# Patient Record
Sex: Female | Born: 1955 | Race: White | Hispanic: No | Smoking: Current every day smoker
Health system: Southern US, Community
[De-identification: ages and names within clinical notes are randomized; demographics above are authoritative.]

## PROBLEM LIST (undated history)

## (undated) DIAGNOSIS — J439 Emphysema, unspecified: Secondary | ICD-10-CM

## (undated) DIAGNOSIS — F1910 Other psychoactive substance abuse, uncomplicated: Secondary | ICD-10-CM

## (undated) DIAGNOSIS — F419 Anxiety disorder, unspecified: Secondary | ICD-10-CM

## (undated) DIAGNOSIS — J449 Chronic obstructive pulmonary disease, unspecified: Secondary | ICD-10-CM

## (undated) HISTORY — PX: BACK SURGERY: SHX140

## (undated) HISTORY — DX: Other psychoactive substance abuse, uncomplicated: F19.10

## (undated) HISTORY — PX: KNEE SURGERY: SHX244

## (undated) HISTORY — DX: Anxiety disorder, unspecified: F41.9

---

## 1998-01-22 ENCOUNTER — Ambulatory Visit (HOSPITAL_COMMUNITY): Admission: RE | Admit: 1998-01-22 | Discharge: 1998-01-22 | Payer: Self-pay | Admitting: Gastroenterology

## 2000-04-19 ENCOUNTER — Other Ambulatory Visit: Admission: RE | Admit: 2000-04-19 | Discharge: 2000-04-19 | Payer: Self-pay | Admitting: Obstetrics and Gynecology

## 2000-04-21 ENCOUNTER — Encounter: Payer: Self-pay | Admitting: Obstetrics and Gynecology

## 2000-04-21 ENCOUNTER — Encounter: Admission: RE | Admit: 2000-04-21 | Discharge: 2000-04-21 | Payer: Self-pay | Admitting: Obstetrics and Gynecology

## 2001-08-15 ENCOUNTER — Other Ambulatory Visit: Admission: RE | Admit: 2001-08-15 | Discharge: 2001-08-15 | Payer: Self-pay | Admitting: Obstetrics and Gynecology

## 2001-08-23 ENCOUNTER — Encounter: Admission: RE | Admit: 2001-08-23 | Discharge: 2001-08-23 | Payer: Self-pay | Admitting: Obstetrics and Gynecology

## 2001-08-23 ENCOUNTER — Encounter: Payer: Self-pay | Admitting: Obstetrics and Gynecology

## 2001-09-01 ENCOUNTER — Encounter (INDEPENDENT_AMBULATORY_CARE_PROVIDER_SITE_OTHER): Payer: Self-pay | Admitting: Specialist

## 2001-09-01 ENCOUNTER — Ambulatory Visit (HOSPITAL_COMMUNITY): Admission: RE | Admit: 2001-09-01 | Discharge: 2001-09-01 | Payer: Self-pay | Admitting: Obstetrics and Gynecology

## 2003-12-04 ENCOUNTER — Ambulatory Visit (HOSPITAL_COMMUNITY): Admission: RE | Admit: 2003-12-04 | Discharge: 2003-12-04 | Payer: Self-pay | Admitting: Family Medicine

## 2004-02-06 ENCOUNTER — Other Ambulatory Visit: Admission: RE | Admit: 2004-02-06 | Discharge: 2004-02-06 | Payer: Self-pay | Admitting: Obstetrics and Gynecology

## 2004-02-21 ENCOUNTER — Encounter: Admission: RE | Admit: 2004-02-21 | Discharge: 2004-02-21 | Payer: Self-pay | Admitting: Obstetrics and Gynecology

## 2007-04-10 ENCOUNTER — Encounter: Admission: RE | Admit: 2007-04-10 | Discharge: 2007-04-10 | Payer: Self-pay | Admitting: Family Medicine

## 2007-06-16 ENCOUNTER — Ambulatory Visit (HOSPITAL_COMMUNITY): Admission: RE | Admit: 2007-06-16 | Discharge: 2007-06-16 | Payer: Self-pay | Admitting: Orthopaedic Surgery

## 2008-05-06 ENCOUNTER — Emergency Department (HOSPITAL_COMMUNITY): Admission: EM | Admit: 2008-05-06 | Discharge: 2008-05-06 | Payer: Self-pay | Admitting: Emergency Medicine

## 2008-07-16 ENCOUNTER — Emergency Department (HOSPITAL_COMMUNITY): Admission: EM | Admit: 2008-07-16 | Discharge: 2008-07-16 | Payer: Self-pay | Admitting: Emergency Medicine

## 2010-09-29 LAB — DIFFERENTIAL
Basophils Relative: 1 % (ref 0–1)
Eosinophils Absolute: 0.2 10*3/uL (ref 0.0–0.7)
Eosinophils Relative: 3 % (ref 0–5)
Lymphocytes Relative: 30 % (ref 12–46)
Lymphs Abs: 2.4 10*3/uL (ref 0.7–4.0)
Monocytes Absolute: 0.6 10*3/uL (ref 0.1–1.0)
Neutro Abs: 4.8 10*3/uL (ref 1.7–7.7)

## 2010-09-29 LAB — POCT I-STAT, CHEM 8
BUN: 6 mg/dL (ref 6–23)
Chloride: 103 mEq/L (ref 96–112)
Creatinine, Ser: 0.7 mg/dL (ref 0.4–1.2)
Glucose, Bld: 104 mg/dL — ABNORMAL HIGH (ref 70–99)
HCT: 47 % — ABNORMAL HIGH (ref 36.0–46.0)
Hemoglobin: 16 g/dL — ABNORMAL HIGH (ref 12.0–15.0)
Potassium: 3.9 mEq/L (ref 3.5–5.1)
Sodium: 137 mEq/L (ref 135–145)
TCO2: 25 mmol/L (ref 0–100)

## 2010-09-29 LAB — CBC
MCV: 94.9 fL (ref 78.0–100.0)
Platelets: 235 10*3/uL (ref 150–400)

## 2010-09-29 LAB — POCT CARDIAC MARKERS: Myoglobin, poc: 45 ng/mL (ref 12–200)

## 2010-10-27 NOTE — Op Note (Signed)
Darlene [name], [name]            ACCOUNT NO.:  [id number]   MEDICAL RECORD NO.:  [id number]          PATIENT TYPE:  AMB   LOCATION:  SDS                          FACILITY:  [facility]   PHYSICIAN:  Vanita Panda. Magnus Ivan, M.D.DATE OF BIRTH:  1956-02-01   DATE OF PROCEDURE:  06/16/2007  DATE OF DISCHARGE:                               OPERATIVE REPORT   PREOPERATIVE DIAGNOSIS:  Right knee internal derangement with likely  medial meniscal tear.   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   PROCEDURE:  1. Right knee arthroscopy with debridement.  2. Right knee partial lateral meniscectomy.  3. Chondroplasty medial and lateral femoral condyles right knee.   SURGEON:  Doneen Poisson, MD   ANESTHESIA:  General.   TOURNIQUET TIME:  Thirty-nine minutes.   BLOOD LOSS:  Minimal.   COMPLICATIONS:  None.   INDICATIONS:  Ms. Darlene [name] is a 55 year old with two previous knee  arthroscopies on her right knee and she developed a locked knee at some  point early last week and she could not fully extend it.  She was seen  in the clinic and agreed that she did have a knee that I could not fully  extend.  I was concerned that it was more of a medial meniscal tear.  She wished to proceed with arthroscopic surgery and we set it up for  this week because she could not be accommodated last week per the  patient.  Risks, benefits of surgery were explained to her and well  understood.  She agreed to proceed with surgery.   PROCEDURE:  Informed consent was obtained, the appropriate right leg was  marked.  She was brought to the operating room and placed supine on the  operating table.  General anesthesia was then obtained and a nonsterile  tourniquet was placed on her upper right thigh and leg was prepped and  draped with DuraPrep and sterile drapes including a sterile stockinette.  A lateral leg post was  utilizing with the bed raised.  The leg was  flexed off the side of the table.  A time-out was called and she was  identified as the correct patient and correct extremity.  I then made an  anterior lateral portal just off the inferior pole of the patella  lateral to the patellar tendon and arthroscopic cannula was inserted and  a large effusion was encountered.  I then inserted the arthroscope into  the knee and right away made a medial portal.  You could see that there  was grade 3 chondromalacia of the medial femoral condyle.  The medial  meniscus was completely debrided away from the previous surgeries.  I  probed this and there was nothing blocking the joint.  I then went to  the middle of the knee at the notch and the ACL pretty much had mucoid  degeneration.  You could see though however, there was a large posterior  bucket-handle tear of the lateral meniscus that was blocking the joint.  With the knee then brought into figure four position I  made a separate  more anterior type of incision to manage the scope and through the  lateral incision used up cutting biters as well as a shaver and  performed a partial lateral meniscectomy with removing most of the  posterior aspect of the lateral meniscus.  I then put the knee through  range of motion.  It was found to be stable and there was no block to  the motion.  There was finally in the patellofemoral joint there was  minimal degenerative changes.  All instrumentation was removed and I  allowed fluid to lavage through the knee.  I then drained the knee of  any excess fluid, closed the portal sites with interrupted 4-0 nylon  suture and placed a Marcaine morphine injection into the knee as well as  the portal sites.  Xeroform followed by well-padded sterile dressing and  Ace wrap were applied.  Tourniquet was let down at 39 minutes.  The toes  did pink up nicely.  The patient was awakened, extubated and taken to  recovery room in stable  condition.  All final counts were correct.  There were no complications noted.      Vanita Panda. Magnus Ivan, M.D.  Electronically Signed     CYB/MEDQ  D:  06/16/2007  T:  06/16/2007  Job:  [id number]

## 2010-10-30 NOTE — Op Note (Signed)
[facility]  Patient:    Darlene [name], Darlene [name] Visit Number: [id number] MRN: [id number]          Service Type: DSU Location: [facility] Attending Physician:  Jenean Lindau Dictated by:   Laqueta Linden, M.D. Proc. Date: 09/01/00 Admit Date:  09/01/2001                             Operative Report  PREOPERATIVE DIAGNOSES:       Dysfunctional uterine bleeding, endometrial polyp.  POSTOPERATIVE DIAGNOSES:      Dysfunctional uterine bleeding, endometrial polyp.  PROCEDURE:                    Hysteroscopic resection.  SURGEON:                      Laqueta Linden, M.D.  ANESTHESIA:                   General LMA.  ESTIMATED BLOOD LOSS:         Less than 10 cc.  FLUIDS:                       Sorbitol net intake 50 cc.  COMPLICATIONS:                None.  INDICATIONS:                  Darlene [name] is a 55 year old female with a greater than one year history of menometrorrhagia.  She was noted on sonohysterogram greater than one year ago to have multiple endometrial polyps but she deferred treatment of this.  She has continued to have erratic and heavy bleeding.  She underwent follow-up ultrasound with sonohysterogram which revealed a 4-5 mm anterior fundal endometrial polyp with a second polyp versus synechiae.  The patient was offered alternatives of hormonal management with progesterone therapy versus resection and elected to proceed to resection. She is aware that at her age there may be hormonal issues contributing to her dysfunctional uterine bleeding and she may require hormonal cycling as well. She has seen the informed consent film.  Been counseled as to the risks, benefits, alternatives, and complications and agrees to proceed.  PROCEDURE:                    The patient was taken to the operating room and after proper identification and consents were ascertained, she was placed on the operating table in supine position.  After general LMA  anesthesia was introduced, she was placed in the [place] stirrups and the perineum and vagina were prepped and draped in a routine sterile fashion.  A transurethral Foley was placed which was removed at the conclusion of the procedure.  Bimanual examination confirmed an anterior, mobile, normal sized uterus.  A speculum was placed in the vagina and the anterior lip of the cervix grasped with a single tooth tenaculum.  The endometrial cavity sounded to 8 cm.  The internal os was gently dilated to a #33 Pratt dilator.  The resectoscope with continuous video and Sorbitol infusion was then inserted under direct vision. The endocervical canal was free of lesions.  Both cornua were visualized. There were posterior synechiae visualized.  There was no clear cut polyp visualized, although the anterior fundus did appear slightly thicker than the remainder of the cavity.  A resectoscope was  placed on routine settings and all walls of the endometrial cavity were resected in strips.  Several small bleeding points were cauterized.  There was no active bleeding noted.  All specimens were sent to pathology.  The instruments were removed.  The tenaculum site was hemostatic.  There was no obvious bleeding from the cervix. Estimated blood loss was less than 10 cc.  Sorbitol net intake 50 cc. Complications:  None.  Patient was stable and extubated on transfer to the recovery room.  She will be observed at discharge per anesthesia protocol. She is to take Advil or Aleve as needed for any cramping and to follow up in the office as scheduled on October 03, 2001.  She is to call prior to that time for excessive pain, fever, bleeding, or other concerns. Dictated by:   Laqueta Linden, M.D. Attending Physician:  Jenean Lindau DD:  09/01/01 TD:  09/04/01 Job: [id number]

## 2011-03-19 LAB — BASIC METABOLIC PANEL
BUN: 7
Chloride: 105
Creatinine, Ser: 0.57
GFR calc non Af Amer: >60
Glucose, Bld: 99

## 2011-03-19 LAB — CBC
Hemoglobin: 15.1 — ABNORMAL HIGH
MCV: 97.2
RBC: 4.6
RDW: 12.7

## 2012-03-14 ENCOUNTER — Emergency Department (HOSPITAL_COMMUNITY)
Admission: EM | Admit: 2012-03-14 | Discharge: 2012-03-14 | Disposition: A | Discharge status: Home / Self Care | Payer: BC Managed Care – PPO | Attending: Emergency Medicine | Admitting: Emergency Medicine

## 2012-03-14 ENCOUNTER — Encounter (HOSPITAL_COMMUNITY): Payer: Self-pay | Admitting: Emergency Medicine

## 2012-03-14 DIAGNOSIS — Z885 Allergy status to narcotic agent status: Secondary | ICD-10-CM | POA: Insufficient documentation

## 2012-03-14 DIAGNOSIS — Z79899 Other long term (current) drug therapy: Secondary | ICD-10-CM | POA: Insufficient documentation

## 2012-03-14 DIAGNOSIS — R109 Unspecified abdominal pain: Secondary | ICD-10-CM

## 2012-03-14 DIAGNOSIS — J438 Other emphysema: Secondary | ICD-10-CM | POA: Insufficient documentation

## 2012-03-14 DIAGNOSIS — F17200 Nicotine dependence, unspecified, uncomplicated: Secondary | ICD-10-CM | POA: Insufficient documentation

## 2012-03-14 DIAGNOSIS — R1013 Epigastric pain: Secondary | ICD-10-CM | POA: Insufficient documentation

## 2012-03-14 HISTORY — DX: Chronic obstructive pulmonary disease, unspecified: J44.9

## 2012-03-14 HISTORY — DX: Emphysema, unspecified: J43.9

## 2012-03-14 LAB — COMPREHENSIVE METABOLIC PANEL
ALT: 12 U/L (ref 0–35)
AST: 20 U/L (ref 0–37)
Albumin: 3.9 g/dL (ref 3.5–5.2)
Alkaline Phosphatase: 72 U/L (ref 39–117)
BUN: 5 mg/dL — ABNORMAL LOW (ref 6–23)
CO2: 28 mEq/L (ref 19–32)
Calcium: 9.4 mg/dL (ref 8.4–10.5)
Chloride: 97 mEq/L (ref 96–112)
Creatinine, Ser: 0.58 mg/dL (ref 0.50–1.10)
GFR calc Af Amer: >90 mL/min (ref >90)
GFR calc non Af Amer: >90 mL/min (ref >90)
Glucose, Bld: 108 mg/dL — ABNORMAL HIGH (ref 70–99)
Potassium: 3.5 mEq/L (ref 3.5–5.1)
Sodium: 136 mEq/L (ref 135–145)
Total Bilirubin: 1.5 mg/dL — ABNORMAL HIGH (ref 0.3–1.2)
Total Protein: 7.1 g/dL (ref 6.0–8.3)

## 2012-03-14 LAB — CBC
HCT: 49.5 % — ABNORMAL HIGH (ref 36.0–46.0)
Hemoglobin: 17.4 g/dL — ABNORMAL HIGH (ref 12.0–15.0)
MCH: 33.4 pg (ref 26.0–34.0)
MCHC: 35.2 g/dL (ref 30.0–36.0)
MCV: 95 fL (ref 78.0–100.0)
Platelets: 232 10*3/uL (ref 150–400)
RBC: 5.21 MIL/uL — ABNORMAL HIGH (ref 3.87–5.11)
RDW: 12.5 % (ref 11.5–15.5)
WBC: 8.4 10*3/uL (ref 4.0–10.5)

## 2012-03-14 LAB — TROPONIN I: Troponin I: <0.3 ng/mL (ref <0.30)

## 2012-03-14 LAB — LIPASE, BLOOD: Lipase: 16 U/L (ref 11–59)

## 2012-03-14 MED ORDER — PANTOPRAZOLE SODIUM 40 MG IV SOLR
40.0000 mg | Freq: Once | INTRAVENOUS | Status: AC
  Administered 2012-03-14: 40 mg via INTRAVENOUS
  Filled 2012-03-14: qty 40

## 2012-03-14 MED ORDER — SODIUM CHLORIDE 0.9 % IV BOLUS (SEPSIS)
1000.0000 mL | Freq: Once | INTRAVENOUS | Status: AC
  Administered 2012-03-14: 1000 mL via INTRAVENOUS

## 2012-03-14 MED ORDER — PANTOPRAZOLE SODIUM 40 MG PO TBEC: 40.0000 mg | DELAYED_RELEASE_TABLET | Freq: Every day | ORAL | Status: DC

## 2012-03-14 MED ORDER — ONDANSETRON HCL 4 MG/2ML IJ SOLN
4.0000 mg | Freq: Once | INTRAMUSCULAR | Status: AC
  Administered 2012-03-14: 4 mg via INTRAVENOUS
  Filled 2012-03-14: qty 2

## 2012-03-14 MED ORDER — HYDROMORPHONE HCL PF 1 MG/ML IJ SOLN
0.5000 mg | Freq: Once | INTRAMUSCULAR | Status: AC
  Administered 2012-03-14: 0.5 mg via INTRAVENOUS
  Filled 2012-03-14: qty 1

## 2012-03-14 NOTE — ED Notes (Signed)
Pt states that she has been having mid back pain that radiates to her stomach.  Has been having this pain for 4 or 5 weeks but it has gotten worse since Friday.  States she is nauseated and not able to eat.

## 2012-03-19 NOTE — ED Provider Notes (Signed)
History    56 year old female with abdominal pain. Epigastric with radiation to her back. Onset about 4-5 weeks ago but acutely worsens Friday. Nausea but no vomiting. Patient states that the pain is worse shortly after eating. No other appreciable exacerbating relieving factors. No urinary complaints. Denies singificant ETOH. Is a smoker.   CSN: [id number]  Arrival date & time 03/14/12  1053   First MD Initiated Contact with Patient 03/14/12 1110      Chief Complaint  Patient presents with  . Back Pain  . Abdominal Pain    (Consider location/radiation/quality/duration/timing/severity/associated sxs/prior treatment) HPI  Past Medical History  Diagnosis Date  . COPD (chronic obstructive pulmonary disease)   . Emphysema     Past Surgical History  Procedure Date  . Back surgery   . Knee surgery     History reviewed. No pertinent family history.  History  Substance Use Topics  . Smoking status: Current Every Day Smoker -- 2.0 packs/day  . Smokeless tobacco: Not on file  . Alcohol Use: 1.8 oz/week    3 Cans of beer per week    OB History    Grav Para Term Preterm Abortions TAB SAB Ect Mult Living                  Review of Systems   Review of symptoms negative unless otherwise noted in HPI.   Allergies  Codeine  Home Medications   Current Outpatient Rx  Name Route Sig Dispense Refill  . ALBUTEROL SULFATE HFA 108 (90 BASE) MCG/ACT IN AERS Inhalation Inhale 2 puffs into the lungs every 6 (six) hours as needed. For shortness of breath.    [place] IN Inhalation Inhale 2 puffs into the lungs 2 (two) times daily.    Marland Kitchen PANTOPRAZOLE SODIUM 40 MG PO TBEC Oral Take 1 tablet (40 mg total) by mouth daily. 30 tablet 0    BP 121/78  Pulse 96  Temp 98.5 F (36.9 C) (Oral)  Resp 26  SpO2 93%  Physical Exam  Nursing note and vitals reviewed. Constitutional: She appears well-developed and well-nourished. No distress.       Laying in bed. No acute distress.    HENT:  Head: Normocephalic and atraumatic.  Eyes: Conjunctivae normal are normal. Right eye exhibits no discharge. Left eye exhibits no discharge.  Neck: Neck supple.  Cardiovascular: Normal rate, regular rhythm and normal heart sounds.  Exam reveals no gallop and no friction rub.   No murmur heard. Pulmonary/Chest: Effort normal and breath sounds normal. No respiratory distress.  Abdominal: Soft. She exhibits no distension. There is tenderness.       Mild epigastric tenderness without rebound or guarding. No distention. No palpable mass.  Genitourinary:       No costovertebral angle tenderness  Musculoskeletal: She exhibits no edema and no tenderness.  Neurological: She is alert.  Skin: Skin is warm and dry.  Psychiatric: She has a normal mood and affect. Her behavior is normal. Thought content normal.    ED Course  Procedures (including critical care time)  Labs Reviewed  CBC - Abnormal; Notable for the following:    RBC 5.21 (*)     Hemoglobin 17.4 (*)     HCT 49.5 (*)     All other components within normal limits  COMPREHENSIVE METABOLIC PANEL - Abnormal; Notable for the following:    Glucose, Bld 108 (*)     BUN 5 (*)     Total Bilirubin 1.5 (*)  All other components within normal limits  LIPASE, BLOOD  TROPONIN I  LAB REPORT - SCANNED   No results found.  EKG:  Rhythm: Normal sinus Rate: 98 Axis: Normal Intervals/conduction: Left atrial enlargement ST segments: Normal   1. Abdominal pain       MDM  56 year old female with abdominal pain. Epigastric. Mild tenderness on palpation. Very low suspicion for emergent surgical process. Imaging was deferred. Consider anginal equivalent but doubt. EKG is non-suggestive. Troponin is normal.. Typical symptoms. Lipase is normal. Patient reports improved symptoms with medications the ER. She is afebrile and hemodynamically stable. Feel she is safe and appropriate for discharge. Will give a prescription for a proton  pump inhibitor for possible gastritis her peptic ulcer. Return precautions were discussed. Outpatient followup otherwise.        Raeford Razor, MD 03/19/12 [phone]

## 2012-04-03 ENCOUNTER — Other Ambulatory Visit: Payer: Self-pay | Admitting: Gastroenterology

## 2012-04-03 DIAGNOSIS — R109 Unspecified abdominal pain: Secondary | ICD-10-CM

## 2012-04-10 ENCOUNTER — Ambulatory Visit
Admission: RE | Admit: 2012-04-10 | Discharge: 2012-04-10 | Disposition: A | Discharge status: Home / Self Care | Payer: BC Managed Care – PPO | Source: Ambulatory Visit | Attending: Gastroenterology | Admitting: Gastroenterology

## 2012-04-10 DIAGNOSIS — R109 Unspecified abdominal pain: Secondary | ICD-10-CM

## 2012-04-10 MED ORDER — IOHEXOL 300 MG/ML  SOLN
100.0000 mL | Freq: Once | INTRAMUSCULAR | Status: AC | PRN
  Administered 2012-04-10: 100 mL via INTRAVENOUS

## 2012-04-24 ENCOUNTER — Other Ambulatory Visit: Payer: Self-pay | Admitting: Gastroenterology

## 2012-04-24 DIAGNOSIS — R109 Unspecified abdominal pain: Secondary | ICD-10-CM

## 2012-04-27 ENCOUNTER — Inpatient Hospital Stay: Admission: RE | Admit: 2012-04-27 | Payer: BC Managed Care – PPO | Source: Ambulatory Visit

## 2012-05-12 ENCOUNTER — Ambulatory Visit
Admission: RE | Admit: 2012-05-12 | Discharge: 2012-05-12 | Disposition: A | Discharge status: Home / Self Care | Payer: BC Managed Care – PPO | Source: Ambulatory Visit | Attending: Gastroenterology | Admitting: Gastroenterology

## 2012-05-12 DIAGNOSIS — R109 Unspecified abdominal pain: Secondary | ICD-10-CM

## 2012-12-29 ENCOUNTER — Ambulatory Visit (INDEPENDENT_AMBULATORY_CARE_PROVIDER_SITE_OTHER)
Admission: RE | Admit: 2012-12-29 | Discharge: 2012-12-29 | Disposition: A | Discharge status: Home / Self Care | Payer: BC Managed Care – PPO | Source: Ambulatory Visit | Attending: Internal Medicine | Admitting: Internal Medicine

## 2012-12-29 ENCOUNTER — Ambulatory Visit (INDEPENDENT_AMBULATORY_CARE_PROVIDER_SITE_OTHER): Payer: BC Managed Care – PPO | Admitting: Internal Medicine

## 2012-12-29 ENCOUNTER — Encounter: Payer: Self-pay | Admitting: Internal Medicine

## 2012-12-29 VITALS — BP 148/90 | HR 97 | Temp 98.2°F | Ht 63.5 in | Wt 92.6 lb

## 2012-12-29 DIAGNOSIS — J441 Chronic obstructive pulmonary disease with (acute) exacerbation: Secondary | ICD-10-CM

## 2012-12-29 DIAGNOSIS — J449 Chronic obstructive pulmonary disease, unspecified: Secondary | ICD-10-CM | POA: Insufficient documentation

## 2012-12-29 DIAGNOSIS — F17200 Nicotine dependence, unspecified, uncomplicated: Secondary | ICD-10-CM

## 2012-12-29 NOTE — Progress Notes (Signed)
  Subjective:    Patient ID: Darlene [name], female    DOB: [birth year], 57 y.o.   MRN: [id number]  HPI  35 yowf smoker dx with remote dx copd  By Clance self referred to pulmonary clinic 12/29/2012 with progressive doe x 06/2012   12/29/2012 1st pulmonary eval in EPIC era / Aya Geisel  cc indolent onset progressive doe x 6 month getting groceries from car to [employer],  crossing parking lots. In am lots watery nasal congestion and cough > 30 min each am, assoc with chest discomfort midline during cough, prod sev tbsp white mucus,, some better p saba uses 4-5 x day.     No obvious daytime or seasonal variabilty or assoc  Ex or lateralizing pleuritic cp or chest tightness, subjective wheeze overt sinus or hb symptoms. No unusual exp hx or h/o childhood pna/ asthma or knowledge of premature birth.    Sleeping ok without nocturnal  or early am exacerbation  of respiratory  c/o's or need for noct saba. Also denies any obvious fluctuation of symptoms with weather or environmental changes or other aggravating or alleviating factors except as outlined above     Review of Systems  Constitutional: Negative for fever, chills and unexpected weight change.  HENT: Positive for congestion. Negative for ear pain, nosebleeds, sore throat, rhinorrhea, sneezing, trouble swallowing, dental problem, voice change, postnasal drip and sinus pressure.   Eyes: Negative for visual disturbance.  Respiratory: Positive for cough and shortness of breath. Negative for choking.   Cardiovascular: Positive for chest pain. Negative for leg swelling.  Gastrointestinal: Positive for abdominal pain. Negative for vomiting and diarrhea.  Genitourinary: Negative for difficulty urinating.  Musculoskeletal: Negative for arthralgias.  Skin: Negative for rash.  Neurological: Negative for tremors, syncope and headaches.  Hematological: Does not bruise/bleed easily.       Objective:   Physical Exam   amb wf nad Wt Readings from Last 3  Encounters:  12/29/12 92 lb 9.6 oz (42.003 kg)     HEENT mild turbinate edema.  Oropharynx no thrush or excess pnd or cobblestoning.  No JVD or cervical adenopathy. Mild accessory muscle hypertrophy. Trachea midline, nl thryroid. Chest was hyperinflated by percussion with diminished breath sounds and moderate increased exp time without wheeze. Hoover sign positive at mid inspiration. Regular rate and rhythm without murmur gallop or rub or increase P2 or edema.  Abd: no hsm, nl excursion. Ext warm without cyanosis or clubbing.    CXR  12/29/2012 :  1. Chronic changes of COPD redemonstrated, as above, without radiographic evidence of acute cardiopulmonary disease.       Assessment & Plan:

## 2012-12-29 NOTE — Patient Instructions (Addendum)
Continue symbicort 160 Take 2 puffs first thing in am and then another 2 puffs about 12 hours later.    Only use your albuterol as a rescue medication to be used if you can't catch your breath by resting or doing a relaxed purse lip breathing pattern. The less you use it, the better it will work when you need it. Ok to use up to 2 puffs every 4 hours  Work on inhaler technique:  relax and gently blow all the way out then take a nice smooth deep breath back in, triggering the inhaler at same time you start breathing in.  Hold for up to 5 seconds if you can.  Rinse and gargle with water when done   If your mouth or throat starts to bother you,   I suggest you time the inhaler to your dental care and after using the inhaler(s) brush teeth and tongue with a baking soda containing toothpaste and when you rinse this out, gargle with it first to see if this helps your mouth and throat.    The key is to stop smoking completely before smoking completely stops you!   Please remember to go to the  x-ray department downstairs for your tests - we will call you with the results when they are available.     Please schedule a follow up office visit in 4 weeks, sooner if needed with pft's

## 2012-12-30 DIAGNOSIS — F17210 Nicotine dependence, cigarettes, uncomplicated: Secondary | ICD-10-CM | POA: Insufficient documentation

## 2012-12-30 NOTE — Assessment & Plan Note (Addendum)
Old chart not available as not seen in EMR era  but needs fresh pft's and alpha one testing if not already done  DDX of  difficult airways managment all start with A and  include Adherence, Ace Inhibitors, Acid Reflux, Active Sinus Disease, Alpha 1 Antitripsin deficiency, Anxiety masquerading as Airways dz,  ABPA,  allergy(esp in young), Aspiration (esp in elderly), Adverse effects of DPI,  Active smokers, plus two Bs  = Bronchiectasis and Beta blocker use..and one C= CHF  Adherence is always the initial "prime suspect" and is a multilayered concern that requires a "trust but verify" approach in every patient - starting with knowing how to use medications, especially inhalers, correctly, keeping up with refills and understanding the fundamental difference between maintenance and prns vs those medications only taken for a very short course and then stopped and not refilled. The proper method of use, as well as anticipated side effects, of a metered-dose inhaler are discussed and demonstrated to the patient. Improved effectiveness after extensive coaching during this visit to a level of approximately  75% so try first symbicort 160 2bid to see if reduce dependency on saba  Active smoking clearly the other big concern> discussed separately  Alpha one > check old chart and send studies next ov if not done    Each maintenance medication was reviewed in detail including most importantly the difference between maintenance and as needed and under what circumstances the prns are to be used.  Please see instructions for details which were reviewed in writing and the patient given a copy.

## 2012-12-30 NOTE — Assessment & Plan Note (Signed)

## 2013-01-02 ENCOUNTER — Encounter: Payer: Self-pay | Admitting: Internal Medicine

## 2013-01-03 NOTE — Progress Notes (Signed)
Quick Note:  Spoke with pt's spouse and notified of results per Dr. Sherene Sires. He verbalized understanding and denied any questions.  ______

## 2013-02-02 ENCOUNTER — Ambulatory Visit (INDEPENDENT_AMBULATORY_CARE_PROVIDER_SITE_OTHER): Payer: BC Managed Care – PPO | Admitting: Internal Medicine

## 2013-02-02 ENCOUNTER — Encounter: Payer: Self-pay | Admitting: Internal Medicine

## 2013-02-02 ENCOUNTER — Other Ambulatory Visit: Payer: BC Managed Care – PPO

## 2013-02-02 VITALS — BP 124/80 | HR 94 | Temp 98.1°F | Ht 63.0 in | Wt 91.0 lb

## 2013-02-02 DIAGNOSIS — J449 Chronic obstructive pulmonary disease, unspecified: Secondary | ICD-10-CM

## 2013-02-02 NOTE — Patient Instructions (Addendum)
Continue symbicort 160 Take 2 puffs first thing in am and then another 2 puffs about 12 hours later.   Only use your albuterol as a rescue medication to be used if you can't catch your breath by resting or doing a relaxed purse lip breathing pattern. The less you use it, the better it will work when you need it.   The key is to stop smoking completely before smoking completely stops you - it's the most important aspect of your care  Please remember to go to the lab  department downstairs for your tests - we will call you with the results when they are available.     Please schedule a follow up visit in 3 months but call sooner if needed

## 2013-02-02 NOTE — Progress Notes (Signed)
PFT done today. 

## 2013-02-02 NOTE — Progress Notes (Signed)
  Subjective:    Patient ID: Darlene [name], female    DOB: 09-Sep-1955    MRN: [id number]    Brief patient profile:  16 yowf smoker dx with remote dx copd  By Clance self referred to pulmonary clinic 12/29/2012 with progressive doe x 06/2012 dx with GOLD II/III COPD 02/02/13      HPI 12/29/2012 1st pulmonary eval in EPIC era / Darlene [name]  cc indolent onset progressive doe x 6 month getting groceries from car to [employer],  crossing parking lots. In am lots watery nasal congestion and cough > 30 min each am, assoc with chest discomfort midline during cough, prod sev tbsp white mucus,, some better p saba uses 4-5 x day.   rec Continue symbicort 160 Take 2 puffs first thing in am and then another 2 puffs about 12 hours later. Only use your albuterol as a rescue medication  Work on inhaler technique:   02/02/2013 f/u ov/Darlene [name] still smoking  Chief Complaint  Patient presents with  . COPD    Breathing is unchanged. Reports SOB, coughing, chest tightness.   not dosing symbicort q 12 and finding needs pro aire around 4 pm daily. Cough is min prod white thick mucus daily worse in am  No obvious daytime or seasonal variabilty or assoc  Ex or lateralizing pleuritic cp or chest tightness, subjective wheeze overt sinus or hb symptoms. No unusual exp hx or h/o childhood pna/ asthma or knowledge of premature birth.    Sleeping ok without nocturnal  or early am exacerbation  of respiratory  c/o's or need for noct saba. Also denies any obvious fluctuation of symptoms with weather or environmental changes or other aggravating or alleviating factors except as outlined above   Current Medications, Allergies, Past Medical History, Past Surgical History, Family History, and Social History were reviewed in [employer] record.  ROS  The following are not active complaints unless bolded sore throat, dysphagia, dental problems, itching, sneezing,  nasal congestion or excess/ purulent secretions, ear  ache,   fever, chills, sweats, unintended wt loss, pleuritic or exertional cp, hemoptysis,  orthopnea pnd or leg swelling, presyncope, palpitations, heartburn, abdominal pain, anorexia, nausea, vomiting, diarrhea  or change in bowel or urinary habits, change in stools or urine, dysuria,hematuria,  rash, arthralgias, visual complaints, headache, numbness weakness or ataxia or problems with walking or coordination,  change in mood/affect or memory.              Objective:   Physical Exam   amb wf nad  Wt Readings from Last 3 Encounters:  02/02/13 91 lb (41.277 kg)  12/29/12 92 lb 9.6 oz (42.003 kg)       HEENT mild turbinate edema.  Oropharynx no thrush or excess pnd or cobblestoning.  No JVD or cervical adenopathy. Mild accessory muscle hypertrophy. Trachea midline, nl thryroid. Chest was hyperinflated by percussion with diminished breath sounds and moderate increased exp time without wheeze. Hoover sign positive at mid inspiration. Regular rate and rhythm without murmur gallop or rub or increase P2 or edema.  Abd: no hsm, nl excursion. Ext warm without cyanosis or clubbing.    CXR  12/29/2012 :  1. Chronic changes of COPD redemonstrated, as above, without radiographic evidence of acute cardiopulmonary disease.       Assessment & Plan:

## 2013-02-03 NOTE — Assessment & Plan Note (Addendum)
-   PFT's 02/02/2013   FEV1 1.28 p B2 = 33% response with ratio 47 and DLCO 66 corrects to 73  - hfa 75 % 12/29/2012  > 75% 02/02/2013  -   alpha testing 02/02/2013 >  I had an extended discussion with the patient today lasting 15 to 20 minutes of a 25 minute visit on the following issues:   I reviewed the Flethcher curve with patient that basically indicates  if you quit smoking when your best day FEV1 is still well preserved (as hers is) it is highly unlikely you will progress to severe disease and informed the patient there was no medication on the market that has proven to change the curve or the likelihood of progression.  Therefore stopping smoking and maintaining abstinence is the most important aspect of care, not choice of inhalers or for that matter, doctors.    I discussed adding a lama next but that this would only "smooth out the ride" and not change the ultimate trajectory and she agreed for now to focus on this issue and regroup in 3 months

## 2013-02-08 ENCOUNTER — Encounter: Payer: Self-pay | Admitting: Internal Medicine

## 2013-02-09 ENCOUNTER — Telehealth: Payer: Self-pay | Admitting: Internal Medicine

## 2013-02-09 MED ORDER — BUDESONIDE-FORMOTEROL FUMARATE 160-4.5 MCG/ACT IN AERO: 1.0000 | INHALATION_SPRAY | Freq: Two times a day (BID) | RESPIRATORY_TRACT | Status: DC

## 2013-02-09 MED ORDER — ALBUTEROL SULFATE HFA 108 (90 BASE) MCG/ACT IN AERS: 2.0000 | INHALATION_SPRAY | Freq: Four times a day (QID) | RESPIRATORY_TRACT | Status: DC | PRN

## 2013-02-09 NOTE — Telephone Encounter (Signed)
rx have been sent to the pharmacy per pts request. Nothing further is needed.

## 2013-02-09 NOTE — Progress Notes (Signed)
Quick Note:  Pt aware ______ 

## 2013-02-13 ENCOUNTER — Encounter: Payer: Self-pay | Admitting: Internal Medicine

## 2013-03-17 ENCOUNTER — Emergency Department (HOSPITAL_BASED_OUTPATIENT_CLINIC_OR_DEPARTMENT_OTHER)
Admission: EM | Admit: 2013-03-17 | Discharge: 2013-03-17 | Disposition: A | Discharge status: Home / Self Care | Payer: BC Managed Care – PPO | Attending: Emergency Medicine | Admitting: Emergency Medicine

## 2013-03-17 ENCOUNTER — Emergency Department (HOSPITAL_BASED_OUTPATIENT_CLINIC_OR_DEPARTMENT_OTHER): Payer: BC Managed Care – PPO

## 2013-03-17 ENCOUNTER — Encounter (HOSPITAL_BASED_OUTPATIENT_CLINIC_OR_DEPARTMENT_OTHER): Payer: Self-pay | Admitting: Emergency Medicine

## 2013-03-17 DIAGNOSIS — J438 Other emphysema: Secondary | ICD-10-CM | POA: Insufficient documentation

## 2013-03-17 DIAGNOSIS — R05 Cough: Secondary | ICD-10-CM | POA: Diagnosis present

## 2013-03-17 DIAGNOSIS — Z79899 Other long term (current) drug therapy: Secondary | ICD-10-CM | POA: Insufficient documentation

## 2013-03-17 DIAGNOSIS — R079 Chest pain, unspecified: Secondary | ICD-10-CM | POA: Diagnosis present

## 2013-03-17 DIAGNOSIS — R059 Cough, unspecified: Secondary | ICD-10-CM | POA: Diagnosis present

## 2013-03-17 DIAGNOSIS — F17200 Nicotine dependence, unspecified, uncomplicated: Secondary | ICD-10-CM | POA: Insufficient documentation

## 2013-03-17 LAB — CBC WITH DIFFERENTIAL/PLATELET
Basophils Absolute: 0 10*3/uL (ref 0.0–0.1)
Basophils Relative: 1 % (ref 0–1)
Eosinophils Absolute: 0.3 10*3/uL (ref 0.0–0.7)
Eosinophils Relative: 4 % (ref 0–5)
HCT: 43.6 % (ref 36.0–46.0)
Hemoglobin: 14.8 g/dL (ref 12.0–15.0)
MCH: 33 pg (ref 26.0–34.0)
MCHC: 33.9 g/dL (ref 30.0–36.0)
MCV: 97.1 fL (ref 78.0–100.0)
Monocytes Relative: 9 % (ref 3–12)
Neutro Abs: 3 10*3/uL (ref 1.7–7.7)
Neutrophils Relative %: 49 % (ref 43–77)
Platelets: 231 10*3/uL (ref 150–400)
RBC: 4.49 MIL/uL (ref 3.87–5.11)
RDW: 12.4 % (ref 11.5–15.5)

## 2013-03-17 LAB — COMPREHENSIVE METABOLIC PANEL
ALT: 8 U/L (ref 0–35)
AST: 17 U/L (ref 0–37)
Albumin: 3.6 g/dL (ref 3.5–5.2)
Alkaline Phosphatase: 62 U/L (ref 39–117)
BUN: 7 mg/dL (ref 6–23)
Calcium: 9.3 mg/dL (ref 8.4–10.5)
Chloride: 104 mEq/L (ref 96–112)
GFR calc Af Amer: >90 mL/min (ref >90)
Glucose, Bld: 100 mg/dL — ABNORMAL HIGH (ref 70–99)
Potassium: 3.7 mEq/L (ref 3.5–5.1)
Sodium: 142 mEq/L (ref 135–145)
Total Protein: 6.8 g/dL (ref 6.0–8.3)

## 2013-03-17 LAB — TROPONIN I: Troponin I: <0.3 ng/mL (ref <0.30)

## 2013-03-17 MED ORDER — SODIUM CHLORIDE 0.9 % IV BOLUS (SEPSIS): 1000.0000 mL | INTRAVENOUS | Status: DC

## 2013-03-17 MED ORDER — ACETAMINOPHEN 325 MG PO TABS
650.0000 mg | ORAL_TABLET | Freq: Once | ORAL | Status: AC
  Administered 2013-03-17: 650 mg via ORAL
  Filled 2013-03-17: qty 2

## 2013-03-17 MED ORDER — METHYLPREDNISOLONE SODIUM SUCC 125 MG IJ SOLR
125.0000 mg | Freq: Once | INTRAMUSCULAR | Status: AC
  Administered 2013-03-17: 125 mg via INTRAVENOUS
  Filled 2013-03-17: qty 2

## 2013-03-17 MED ORDER — SODIUM CHLORIDE 0.9 % IV BOLUS (SEPSIS)
1000.0000 mL | INTRAVENOUS | Status: AC
  Administered 2013-03-17: 1000 mL via INTRAVENOUS

## 2013-03-17 MED ORDER — PREDNISONE 50 MG PO TABS: ORAL_TABLET | ORAL | Status: DC

## 2013-03-17 MED ORDER — ALBUTEROL SULFATE HFA 108 (90 BASE) MCG/ACT IN AERS
6.0000 | INHALATION_SPRAY | Freq: Once | RESPIRATORY_TRACT | Status: AC
  Administered 2013-03-17: 6 via RESPIRATORY_TRACT
  Filled 2013-03-17: qty 6.7

## 2013-03-17 MED ORDER — ALBUTEROL SULFATE HFA 108 (90 BASE) MCG/ACT IN AERS
4.0000 | INHALATION_SPRAY | Freq: Once | RESPIRATORY_TRACT | Status: AC
  Administered 2013-03-17: 4 via RESPIRATORY_TRACT

## 2013-03-17 MED ORDER — DOXYCYCLINE HYCLATE 100 MG PO CAPS: 100.0000 mg | ORAL_CAPSULE | Freq: Two times a day (BID) | ORAL | Status: DC

## 2013-03-17 NOTE — ED Notes (Signed)
Pt has congested cough and cold symptoms x one week.  Pt started to have generalized chest pain, worse under right breast, radiating to back.  No known fever.  Some sob.  Some nausea.  Productive white sputum.

## 2013-03-17 NOTE — ED Provider Notes (Signed)
CSN: [id number]     Arrival date & time 03/17/13  0702 History   First MD Initiated Contact with Patient 03/17/13 [phone]     Chief Complaint  Patient presents with  . Cough  . Chest Pain   (Consider location/radiation/quality/duration/timing/severity/associated sxs/prior Treatment) Patient is a 57 y.o. female presenting with cough and chest pain. The history is provided by the patient.  Cough Cough characteristics:  Productive Sputum characteristics:  Clear Severity:  Mild Onset quality:  Gradual Duration:  6 days Timing:  Constant Progression:  Unchanged Chronicity:  New Smoker: yes   Relieved by:  Nothing Worsened by:  Nothing tried Associated symptoms: chest pain and shortness of breath   Associated symptoms: no fever and no headaches   Chest pain:    Quality:  Sharp   Severity:  Moderate   Onset quality:  Gradual   Duration:  2 days   Timing:  Constant   Progression:  Unchanged   Chronicity:  New Chest Pain Associated symptoms: cough and shortness of breath   Associated symptoms: no abdominal pain, no back pain, no dizziness, no fatigue, no fever, no headache, no nausea and not vomiting     Past Medical History  Diagnosis Date  . COPD (chronic obstructive pulmonary disease)   . Emphysema    Past Surgical History  Procedure Laterality Date  . Back surgery    . Knee surgery     Family History  Problem Relation Age of Onset  . Heart disease Mother    History  Substance Use Topics  . Smoking status: Current Every Day Smoker -- 1.00 packs/day for 42 years    Types: Cigarettes  . Smokeless tobacco: Not on file  . Alcohol Use: 1.8 oz/week    3 Cans of beer per week   OB History   Grav Para Term Preterm Abortions TAB SAB Ect Mult Living                 Review of Systems  Constitutional: Negative for fever and fatigue.  HENT: Negative for congestion, drooling and neck pain.   Eyes: Negative for pain.  Respiratory: Positive for cough and shortness of  breath.   Cardiovascular: Positive for chest pain.  Gastrointestinal: Negative for nausea, vomiting, abdominal pain and diarrhea.  Genitourinary: Negative for dysuria and hematuria.  Musculoskeletal: Negative for back pain and gait problem.  Skin: Negative for color change.  Neurological: Negative for dizziness and headaches.  Hematological: Negative for adenopathy.  Psychiatric/Behavioral: Negative for behavioral problems.  All other systems reviewed and are negative.    Allergies  Codeine  Home Medications   Current Outpatient Rx  Name  Route  Sig  Dispense  Refill  . albuterol (PROAIR HFA) 108 (90 BASE) MCG/ACT inhaler   Inhalation   Inhale 2 puffs into the lungs every 6 (six) hours as needed for wheezing or shortness of breath.   3 each   0   . budesonide-formoterol (SYMBICORT) 160-4.5 MCG/ACT inhaler   Inhalation   Inhale 1 puff into the lungs 2 (two) times daily.   3 Inhaler   0    BP 162/86  Pulse 114  Temp(Src) 98.2 F (36.8 C) (Oral)  Resp 20  Ht 5\' 3"  (1.6 m)  Wt 94 lb (42.638 kg)  BMI 16.66 kg/m2  SpO2 96% Physical Exam  Nursing note and vitals reviewed. Constitutional: She is oriented to person, place, and time. She appears well-developed and well-nourished.  HENT:  Head: Normocephalic.  Mouth/Throat: Oropharynx  is clear and moist. No oropharyngeal exudate.  Eyes: Conjunctivae and EOM are normal. Pupils are equal, round, and reactive to light.  Neck: Normal range of motion. Neck supple.  Cardiovascular: Regular rhythm, normal heart sounds and intact distal pulses.  Exam reveals no gallop and no friction rub.   No murmur heard. HR 110   Pulmonary/Chest: Effort normal. No respiratory distress. She has wheezes (mild wheezing in bilateral lower lung fields).  Abdominal: Soft. Bowel sounds are normal. There is no tenderness. There is no rebound and no guarding.  Musculoskeletal: Normal range of motion. She exhibits no edema and no tenderness.   Neurological: She is alert and oriented to person, place, and time.  Skin: Skin is warm and dry.  Psychiatric: She has a normal mood and affect. Her behavior is normal.    ED Course  Procedures (including critical care time) Labs Review Labs Reviewed  COMPREHENSIVE METABOLIC PANEL - Abnormal; Notable for the following:    Glucose, Bld 100 (*)    All other components within normal limits  CBC WITH DIFFERENTIAL  TROPONIN I   Imaging Review Dg Chest 2 View  03/17/2013   CLINICAL DATA:  Chest pain under right breast. Cough and cold symptoms. Shortness of breath.  EXAM: CHEST  2 VIEW  COMPARISON:  12/29/2012  FINDINGS: Heart size appears normal. The lungs are hyperinflated and there are interstitial changes of COPD/emphysema. No superimposed airspace consolidation identified.  IMPRESSION: 1.  Stable changes of COPD.   Electronically Signed   By: Signa Kell M.D.   On: 03/17/2013 07:54     Date: 03/17/2013  Rate: 110  Rhythm: sinus tachycardia  QRS Axis: normal  Intervals: normal  ST/T Wave abnormalities: normal  Conduction Disutrbances:none  Narrative Interpretation: No ST or T wave changes consistent with ischemia.    Old EKG Reviewed: changes noted   MDM   1. Cough   2. Chest pain    7:25 AM 57 y.o. female presents with cough that began 6 days ago and chest pain. The patient has had a cough productive of clear sputum for 6 days. She denies any fevers. She notes that 2 days ago she began having intermittent diffuse sharp chest pains that became constant one day ago. She states that the pain is unwavering. She notes mild shortness of breath. Wells negative. She is mildly tachycardic here, otherwise her vital signs are unremarkable. Her pain is atypical for cardiac but will get screening lab work and chest x-ray. The patient has mild wheezing on exam, will give a breathing treatment as well. The pt would like tylenol for pain.   9:47 AM: Pt feeling better, would like to go. Low  risk for MACE per HEART score.  I suspect her cp is assoc w/ a mild copd exac as she continues to have mild wheezing on exam. Will start on doxy for mild copd exac. I have discussed the diagnosis/risks/treatment options with the patient and believe the pt to be eligible for discharge home to follow-up with pcp in 2-3 days. We also discussed returning to the ED immediately if new or worsening sx occur. We discussed the sx which are most concerning (e.g., continued or worsening cp or sob, fever) that necessitate immediate return. Any new prescriptions provided to the patient are listed below.  Discharge Medication List as of 03/17/2013  9:49 AM    START taking these medications   Details  doxycycline (VIBRAMYCIN) 100 MG capsule Take 1 capsule (100 mg total) by mouth  2 (two) times daily. One po bid x 7 days, Starting 03/17/2013, Until Discontinued, Print    predniSONE (DELTASONE) 50 MG tablet Take 1 by mouth daily for 4 days., Print         Junius Argyle, MD 03/17/13 1640

## 2013-04-19 ENCOUNTER — Other Ambulatory Visit: Payer: Self-pay

## 2013-05-21 ENCOUNTER — Telehealth: Payer: Self-pay | Admitting: Internal Medicine

## 2013-05-21 NOTE — Telephone Encounter (Signed)
I called and made pt aware we have no samples. Nothing further needed

## 2013-07-16 ENCOUNTER — Ambulatory Visit (INDEPENDENT_AMBULATORY_CARE_PROVIDER_SITE_OTHER): Payer: BC Managed Care – PPO | Admitting: Licensed Clinical Social Worker

## 2013-07-16 DIAGNOSIS — F331 Major depressive disorder, recurrent, moderate: Secondary | ICD-10-CM

## 2013-07-30 ENCOUNTER — Ambulatory Visit: Payer: BC Managed Care – PPO | Admitting: Licensed Clinical Social Worker

## 2014-03-28 ENCOUNTER — Ambulatory Visit
Admission: RE | Admit: 2014-03-28 | Discharge: 2014-03-28 | Disposition: A | Discharge status: Home / Self Care | Payer: BC Managed Care – PPO | Source: Ambulatory Visit | Attending: Family Medicine | Admitting: Family Medicine

## 2014-03-28 ENCOUNTER — Other Ambulatory Visit: Payer: Self-pay | Admitting: Family Medicine

## 2014-03-28 DIAGNOSIS — R9389 Abnormal findings on diagnostic imaging of other specified body structures: Secondary | ICD-10-CM

## 2014-03-28 DIAGNOSIS — R079 Chest pain, unspecified: Secondary | ICD-10-CM

## 2014-03-29 ENCOUNTER — Other Ambulatory Visit: Payer: BC Managed Care – PPO

## 2014-06-17 ENCOUNTER — Other Ambulatory Visit: Payer: Self-pay | Admitting: Family Medicine

## 2014-06-17 DIAGNOSIS — R9389 Abnormal findings on diagnostic imaging of other specified body structures: Secondary | ICD-10-CM

## 2014-07-10 ENCOUNTER — Ambulatory Visit
Admission: RE | Admit: 2014-07-10 | Discharge: 2014-07-10 | Disposition: A | Discharge status: Home / Self Care | Payer: BC Managed Care – PPO | Source: Ambulatory Visit | Attending: Family Medicine | Admitting: Family Medicine

## 2014-07-10 DIAGNOSIS — R918 Other nonspecific abnormal finding of lung field: Secondary | ICD-10-CM | POA: Insufficient documentation

## 2014-07-10 DIAGNOSIS — R9389 Abnormal findings on diagnostic imaging of other specified body structures: Secondary | ICD-10-CM

## 2014-07-16 ENCOUNTER — Encounter: Payer: BC Managed Care – PPO | Admitting: Cardiothoracic Surgery

## 2014-07-23 ENCOUNTER — Encounter: Payer: Self-pay | Admitting: *Deleted

## 2014-07-23 DIAGNOSIS — J449 Chronic obstructive pulmonary disease, unspecified: Secondary | ICD-10-CM | POA: Insufficient documentation

## 2014-07-23 DIAGNOSIS — F1910 Other psychoactive substance abuse, uncomplicated: Secondary | ICD-10-CM | POA: Insufficient documentation

## 2014-07-23 DIAGNOSIS — R911 Solitary pulmonary nodule: Secondary | ICD-10-CM

## 2014-07-23 DIAGNOSIS — F419 Anxiety disorder, unspecified: Secondary | ICD-10-CM | POA: Insufficient documentation

## 2014-07-24 ENCOUNTER — Encounter: Payer: Self-pay | Admitting: Surgery

## 2014-07-24 ENCOUNTER — Institutional Professional Consult (permissible substitution) (INDEPENDENT_AMBULATORY_CARE_PROVIDER_SITE_OTHER): Payer: BC Managed Care – PPO | Admitting: Surgery

## 2014-07-24 VITALS — BP 146/82 | HR 123 | Resp 16 | Ht 64.0 in | Wt 104.0 lb

## 2014-07-24 DIAGNOSIS — D381 Neoplasm of uncertain behavior of trachea, bronchus and lung: Secondary | ICD-10-CM

## 2014-07-24 NOTE — Progress Notes (Signed)
Cardiothoracic Surgery Consultation  PCP is Marjorie Smolder, MD Referring Provider is Inda Merlin Inez Pilgrim, MD  Chief Complaint  Patient presents with  . Lung Lesion    LLLobe...CT CHEST 07/10/14    HPI:  The patient is a 59 year old smoker with a history of COPD who recently had a CT scan of the chest after she presented with pain across her anterior chest. This showed a 9 mm slightly spiculated nodule in the superior segment of the LLL of the lung that was not present on a CT scan of the chest from 07/2008. She had PFT's done in 2014 that showed severe COPD with an FEV1 of 0.98, increasing 30% with bronchodilators.   Past Medical History  Diagnosis Date  . Anxiety   . COPD (chronic obstructive pulmonary disease)   . Emphysema   . Substance abuse     tobacco dependance    Past Surgical History  Procedure Laterality Date  . Back surgery    . Knee surgery      Family History  Problem Relation Age of Onset  . Alzheimer's disease Mother   . Cancer Paternal Aunt     colon    Social History History  Substance Use Topics  . Smoking status: Current Every Day Smoker -- 1.00 packs/day for 42 years    Types: Cigarettes  . Smokeless tobacco: Never Used  . Alcohol Use: 1.8 oz/week    3 Cans of beer per week    Current Outpatient Prescriptions  Medication Sig Dispense Refill  . albuterol (PROAIR HFA) 108 (90 BASE) MCG/ACT inhaler Inhale 2 puffs into the lungs every 6 (six) hours as needed for wheezing or shortness of breath. 3 each 0  . ALPRAZolam (XANAX) 0.25 MG tablet Take 0.25 mg by mouth every 8 (eight) hours as needed for anxiety.    . budesonide-formoterol (SYMBICORT) 160-4.5 MCG/ACT inhaler Inhale 1 puff into the lungs 2 (two) times daily. 3 Inhaler 0  . DULoxetine (CYMBALTA) 20 MG capsule Take 20 mg by mouth daily.    Marland Kitchen tiotropium (SPIRIVA) 18 MCG inhalation capsule Place 18 mcg into inhaler and inhale daily as needed.     No current facility-administered medications  for this visit.    Allergies  Allergen Reactions  . Codeine Nausea And Vomiting    Review of Systems  Constitutional: Negative for fever, activity change, appetite change, fatigue and unexpected weight change.  HENT: Negative.   Eyes: Negative.   Respiratory: Positive for cough and shortness of breath.   Cardiovascular: Positive for chest pain. Negative for palpitations and leg swelling.  Gastrointestinal: Negative.   Endocrine: Negative.   Genitourinary: Negative.   Musculoskeletal: Negative.   Skin: Negative.   Allergic/Immunologic: Negative.   Neurological: Negative.   Hematological: Negative.   Psychiatric/Behavioral: Negative.     BP 146/82 mmHg  Pulse 123  Resp 16  Ht 5\' 4"  (1.626 m)  Wt 104 lb (47.174 kg)  BMI 17.84 kg/m2  SpO2 95% Physical Exam  Constitutional: She is oriented to person, place, and time. She appears well-developed and well-nourished.  Thin woman in no distress  HENT:  Head: Normocephalic and atraumatic.  Mouth/Throat: Oropharynx is clear and moist.  Eyes: EOM are normal. Pupils are equal, round, and reactive to light.  Neck: Normal range of motion. Neck supple. No JVD present. No thyromegaly present.  Cardiovascular: Normal rate, regular rhythm, normal heart sounds and intact distal pulses.   No murmur heard. Pulmonary/Chest: Effort normal. No respiratory distress.  She has wheezes.  Decreased breath sounds throughout  Abdominal: Soft. Bowel sounds are normal. She exhibits no distension and no mass. There is no tenderness.  Musculoskeletal: Normal range of motion. She exhibits no edema.  Lymphadenopathy:    She has no cervical adenopathy.  Neurological: She is alert and oriented to person, place, and time. She has normal strength. No cranial nerve deficit or sensory deficit.  Skin: Skin is warm and dry.  Psychiatric: She has a normal mood and affect.     Diagnostic Tests:  CLINICAL DATA: Productive cough. Abnormal chest radiograph  dated 03/28/2014.  EXAM: CT CHEST WITHOUT CONTRAST  TECHNIQUE: Multidetector CT imaging of the chest was performed following the standard protocol without IV contrast.  COMPARISON: Chest x-ray dated 03/28/2014 and CT scan dated 07/16/2008  FINDINGS: There is a new it 9 mm fairly smooth for a nodule in the posterior superior aspect of the left lower lobe. This is worrisome for a primary carcinoma.  The patient has severe emphysematous changes throughout the lungs but most severe in the upper lobes. There is no evidence of a and right apical mass or other abnormality in the right lung apex. I think the density on chest x-ray represents a relatively dense but normal medial margin of the right first rib.  There is no hilar or mediastinal adenopathy. Heart size is normal. No effusions. No acute osseous abnormality.  IMPRESSION: New 9 mm slightly spiculated nodule in the superior aspect of the left lower lobe worrisome for primary carcinoma of the lung.  Emphysema. No mass lesion or other significant abnormality in the right lung apex. The density on chest x-ray is felt to represent the sclerotic margin of the right first rib.   Electronically Signed  By: Rozetta Nunnery M.D.  On: 07/10/2014 17:24    Impression:  She has a 9 mm nodule in the superior segment of the LLL that is suspicious for a lung cancer. It was not present on the CT from 2010. She has severe COPD and still smokes at least 10 cigarettes per day. I think a PET scan is indicated. If this nodule is hypermetabolic then we will have to consider surgical resection. I think it is too small to get a reliable biopsy at this time. I will also get new PFT's to reassess her lung function.   Plan:  PET scan and PFT's. I will see her back afterward.

## 2014-07-25 ENCOUNTER — Other Ambulatory Visit: Payer: Self-pay | Admitting: *Deleted

## 2014-07-25 DIAGNOSIS — R911 Solitary pulmonary nodule: Secondary | ICD-10-CM

## 2014-08-02 ENCOUNTER — Ambulatory Visit (HOSPITAL_COMMUNITY)
Admission: RE | Admit: 2014-08-02 | Discharge: 2014-08-02 | Disposition: A | Discharge status: Home / Self Care | Payer: BC Managed Care – PPO | Source: Ambulatory Visit | Attending: Surgery | Admitting: Surgery

## 2014-08-02 DIAGNOSIS — J432 Centrilobular emphysema: Secondary | ICD-10-CM | POA: Diagnosis not present

## 2014-08-02 DIAGNOSIS — R911 Solitary pulmonary nodule: Secondary | ICD-10-CM | POA: Insufficient documentation

## 2014-08-02 LAB — PULMONARY FUNCTION TEST
DL/VA % pred: 46 %
DL/VA: 2.18 ml/min/mmHg/L
DLCO UNC: 9.61 ml/min/mmHg
DLCO unc % pred: 41 %
FEF 25-75 Post: 0.43 L/sec
FEF 25-75 Pre: 0.27 L/sec
FEF2575-%Change-Post: 58 %
FEF2575-%PRED-POST: 18 %
FEF2575-%Pred-Pre: 11 %
FEV1-%Change-Post: 15 %
FEV1-%PRED-POST: 39 %
FEV1-%PRED-PRE: 33 %
FEV1-PRE: 0.84 L
FEV1-Post: 0.97 L
FEV1FVC-%Change-Post: -1 %
FEV1FVC-%PRED-PRE: 49 %
FEV6-%Change-Post: 18 %
FEV6-%Pred-Post: 69 %
FEV6-%Pred-Pre: 58 %
FEV6-PRE: 1.8 L
FEV6-Post: 2.13 L
FEV6FVC-%Change-Post: 1 %
FEV6FVC-%PRED-POST: 87 %
FEV6FVC-%PRED-PRE: 86 %
FVC-%Change-Post: 17 %
FVC-%Pred-Post: 79 %
FVC-%Pred-Pre: 67 %
FVC-Post: 2.52 L
FVC-Pre: 2.15 L
POST FEV1/FVC RATIO: 38 %
Post FEV6/FVC ratio: 85 %
Pre FEV1/FVC ratio: 39 %
Pre FEV6/FVC Ratio: 84 %
RV % pred: 154 %
RV: 2.99 L
TLC % pred: 111 %
TLC: 5.44 L

## 2014-08-02 LAB — GLUCOSE, CAPILLARY: GLUCOSE-CAPILLARY: 98 mg/dL (ref 70–99)

## 2014-08-02 MED ORDER — FLUDEOXYGLUCOSE F - 18 (FDG) INJECTION
4.9000 | Freq: Once | INTRAVENOUS | Status: AC | PRN
  Administered 2014-08-02: 4.9 via INTRAVENOUS

## 2014-08-02 MED ORDER — ALBUTEROL SULFATE (2.5 MG/3ML) 0.083% IN NEBU
2.5000 mg | INHALATION_SOLUTION | Freq: Once | RESPIRATORY_TRACT | Status: AC
  Administered 2014-08-02: 2.5 mg via RESPIRATORY_TRACT

## 2014-08-07 ENCOUNTER — Encounter: Payer: Self-pay | Admitting: Surgery

## 2014-08-07 ENCOUNTER — Ambulatory Visit (INDEPENDENT_AMBULATORY_CARE_PROVIDER_SITE_OTHER): Payer: BC Managed Care – PPO | Admitting: Surgery

## 2014-08-07 VITALS — BP 150/89 | HR 110 | Resp 20 | Ht 63.0 in | Wt 104.0 lb

## 2014-08-07 DIAGNOSIS — D381 Neoplasm of uncertain behavior of trachea, bronchus and lung: Secondary | ICD-10-CM

## 2014-08-07 NOTE — Progress Notes (Signed)
HPI:  The patient returns today for follow up of a small lung nodule in the superior segment of the left lower lobe. The PET scan shows a 7 mm rounded nodule in the left lower lobe with a minimal SUV of 0.5. The nodule looks rounder and smoother than it did on the initial CT scan of the chest. Her PFT's show severe COPD with a severely reduced diffusion capacity.   Current Outpatient Prescriptions  Medication Sig Dispense Refill  . albuterol (PROAIR HFA) 108 (90 BASE) MCG/ACT inhaler Inhale 2 puffs into the lungs every 6 (six) hours as needed for wheezing or shortness of breath. 3 each 0  . ALPRAZolam (XANAX) 0.25 MG tablet Take 0.25 mg by mouth every 8 (eight) hours as needed for anxiety.    . budesonide-formoterol (SYMBICORT) 160-4.5 MCG/ACT inhaler Inhale 1 puff into the lungs 2 (two) times daily. 3 Inhaler 0  . DULoxetine (CYMBALTA) 20 MG capsule Take 20 mg by mouth daily.    Marland Kitchen tiotropium (SPIRIVA) 18 MCG inhalation capsule Place 18 mcg into inhaler and inhale daily as needed.     No current facility-administered medications for this visit.     Physical Exam: BP 150/89 mmHg  Pulse 110  Resp 20  Ht 5\' 3"  (1.6 m)  Wt 104 lb (47.174 kg)  BMI 18.43 kg/m2  SpO2 91% She looks well Lungs have decreased breath sounds throughout that are clear Cardiac exam shows a regular rate and rhythm with normal heart sounds.  Diagnostic Tests:  CLINICAL DATA: Initial treatment strategy for pulmonary nodule.  EXAM: NUCLEAR MEDICINE PET SKULL BASE TO THIGH  TECHNIQUE: 4.9 mCi F-18 FDG was injected intravenously. Full-ring PET imaging was performed from the skull base to thigh after the radiotracer. CT data was obtained and used for attenuation correction and anatomic localization.  FASTING BLOOD GLUCOSE: Value: 98 mg/dl  COMPARISON: CT 07/10/2004  FINDINGS: NECK  No hypermetabolic lymph nodes in the neck.  CHEST  Within the superior segment of the left lower lobe  there is a well-circumscribed pulmonary nodule measuring 7 mm with a very low metabolic activity (SUV max 0.5).  There are no additional pulmonary nodules in the lungs. There is centrilobular emphysema in the upper lobes.  No hypermetabolic mediastinal lymph nodes.  ABDOMEN/PELVIS  No abnormal hypermetabolic activity within the liver, pancreas, adrenal glands, or spleen. No hypermetabolic lymph nodes in the abdomen or pelvis.  SKELETON  No focal hypermetabolic activity to suggest skeletal metastasis.  IMPRESSION: 1. Rounded left lower lobe pulmonary nodule with trace metabolic activity. This combination of findings is not typical for bronchogenic carcinoma or carcinoma in situ and favors benign etiology. As this is a new finding, recommend follow-up CT thorax in 3 months. 2. Centrilobular emphysema in the upper lobes.   Electronically Signed  By: Suzy Bouchard M.D.  On: 08/02/2014 09:16     Ref Range 5d ago      FVC-Pre L 2.15    FVC-%Pred-Pre % 67    FVC-Post L 2.52    FVC-%Pred-Post % 79    FVC-%Change-Post % 17    FEV1-Pre L 0.84    FEV1-%Pred-Pre % 33    FEV1-Post L 0.97    FEV1-%Pred-Post % 39    FEV1-%Change-Post % 15    FEV6-Pre L 1.80    FEV6-%Pred-Pre % 58    FEV6-Post L 2.13    FEV6-%Pred-Post % 69    FEV6-%Change-Post % 18    Pre FEV1/FVC ratio % 39  FEV1FVC-%Pred-Pre % 49    Post FEV1/FVC ratio % 38    FEV1FVC-%Change-Post % -1    Pre FEV6/FVC Ratio % 84    FEV6FVC-%Pred-Pre % 86    Post FEV6/FVC ratio % 85    FEV6FVC-%Pred-Post % 87    FEV6FVC-%Change-Post % 1    FEF 25-75 Pre L/sec 0.27    FEF2575-%Pred-Pre % 11    FEF 25-75 Post L/sec 0.43    FEF2575-%Pred-Post % 18    FEF2575-%Change-Post % 58    RV L 2.99    RV % pred % 154    TLC L 5.44    TLC % pred % 111    DLCO unc ml/min/mmHg 9.61    DLCO unc % pred % 41    DL/VA ml/min/mmHg/L 2.18    DL/VA % pred % 46   Resulting  Agency  [facility]      Specimen Collected: 08/02/14 8:45 AM Last Resulted: 08/02/14 12:10 PM                    Impression:  The PET scan is more consistent with a benign nodule than a malignant one since the lesion is round and smooth with minimal hypermetabolic activity. She has bad COPD. I think it would be best to follow this lesion for now. I reviewed the PET scan and PFT's with her and her husband. They understand. I stressed the importance of smoking cessation given her young age and severe COPD.  Plan:  I will see her back in 3 months with a chest CT.

## 2014-10-08 ENCOUNTER — Other Ambulatory Visit: Payer: Self-pay | Admitting: Surgery

## 2014-10-08 DIAGNOSIS — R911 Solitary pulmonary nodule: Secondary | ICD-10-CM

## 2014-10-09 ENCOUNTER — Other Ambulatory Visit: Payer: Self-pay | Admitting: Neurosurgery

## 2014-10-09 DIAGNOSIS — M5136 Other intervertebral disc degeneration, lumbar region: Secondary | ICD-10-CM

## 2014-10-22 ENCOUNTER — Ambulatory Visit
Admission: RE | Admit: 2014-10-22 | Discharge: 2014-10-22 | Disposition: A | Discharge status: Home / Self Care | Payer: BC Managed Care – PPO | Source: Ambulatory Visit | Attending: Neurosurgery | Admitting: Neurosurgery

## 2014-10-22 DIAGNOSIS — M5136 Other intervertebral disc degeneration, lumbar region: Secondary | ICD-10-CM

## 2014-10-22 MED ORDER — GADOBENATE DIMEGLUMINE 529 MG/ML IV SOLN
9.0000 mL | Freq: Once | INTRAVENOUS | Status: AC | PRN
  Administered 2014-10-22: 9 mL via INTRAVENOUS

## 2014-11-06 ENCOUNTER — Ambulatory Visit: Payer: BC Managed Care – PPO | Admitting: Surgery

## 2014-11-06 ENCOUNTER — Other Ambulatory Visit: Payer: BC Managed Care – PPO

## 2014-11-14 ENCOUNTER — Other Ambulatory Visit: Payer: Self-pay | Admitting: Family Medicine

## 2014-11-14 ENCOUNTER — Other Ambulatory Visit (HOSPITAL_COMMUNITY)
Admission: RE | Admit: 2014-11-14 | Discharge: 2014-11-14 | Disposition: A | Discharge status: Home / Self Care | Payer: BC Managed Care – PPO | Source: Ambulatory Visit | Attending: Family Medicine | Admitting: Family Medicine

## 2014-11-14 DIAGNOSIS — Z1151 Encounter for screening for human papillomavirus (HPV): Secondary | ICD-10-CM | POA: Diagnosis present

## 2014-11-14 DIAGNOSIS — Z01419 Encounter for gynecological examination (general) (routine) without abnormal findings: Secondary | ICD-10-CM | POA: Insufficient documentation

## 2014-11-18 LAB — CYTOLOGY - PAP

## 2015-03-11 ENCOUNTER — Other Ambulatory Visit: Payer: Self-pay | Admitting: Internal Medicine

## 2015-03-11 ENCOUNTER — Ambulatory Visit (INDEPENDENT_AMBULATORY_CARE_PROVIDER_SITE_OTHER): Payer: BC Managed Care – PPO | Admitting: Internal Medicine

## 2015-03-11 ENCOUNTER — Ambulatory Visit (INDEPENDENT_AMBULATORY_CARE_PROVIDER_SITE_OTHER): Payer: BC Managed Care – PPO

## 2015-03-11 VITALS — BP 144/76 | HR 100 | Temp 98.7°F | Resp 16 | Ht 62.0 in | Wt 104.0 lb

## 2015-03-11 DIAGNOSIS — S2231XB Fracture of one rib, right side, initial encounter for open fracture: Secondary | ICD-10-CM

## 2015-03-11 DIAGNOSIS — Y30XXXA Falling, jumping or pushed from a high place, undetermined intent, initial encounter: Secondary | ICD-10-CM

## 2015-03-11 DIAGNOSIS — R0789 Other chest pain: Secondary | ICD-10-CM

## 2015-03-11 DIAGNOSIS — Z23 Encounter for immunization: Secondary | ICD-10-CM

## 2015-03-11 MED ORDER — INFLUENZA VAC SPLIT QUAD 0.5 ML IM SUSY: 0.5000 mL | PREFILLED_SYRINGE | INTRAMUSCULAR | Status: DC

## 2015-03-11 MED ORDER — HYDROCODONE-ACETAMINOPHEN 5-325 MG PO TABS: 1.0000 | ORAL_TABLET | Freq: Four times a day (QID) | ORAL | Status: DC | PRN

## 2015-03-11 MED ORDER — IBUPROFEN 600 MG PO TABS: 600.0000 mg | ORAL_TABLET | Freq: Three times a day (TID) | ORAL | Status: DC | PRN

## 2015-03-11 NOTE — Progress Notes (Signed)
Patient ID: Darlene [name], female   DOB: 26-Aug-1955, 59 y.o.   MRN: [id number]   03/11/2015 at 11:26 AM  Darlene [name] / DOB: Jul 27, 1955 / MRN: [id number]  Problem list reviewed and updated by me where necessary.   SUBJECTIVE  Darlene [name] is a 59 y.o. ill appearing female presenting for the chief complaint of fell changing light bulb and injured right ribs 2 weeks ago. Pain is getting worse. Chronically ill appearing female. See problem list,.     She  has a past medical history of Anxiety; COPD (chronic obstructive pulmonary disease); Emphysema; and Substance abuse.    Medications reviewed and updated by myself where necessary, and exist elsewhere in the encounter.   Darlene [name] is allergic to codeine. She  reports that she has been smoking Cigarettes.  She has a 42 pack-year smoking history. She has never used smokeless tobacco. She reports that she drinks about 1.8 oz of alcohol per week. She reports that she does not use illicit drugs. She  has no sexual activity history on file. The patient  has past surgical history that includes Back surgery and Knee surgery.  Her family history includes Alzheimer's disease in her mother; Cancer in her paternal aunt.  Review of Systems  Constitutional: Negative for fever.  Respiratory: Positive for cough. Negative for shortness of breath.   Cardiovascular: Positive for chest pain.  Gastrointestinal: Negative.  Negative for nausea.  Skin: Negative for rash.  Neurological: Negative for dizziness and headaches.    OBJECTIVE  Her  height is '5\' 2"'$  (1.575 m) and weight is 104 lb (47.174 kg). Her oral temperature is 98.7 F (37.1 C). Her blood pressure is 144/76 and her pulse is 100. Her respiration is 16 and oxygen saturation is 94%.  The patient's body mass index is 19.02 kg/(m^2).  Physical Exam  Constitutional: She is oriented to person, place, and time. She appears well-developed and well-nourished.  HENT:  Head: Normocephalic.   Eyes: Conjunctivae are normal. No scleral icterus.  Neck: Normal range of motion.  Cardiovascular: Normal rate.   Respiratory: Effort normal. No accessory muscle usage. Tachypnea noted. No respiratory distress. She has decreased breath sounds. She has wheezes. She has rhonchi. She has no rales. She exhibits tenderness and bony tenderness. She exhibits no mass, no crepitus, no swelling and no retraction.    Rib injury 2 weeks ago point tender and worse.  GI: She exhibits no distension.  Musculoskeletal: Normal range of motion.  Neurological: She is alert and oriented to person, place, and time. Coordination normal.  Skin: Skin is warm and dry.  Psychiatric: She has a normal mood and affect.   [facility] reading (PRIMARY) by  Dr.Guest    No results found for this or any previous visit (from the past 24 hour(s)).  ASSESSMENT & PLAN  There are no diagnoses linked to this encounter.

## 2015-03-11 NOTE — Patient Instructions (Addendum)
Fall Prevention and Home Safety Falls cause injuries and can affect all age groups. It is possible to prevent falls.  HOW TO PREVENT FALLS  Wear shoes with rubber soles that do not have an opening for your toes.  Keep the inside and outside of your house well lit.  Use night lights throughout your home.  Remove clutter from floors.  Clean up floor spills.  Remove throw rugs or fasten them to the floor with carpet tape.  Do not place electrical cords across pathways.  Put grab bars by your tub, shower, and toilet. Do not use towel bars as grab bars.  Put handrails on both sides of the stairway. Fix loose handrails.  Do not climb on stools or stepladders, if possible.  Do not wax your floors.  Repair uneven or unsafe sidewalks, walkways, or stairs.  Keep items you use a lot within reach.  Be aware of pets.  Keep emergency numbers next to the telephone.  Put smoke detectors in your home and near bedrooms. Ask your doctor what other things you can do to prevent falls. Document Released: 03/27/2009 Document Revised: 11/30/2011 Document Reviewed: 08/31/2011 [facility] Patient Information 2015 [place], [place]. This information is not intended to replace advice given to you by your health care provider. Make sure you discuss any questions you have with your health care provider. Rib Fracture A rib fracture is a break or crack in one of the bones of the ribs. The ribs are a group of long, curved bones that wrap around your chest and attach to your spine. They protect your lungs and other organs in the chest cavity. A broken or cracked rib is often painful, but most do not cause other problems. Most rib fractures heal on their own over time. However, rib fractures can be more serious if multiple ribs are broken or if broken ribs move out of place and push against other structures. CAUSES   A direct blow to the chest. For example, this could happen during contact sports, a car accident,  or a fall against a hard object.  Repetitive movements with high force, such as pitching a baseball or having severe coughing spells. SYMPTOMS   Pain when you breathe in or cough.  Pain when someone presses on the injured area. DIAGNOSIS  Your caregiver will perform a physical exam. Various imaging tests may be ordered to confirm the diagnosis and to look for related injuries. These tests may include a chest X-ray, computed tomography (CT), magnetic resonance imaging (MRI), or a bone scan. TREATMENT  Rib fractures usually heal on their own in 1-3 months. The longer healing period is often associated with a continued cough or other aggravating activities. During the healing period, pain control is very important. Medication is usually given to control pain. Hospitalization or surgery may be needed for more severe injuries, such as those in which multiple ribs are broken or the ribs have moved out of place.  HOME CARE INSTRUCTIONS   Avoid strenuous activity and any activities or movements that cause pain. Be careful during activities and avoid bumping the injured rib.  Gradually increase activity as directed by your caregiver.  Only take over-the-counter or prescription medications as directed by your caregiver. Do not take other medications without asking your caregiver first.  Apply ice to the injured area for the first 1-2 days after you have been treated or as directed by your caregiver. Applying ice helps to reduce inflammation and pain.  Put ice in a plastic  bag.  Place a towel between your skin and the bag.   Leave the ice on for 15-20 minutes at a time, every 2 hours while you are awake.  Perform deep breathing as directed by your caregiver. This will help prevent pneumonia, which is a common complication of a broken rib. Your caregiver may instruct you to:  Take deep breaths several times a day.  Try to cough several times a day, holding a pillow against the injured  area.  Use a device called an incentive spirometer to practice deep breathing several times a day.  Drink enough fluids to keep your urine clear or pale yellow. This will help you avoid constipation.   Do not wear a rib belt or binder. These restrict breathing, which can lead to pneumonia.  SEEK IMMEDIATE MEDICAL CARE IF:   You have a fever.   You have difficulty breathing or shortness of breath.   You develop a continual cough, or you cough up thick or bloody sputum.  You feel sick to your stomach (nausea), throw up (vomit), or have abdominal pain.   You have worsening pain not controlled with medications.  MAKE SURE YOU:  Understand these instructions.  Will watch your condition.  Will get help right away if you are not doing well or get worse. Document Released: 05/31/2005 Document Revised: 01/31/2013 Document Reviewed: 08/02/2012 [facility] Patient Information 2015 [place], [place]. This information is not intended to replace advice given to you by your health care provider. Make sure you discuss any questions you have with your health care provider.

## 2015-03-11 NOTE — Progress Notes (Signed)
   Subjective:    Patient ID: Darlene [name], female    DOB: [birth year], 59 y.o.   MRN: [id number]  HPI    Review of Systems     Objective:   Physical Exam   [facility] reading (PRIMARY) by  Dr.Peggi Yono one fractured rib, none displaced.       Assessment & Plan:

## 2015-04-22 ENCOUNTER — Ambulatory Visit (INDEPENDENT_AMBULATORY_CARE_PROVIDER_SITE_OTHER): Payer: BC Managed Care – PPO

## 2015-04-22 ENCOUNTER — Telehealth: Payer: Self-pay | Admitting: Family Medicine

## 2015-04-22 ENCOUNTER — Encounter: Payer: Self-pay | Admitting: Physician Assistant

## 2015-04-22 ENCOUNTER — Inpatient Hospital Stay (HOSPITAL_BASED_OUTPATIENT_CLINIC_OR_DEPARTMENT_OTHER): Admission: RE | Admit: 2015-04-22 | Payer: BC Managed Care – PPO | Source: Ambulatory Visit

## 2015-04-22 ENCOUNTER — Ambulatory Visit (INDEPENDENT_AMBULATORY_CARE_PROVIDER_SITE_OTHER): Payer: BC Managed Care – PPO | Admitting: Physician Assistant

## 2015-04-22 VITALS — BP 140/80 | HR 112 | Temp 98.2°F | Resp 20 | Ht 62.0 in | Wt 102.0 lb

## 2015-04-22 DIAGNOSIS — Z8709 Personal history of other diseases of the respiratory system: Secondary | ICD-10-CM | POA: Diagnosis not present

## 2015-04-22 DIAGNOSIS — M25471 Effusion, right ankle: Secondary | ICD-10-CM

## 2015-04-22 DIAGNOSIS — R64 Cachexia: Secondary | ICD-10-CM

## 2015-04-22 DIAGNOSIS — R000 Tachycardia, unspecified: Secondary | ICD-10-CM

## 2015-04-22 LAB — POCT CBC
Granulocyte percent: 67.6 %G (ref 37–80)
HCT, POC: 44.3 % (ref 37.7–47.9)
HEMOGLOBIN: 14.3 g/dL (ref 12.2–16.2)
LYMPH, POC: 2.1 (ref 0.6–3.4)
MCH, POC: 32.1 pg — AB (ref 27–31.2)
MCHC: 32.3 g/dL (ref 31.8–35.4)
MCV: 99.2 fL — AB (ref 80–97)
MID (cbc): 0.4 (ref 0–0.9)
MPV: 7.4 fL (ref 0–99.8)
POC GRANULOCYTE: 5.1 (ref 2–6.9)
POC LYMPH PERCENT: 27 %L (ref 10–50)
POC MID %: 5.4 %M (ref 0–12)
Platelet Count, POC: 226 10*3/uL (ref 142–424)
RBC: 4.47 M/uL (ref 4.04–5.48)
RDW, POC: 14 %
WBC: 7.6 10*3/uL (ref 4.6–10.2)

## 2015-04-22 LAB — D-DIMER, QUANTITATIVE: D-Dimer, Quant: 0.84 ug/mL-FEU — ABNORMAL HIGH (ref 0.00–0.48)

## 2015-04-22 LAB — URIC ACID: Uric Acid, Serum: 3.2 mg/dL (ref 2.4–7.0)

## 2015-04-22 NOTE — Progress Notes (Signed)
04/22/2015 at [place] / DOB: [birth year] / MRN: [id number]  The patient has COPD GOLD II; Smoker; Cough; Chest pain; Anxiety; COPD (chronic obstructive pulmonary disease) ([place]); Substance abuse; and Lesion of left lung on her problem list.  SUBJECTIVE  Darlene [name] is a 59 y.o. female with a history of COPD and tachycardia who complains of left sided ankle pain medial ankle pain along the deltoid ligament. Pain started ted days ago and she associates some swelling which in improving. She denies any trauma to the joint. Reports that it started after taking a nap in her recliner.  She reports the swelling is improving and she denies posterior calf pain and has mild pain with ambulation.  She has a history of osteoporosis.   She has a long history of COPD and a 43 pack year history of smoking.  She is under the care of her PCP for this problem.   She  has a past medical history of Anxiety; COPD (chronic obstructive pulmonary disease) ([place]); Emphysema; and Substance abuse.    Medications reviewed and updated by myself where necessary, and exist elsewhere in the encounter.   Darlene [name] is allergic to codeine. She  reports that she has been smoking Cigarettes.  She has a 42 pack-year smoking history. She has never used smokeless tobacco. She reports that she drinks about 1.8 oz of alcohol per week. She reports that she does not use illicit drugs. She  has no sexual activity history on file. The patient  has past surgical history that includes Back surgery and Knee surgery.  Her family history includes Alzheimer's disease in her mother; Cancer in her paternal aunt.  Review of Systems  Constitutional: Negative for fever and chills.  Respiratory: Negative for shortness of breath.   Cardiovascular: Negative for chest pain.  Gastrointestinal: Negative for nausea and abdominal pain.  Genitourinary: Negative.   Skin: Negative for rash.  Neurological: Negative for dizziness and  headaches.    OBJECTIVE  Her  height is '5\' 2"'$  (1.575 m) and weight is 102 lb (46.267 kg). Her oral temperature is 98.2 F (36.8 C). Her blood pressure is 140/80 and her pulse is 112. Her respiration is 20 and oxygen saturation is 95%.  The patient's body mass index is 18.65 kg/(m^2).  Physical Exam  Constitutional: She appears well-developed.  Cardiovascular:  No extrasystoles are present. Tachycardia present.   Musculoskeletal:       Feet:  Skin: She is not diaphoretic.    [facility] reading (PRIMARY) by  PA Zada Haser: Stat read please comment.   Results for orders placed or performed in visit on 04/22/15 (from the past 24 hour(s))  D-dimer, quantitative (not at [facility])     Status: Abnormal   Collection Time: 04/22/15  3:28 PM  Result Value Ref Range   D-Dimer, Quant 0.84 (H) 0.00 - 0.48 ug/mL-FEU   Narrative   Performed at:  [address], [address]                [place], [place] [address]  POCT CBC     Status: Abnormal   Collection Time: 04/22/15  3:47 PM  Result Value Ref Range   WBC 7.6 4.6 - 10.2 K/uL   Lymph, poc 2.1 0.6 - 3.4   POC LYMPH PERCENT 27.0 10 - 50 %L   MID (cbc) 0.4 0 - 0.9  POC MID % 5.4 0 - 12 %M   POC Granulocyte 5.1 2 - 6.9   Granulocyte percent 67.6 37 - 80 %G   RBC 4.47 4.04 - 5.48 M/uL   Hemoglobin 14.3 12.2 - 16.2 g/dL   HCT, POC 44.3 37.7 - 47.9 %   MCV 99.2 (A) 80 - 97 fL   MCH, POC 32.1 (A) 27 - 31.2 pg   MCHC 32.3 31.8 - 35.4 g/dL   RDW, POC 14.0 %   Platelet Count, POC 226.0 142 - 424 K/uL   MPV 7.4 0 - 99.8 fL    ASSESSMENT & PLAN  Darlene [name] was seen today for foot swelling and depression.  Diagnoses and all orders for this visit:  Right ankle swelling: Most likely soft tissue, however she lacks a mechanism of injury per her story.  Will apply sweedo and advised Tylenol 650 q6 prn for pain.  Will rule out VTE with D-dimer and will scan left lower leg tonight if positive. -     D-dimer, quantitative (not at  [facility]) -     Uric acid -     DG Foot Complete Left; Future -     DG Ankle Complete Left; Future -     POCT CBC  Tachycardia: Chronic. Likely secondary to the last problem.   Cachexia [facility]): Likely secondary to the last problem.   History of COPD: Managed by her PCP.     The patient was advised to call or come back to clinic if she does not see an improvement in symptoms, or worsens with the above plan.   Philis Fendt, MHS, PA-C [facility] 04/22/2015 6:12 PM

## 2015-04-22 NOTE — Patient Instructions (Signed)
Please wear your ankle brace for one week, or more if needed.  For pain take Tylenol 650 mg every six hours.  I will call you if your d-dimer is elevated and will order an ultrasound of your leg to rule out blood clot.

## 2015-04-22 NOTE — Telephone Encounter (Signed)
Darlene [name] from [facility] called regarding D-Dimer. She has called and left messages but has not received return call. I notified her that you did get those results.

## 2015-04-22 NOTE — Telephone Encounter (Signed)
Left message to return call. Need to speak with her regarding venous doppler details

## 2015-04-23 NOTE — Telephone Encounter (Signed)
Thank you Allie.  Appreciate your repeated efforts in trying to contact patient to fill order as well. Philis Fendt, MS, PA-C   12:22 AM, 04/23/2015

## 2016-07-15 ENCOUNTER — Institutional Professional Consult (permissible substitution): Payer: BC Managed Care – PPO | Admitting: Internal Medicine

## 2016-08-04 ENCOUNTER — Encounter: Payer: Self-pay | Admitting: Internal Medicine

## 2016-08-04 ENCOUNTER — Ambulatory Visit (INDEPENDENT_AMBULATORY_CARE_PROVIDER_SITE_OTHER): Payer: BC Managed Care – PPO | Admitting: Internal Medicine

## 2016-08-04 ENCOUNTER — Ambulatory Visit (INDEPENDENT_AMBULATORY_CARE_PROVIDER_SITE_OTHER)
Admission: RE | Admit: 2016-08-04 | Discharge: 2016-08-04 | Disposition: A | Discharge status: Home / Self Care | Payer: BC Managed Care – PPO | Source: Ambulatory Visit | Attending: Internal Medicine | Admitting: Internal Medicine

## 2016-08-04 VITALS — BP 128/80 | HR 107 | Ht 63.0 in | Wt 93.0 lb

## 2016-08-04 DIAGNOSIS — J449 Chronic obstructive pulmonary disease, unspecified: Secondary | ICD-10-CM

## 2016-08-04 DIAGNOSIS — F17210 Nicotine dependence, cigarettes, uncomplicated: Secondary | ICD-10-CM | POA: Diagnosis not present

## 2016-08-04 DIAGNOSIS — R911 Solitary pulmonary nodule: Secondary | ICD-10-CM

## 2016-08-04 MED ORDER — GLYCOPYRROLATE-FORMOTEROL 9-4.8 MCG/ACT IN AERO: 2.0000 | INHALATION_SPRAY | Freq: Two times a day (BID) | RESPIRATORY_TRACT | 11 refills | Status: DC

## 2016-08-04 MED ORDER — GLYCOPYRROLATE-FORMOTEROL 9-4.8 MCG/ACT IN AERO: 2.0000 | INHALATION_SPRAY | Freq: Two times a day (BID) | RESPIRATORY_TRACT | 0 refills | Status: DC

## 2016-08-04 NOTE — Progress Notes (Signed)
Subjective:    Patient ID: Darlene [name], female    DOB: 1955-09-17, 61 y.o.   MRN: [id number]  HPI    Brief patient profile:  54  yowf smoker dx with remote dx copd  By Clance self referred to pulmonary clinic 12/29/2012 with progressive doe x 06/2012 dx with GOLD II/III COPD 02/02/13      History of Present Illness  12/29/2012 1st pulmonary eval in EPIC era / Darlene [name]  cc indolent onset progressive doe x 6 month getting groceries from car to [employer],  crossing parking lots. In am lots watery nasal congestion and cough > 30 min each am, assoc with chest discomfort midline during cough, prod sev tbsp white mucus,, some better p saba uses 4-5 x day.   rec Continue symbicort 160 Take 2 puffs first thing in am and then another 2 puffs about 12 hours later. Only use your albuterol as a rescue medication  Work on inhaler technique:   02/02/2013 f/u ov/Darlene [name] still smoking  Chief Complaint  Patient presents with  . COPD    Breathing is unchanged. Reports SOB, coughing, chest tightness.   not dosing symbicort q 12 and finding needs proair around 4 pm daily. Cough is min prod white thick mucus daily worse in am rec Continue symbicort 160 Take 2 puffs first thing in am and then another 2 puffs about 12 hours later.  Only use your albuterol as a rescue medication to be used if you can't catch your breath by resting or doing a relaxed purse lip breathing pattern. The less you use it, the better it will work when you need it.      08/04/2016  Re-establish  ov/Darlene [name] re:  [place] [facility] Chief Complaint  Patient presents with  . Pulmonary Consult    cough, SOB with physical activity, was recently on prednisone and ABX about 2 weeks ago, still has a lot of congestion   not much better p pred or  abx Still junky cough/ congestion in am > white thick mucus   Using proair 4 x daily  / maybe twice weekly wakes up wheezing but not bad enough usually to  use inhaler noct  Doe x car to back steps MMRC2 =  can't walk a nl pace on a flat grade s sob but does fine slow and flat eg ht ok but can't do wm      No obvious day to day or daytime variability or assoc purulent sputum or mucus plugs or hemoptysis or cp or chest tightness, subjective wheeze or overt sinus or hb symptoms. No unusual exp hx or h/o childhood pna/ asthma or knowledge of premature birth.   . Also denies any obvious fluctuation of symptoms with weather or environmental changes or other aggravating or alleviating factors except as outlined above   Current Medications, Allergies, Complete Past Medical History, Past Surgical History, Family History, and Social History were reviewed in [employer] record.  ROS  The following are not active complaints unless bolded sore throat, dysphagia, dental problems, itching, sneezing,  nasal congestion or excess/ purulent secretions, ear ache,   fever, chills, sweats, unintended wt loss, classically pleuritic or exertional cp,  orthopnea pnd or leg swelling, presyncope, palpitations, abdominal pain, anorexia, nausea, vomiting, diarrhea  or change in bowel or bladder habits, change in stools or urine, dysuria,hematuria,  rash, arthralgias, visual complaints, headache, numbness, weakness or ataxia or problems with walking or coordination,  change in mood/affect or memory.  Review of Systems  Constitutional: Negative for fever and unexpected weight change.  HENT: Positive for congestion. Negative for dental problem, ear pain, nosebleeds, postnasal drip, rhinorrhea, sinus pressure, sneezing, sore throat and trouble swallowing.   Eyes: Negative for redness and itching.  Respiratory: Positive for cough, chest tightness and shortness of breath. Negative for wheezing.   Cardiovascular: Negative for palpitations and leg swelling.  Gastrointestinal: Negative for nausea and vomiting.  Genitourinary: Negative for dysuria.  Musculoskeletal: Negative for joint  swelling.  Skin: Negative for rash.  Neurological: Negative for headaches.  Hematological: Does not bruise/bleed easily.  Psychiatric/Behavioral: Negative for dysphoric mood.       Objective:   Physical Exam  amb wf nad/ congested sounding cough vital signs reviewed  - Note on arrival 02 sats  97% on RA     Wt Readings from Last 3 Encounters:  08/04/16 93 lb (42.2 kg)  04/22/15 102 lb (46.3 kg)  03/11/15 104 lb (47.2 kg)      02/02/13 91 lb (41.277 kg)  12/29/12 92 lb 9.6 oz (42.003 kg)      HEENT: nl dentition, turbinates bilaterally, and oropharynx. Nl external ear canals without cough reflex   NECK :  without JVD/Nodes/TM/ nl carotid upstrokes bilaterally   LUNGS: no acc muscle use,  slt barrel chest chest with distant bs, minimal insp and exp rhonchi bilaterally    CV:  RRR  no s3 or murmur or increase in P2, and no edema   ABD:  soft and nontender with nl inspiratory excursion in the supine position. No bruits or organomegaly appreciated, bowel sounds nl  MS:  Nl gait/ ext warm without deformities, calf tenderness, cyanosis or clubbing No obvious joint restrictions   SKIN: warm and dry without lesions    NEURO:  alert, approp, nl sensorium with  no motor or cerebellar deficits apparent.    CXR PA and Lateral:   08/04/2016 :    I personally reviewed images and agree with radiology impression as follows:   Copd with several nodular areas bilaterally       Assessment & Plan:

## 2016-08-04 NOTE — Patient Instructions (Addendum)
Plan A = Automatic =  Bevespi Take 2 puffs first thing in am and then another 2 puffs about 12 hours later.    Plan B = Backup Only use your albuterol (proair) as a rescue medication to be used if you can't catch your breath by resting or doing a relaxed purse lip breathing pattern.  - The less you use it, the better it will work when you need it. - Ok to use the inhaler up to 2 puffs  every 4 hours if you must but call for appointment if use goes up over your usual need - Don't leave home without it !!  (think of it like the spare tire for your car)    Please remember to go to the  x-ray department downstairs in the basement  for your tests - we will call you with the results when they are available.  The key is to stop smoking completely before smoking completely stops you!      Please schedule a follow up office visit in 6 weeks, call sooner if needed full pfts

## 2016-08-05 NOTE — Assessment & Plan Note (Signed)
-   PFT's 02/02/2013   FEV1 1.28 p B2 = 33% response with ratio 47 and DLCO 66 corrects to 73  - hfa 75 % 12/29/2012  > 75% 02/02/2013  -   alpha testing 02/02/2013 > MM - 08/04/2016  After extensive coaching HFA effectiveness =    75% > try bevespi   Pt is Group B in terms of symptom/risk and laba/lama therefore appropriate rx at this point.  Will try bevespi but if having flares will need ics added and best option would be symb/spiriva or symb/ seebri depending on insurance   Key to longterm prognosis though is smoking cessation (see separate a/p)   Total time devoted to counseling  > 50 % of initial 60 min office visit:  review case with pt/ discussion of options/alternatives/ personally creating written customized instructions  in presence of pt  then going over those specific  Instructions directly with the pt including how to use all of the meds but in particular covering each new medication in detail and the difference between the maintenance= "automatic" meds and the prns using an action plan format for the latter (If this problem/symptom => do that organization reading Left to right).  Please see AVS from this visit for a full list of these instructions which I personally wrote for this pt and  are unique to this visit.

## 2016-08-05 NOTE — Progress Notes (Signed)
Spoke with pt and notified of results per Dr. Wert. Pt verbalized understanding and denied any questions. 

## 2016-08-05 NOTE — Assessment & Plan Note (Signed)
>   3 min Discussed the risks and costs (both direct and indirect)  of smoking relative to the benefits of quitting but patient unwilling to commit at this point to a specific quit date.    Although I don't endorse regular use of e cigs/ many pts find them helpful; however, I emphasized they should be considered a "one-way bridge" off all tobacco products.  

## 2016-08-05 NOTE — Assessment & Plan Note (Signed)
PER CT CHEST ON 07/10/14... NEW 9 MM SLIGHTLY SPICULATED NODULE IN THE SUPERIOR ASPECT OF THE LEFT LOWER LOBE - PET 08/02/14 > 1. Rounded left lower lobe pulmonary nodule with trace metabolic activity. This combination of findings is not typical for bronchogenic carcinoma or carcinoma in situ and favors benign etiology. As this is a new finding, recommend follow-up CT thorax in 3 months.>  Dr Cyndia Bent rec f/u 3 m but never done   In the interim 2 years it does not appear this has grown and clearly not a candidate for resection anyway so rec continue to follow   Discussed in detail all the  indications, usual  risks and alternatives  relative to the benefits with patient who agrees to proceed with conservative f/u as outlined

## 2016-09-27 ENCOUNTER — Ambulatory Visit: Payer: BC Managed Care – PPO | Admitting: Internal Medicine

## 2016-10-13 ENCOUNTER — Ambulatory Visit (INDEPENDENT_AMBULATORY_CARE_PROVIDER_SITE_OTHER): Payer: BC Managed Care – PPO

## 2016-10-13 ENCOUNTER — Encounter (INDEPENDENT_AMBULATORY_CARE_PROVIDER_SITE_OTHER): Payer: Self-pay | Admitting: Orthopedic Surgery

## 2016-10-13 ENCOUNTER — Ambulatory Visit (INDEPENDENT_AMBULATORY_CARE_PROVIDER_SITE_OTHER): Payer: BC Managed Care – PPO | Admitting: Orthopedic Surgery

## 2016-10-13 DIAGNOSIS — M542 Cervicalgia: Secondary | ICD-10-CM

## 2016-10-13 DIAGNOSIS — M546 Pain in thoracic spine: Secondary | ICD-10-CM | POA: Diagnosis not present

## 2016-10-14 NOTE — Progress Notes (Signed)
Office Visit Note   Patient: Darlene [name]           Date of Birth: [birth year]           MRN: [id number] Visit Date: 10/13/2016 Requested by: Darlene Austin, MD [place], [place] [address] PCP: Darlene Smolder, MD  Subjective: Chief Complaint  Patient presents with  . Middle Back - Pain  . Neck - Pain    HPI: Darlene [name] is a 61 year old patient with upper back pain with no known history of injury.  This is been going on for 6 weeks.  Skin worse with cough.  She recently had bronchitis.  She denies any arm pain but she does report a little bit of numbness and tingling on that left hand.  Pain will occasionally shoot into her neck but is primarily between the shoulder blades.  Takes Tylenol and ibuprofen for pain.  The pain will wake her from sleep at night.  Patient has a pulmonary physician.  She recently was diagnosed with "cyst" in the right lung.  I reviewed her radiographs and this is a new finding in the right upper lobe compared to CT scan from 2 years ago.  It is hard for her to sit and type and do her work.  She can't really lift things.              ROS: All systems reviewed are negative as they relate to the chief complaint within the history of present illness.  Patient denies  fevers or chills. The patient does report generally diminished weight.  Assessment & Plan: Visit Diagnoses:  1. Cervicalgia   2. Pain in thoracic spine     Plan: Impression is to compression fracture atraumatic in the thoracic spine with new right-sided upper lobe lung nodule.  She appears much thinner than when I saw her 4 years ago.  I would favor thoracic spine MRI to evaluate this mid thoracic compression fracture which is giving her night pain and rest pain.  This could be osteoporotic compression fracture or could be something more serious such as a pathologic compression fracture.  I'll see her back after that study  Follow-Up Instructions: No Follow-up on file.     Orders:  Orders Placed This Encounter  Procedures  . XR Thoracic Spine 2 View  . XR Cervical Spine 2 or 3 views  . MR THORACIC SPINE WO CONTRAST   No orders of the defined types were placed in this encounter.     Procedures: No procedures performed   Clinical Data: No additional findings.  Objective: Vital Signs: There were no vitals taken for this visit.  Physical Exam:   Constitutional: Patient appears well-developed HEENT:  Head: Normocephalic Eyes:EOM are normal Neck: Normal range of motion Cardiovascular: Normal rate Pulmonary/chest: Effort normal Neurologic: Patient is alert Skin: Skin is warm Psychiatric: Patient has normal mood and affect    Ortho Exam: Orthopedic exam demonstrates no upper motor neuron signs in the lower extremities.  Palpable pedal pulses.  Negative Babinski negative clonus.  Good ankle dorsi flexion plantar flexion quite after strength.  Patient does have some pain with forward and lateral bending as well as tenderness to palpation in the mid thoracic spine.  No scapulothoracic crepitus is present.  Motor sensory function to the arms is intact neck range of motion is full no other masses lymph adenopathy or skin changes noted in the back region.  Specialty Comments:  No specialty comments available.  Imaging: Xr Thoracic Spine 2 View  Result Date: 10/14/2016 AP lateral thoracic spine reviewed.  Kyphosis is present.  Compression fracture noted in the mid thoracic region.  This is affecting only 1 level.  Bones appear osteopenic.  Xr Cervical Spine 2 Or 3 Views  Result Date: 10/14/2016 AP lateral cervical spine reviewed.  There are some loss of lordosis on the lateral.  Osteophytes are present anteriorly at C5-6.  No spondylolisthesis or compression fractures noted.  Mild facet arthritis present also prominently in the mid to lower cervical spine.    PMFS History: Patient Active Problem List   Diagnosis Date Noted  . Anxiety   . COPD  (chronic obstructive pulmonary disease) ([place])   . Substance abuse   . Lesion of left lung 07/10/2014  . Cough 03/17/2013  . Chest pain 03/17/2013  . Cigarette smoker 12/30/2012  . COPD GOLD III/ still smoking  12/29/2012   Past Medical History:  Diagnosis Date  . Anxiety   . COPD (chronic obstructive pulmonary disease) ([place])   . Emphysema   . Substance abuse    tobacco dependance    Family History  Problem Relation Age of Onset  . Alzheimer's disease Mother   . Cancer Paternal Aunt     colon    Past Surgical History:  Procedure Laterality Date  . BACK SURGERY    . KNEE SURGERY     Social History   Occupational History  . [employer]   Social History Main Topics  . Smoking status: Current Every Day Smoker    Packs/day: 1.00    Years: 42.00    Types: Cigarettes  . Smokeless tobacco: Never Used  . Alcohol use 1.8 oz/week    3 Cans of beer per week  . Drug use: No  . Sexual activity: Not on file

## 2016-10-18 ENCOUNTER — Telehealth (INDEPENDENT_AMBULATORY_CARE_PROVIDER_SITE_OTHER): Payer: Self-pay | Admitting: Radiology

## 2016-10-18 MED ORDER — ACETAMINOPHEN-CODEINE #3 300-30 MG PO TABS: 1.0000 | ORAL_TABLET | Freq: Three times a day (TID) | ORAL | 0 refills | Status: DC | PRN

## 2016-10-18 NOTE — Telephone Encounter (Signed)
Ok for t 3 1  po q 8 # 30 pls clal thx

## 2016-10-18 NOTE — Telephone Encounter (Signed)
Patient is calling and states that she is in a lot of pain and she has called the MRI facility and they said they can't do the MRI till 10/27/2016.  She wants to know wat she can do for the pain.  She is also needing to discuss a note for work.  Please call her back @ [phone].

## 2016-10-18 NOTE — Telephone Encounter (Signed)
Called to pharmacy. Pt aware

## 2016-10-18 NOTE — Telephone Encounter (Signed)
Needs work note covering 05/07-05/09 and she will see how she feels then. She is asking for something for pain. She has been taking ibuprofen and it does not seem to be helping. Please advise. Thanks.

## 2016-10-19 ENCOUNTER — Telehealth (INDEPENDENT_AMBULATORY_CARE_PROVIDER_SITE_OTHER): Payer: Self-pay

## 2016-10-19 NOTE — Telephone Encounter (Signed)
She told me that she previously had taken ultram and hydrocodone.

## 2016-10-19 NOTE — Telephone Encounter (Signed)
Patient called stating that she can not take Codeine.  Would like to know if another Rx can be called into her pharmacy.  CB# is [phone] or [phone]. Thank You.

## 2016-10-19 NOTE — Telephone Encounter (Signed)
What can she take?

## 2016-10-19 NOTE — Telephone Encounter (Signed)
Please advise. Thanks.  

## 2016-10-20 ENCOUNTER — Telehealth (INDEPENDENT_AMBULATORY_CARE_PROVIDER_SITE_OTHER): Payer: Self-pay | Admitting: Orthopedic Surgery

## 2016-10-20 MED ORDER — IBUPROFEN 800 MG PO TABS: 800.0000 mg | ORAL_TABLET | Freq: Two times a day (BID) | ORAL | 0 refills | Status: DC

## 2016-10-20 NOTE — Telephone Encounter (Signed)
Patient called with fax number to fax work note. The fax# is [phone]

## 2016-10-20 NOTE — Telephone Encounter (Signed)
PT CALLED AND ASKED IF WE CAN CALL IN RX FOR IBUPROFEN, SHE CANNOT TAKE TYLENOL.  [phone]

## 2016-10-20 NOTE — Telephone Encounter (Signed)
Note faxed to number provided by patient

## 2016-10-20 NOTE — Telephone Encounter (Signed)
I sent this rx for ibuprofen for patient and extended work note through today.

## 2016-10-20 NOTE — Telephone Encounter (Signed)
Ok for [employer] 1 po q 8

## 2016-10-27 ENCOUNTER — Ambulatory Visit
Admission: RE | Admit: 2016-10-27 | Discharge: 2016-10-27 | Disposition: A | Discharge status: Home / Self Care | Payer: BC Managed Care – PPO | Source: Ambulatory Visit | Attending: Orthopedic Surgery | Admitting: Orthopedic Surgery

## 2016-10-27 DIAGNOSIS — M546 Pain in thoracic spine: Secondary | ICD-10-CM

## 2016-11-01 ENCOUNTER — Telehealth (INDEPENDENT_AMBULATORY_CARE_PROVIDER_SITE_OTHER): Payer: Self-pay | Admitting: Radiology

## 2016-11-01 MED ORDER — CALCITONIN (SALMON) 200 UNIT/ACT NA SOLN: 1.0000 | Freq: Every day | NASAL | 0 refills | Status: DC

## 2016-11-01 NOTE — Telephone Encounter (Signed)
Multiple compression fractures - I called and talked to the patient's for about 10 minutes.  She is to try this over the counter brace.  In addition please call her in some calcitonin nasal spray for 2 weeks.  She'll need an out of work note for 2 weeks.  Please cancel appointment for Wednesday.  She'll need a return office visit for 6 weeks just to recheck on the healing of the fracture.  Thank you

## 2016-11-01 NOTE — Telephone Encounter (Signed)
Please advise on MRI results. Thanks.

## 2016-11-01 NOTE — Telephone Encounter (Signed)
Patient called stating that she had MRI done last week and that she needs to know the results and if he is going to be able to help her. She can barely stand the pain.   Patient does have follow up appt scheduled to review MRI on Wed. 5/23.  Please call patient back as soon as possible to advise. [phone]

## 2016-11-01 NOTE — Telephone Encounter (Signed)
S/w patient advised note done. This was faxed per her request to [phone]. appt cx and she will call to r/s. rx sent to pharmacy and patient is aware

## 2016-11-01 NOTE — Addendum Note (Signed)
Addended byLaurann Montana on: 11/01/2016 04:31 PM   Modules accepted: Orders

## 2016-11-03 ENCOUNTER — Ambulatory Visit (INDEPENDENT_AMBULATORY_CARE_PROVIDER_SITE_OTHER): Payer: BC Managed Care – PPO | Admitting: Orthopedic Surgery

## 2016-11-11 ENCOUNTER — Telehealth (INDEPENDENT_AMBULATORY_CARE_PROVIDER_SITE_OTHER): Payer: Self-pay

## 2016-11-11 ENCOUNTER — Telehealth (INDEPENDENT_AMBULATORY_CARE_PROVIDER_SITE_OTHER): Payer: Self-pay | Admitting: Orthopedic Surgery

## 2016-11-11 NOTE — Telephone Encounter (Signed)
Faxed note to [phone]

## 2016-11-11 NOTE — Telephone Encounter (Signed)
Please advise 

## 2016-11-11 NOTE — Telephone Encounter (Signed)
Patient called and asked for a release to be faxed over to her work so she can return back on Monday June 4th. Fax # [phone]

## 2016-11-11 NOTE — Telephone Encounter (Signed)
Patient would like to know if she can get another work note keeping her out for about a week or so.  She is going to try to apply for short term disability.  [phone].

## 2016-11-11 NOTE — Telephone Encounter (Signed)
y

## 2016-11-11 NOTE — Telephone Encounter (Signed)
Is this okay?

## 2016-11-12 NOTE — Telephone Encounter (Signed)
IC patient and advised letter done [place] through 11/21/16, ok to return to work 11/22/2016.  She requests we fax it attn Vicente Serene [phone], this has been done

## 2016-11-29 ENCOUNTER — Telehealth (INDEPENDENT_AMBULATORY_CARE_PROVIDER_SITE_OTHER): Payer: Self-pay | Admitting: Orthopedic Surgery

## 2016-11-29 NOTE — Telephone Encounter (Signed)
Note was faxed Patient advised done.

## 2016-11-29 NOTE — Telephone Encounter (Signed)
Please advise ok for note?

## 2016-11-29 NOTE — Telephone Encounter (Signed)
Patient called asked if Dr Marlou Sa will write her a  work note stating she was out of work last week and this week. Patient said she is employed with [employer]. The fax# is [phone].  AttnMardene Celeste. Patient advised she is trying to get S/T disability. Patient said the pain she is having is no better sometimes worse.

## 2016-11-29 NOTE — Telephone Encounter (Signed)
Ok for note 

## 2016-12-01 ENCOUNTER — Telehealth (INDEPENDENT_AMBULATORY_CARE_PROVIDER_SITE_OTHER): Payer: Self-pay

## 2016-12-01 NOTE — Telephone Encounter (Signed)
IC s/w patient and she requested note faxed to [phone] claim# [id number] and also to Mardene Celeste at her job [phone]. Note faxed to both places. She also requested note that she could return to work on Monday. This was done also and faxed to [place] per patients request.

## 2016-12-01 NOTE — Telephone Encounter (Signed)
Patient would like for you to give her a call.  CB# is [phone].

## 2016-12-02 ENCOUNTER — Telehealth (INDEPENDENT_AMBULATORY_CARE_PROVIDER_SITE_OTHER): Payer: Self-pay | Admitting: Orthopedic Surgery

## 2016-12-02 NOTE — Telephone Encounter (Signed)
refaxed to both numbers. Patient aware

## 2016-12-02 NOTE — Telephone Encounter (Signed)
Patient called asking for the doctors note to be faxed to her employer. Fax # [phone]

## 2016-12-02 NOTE — Telephone Encounter (Signed)
Also fax to [phone] attn donna

## 2016-12-13 ENCOUNTER — Telehealth (INDEPENDENT_AMBULATORY_CARE_PROVIDER_SITE_OTHER): Payer: Self-pay | Admitting: Orthopedic Surgery

## 2016-12-13 NOTE — Telephone Encounter (Signed)
Please advise. The only thing that we have is a rib belt, not sure that this will help her pain much.

## 2016-12-13 NOTE — Telephone Encounter (Signed)
Pt wants to see if she can get a brace for her cracked ribs, she has not went to ER for this. Also made appt for next week.  [phone]

## 2016-12-13 NOTE — Telephone Encounter (Signed)
Okay for rib belt please call thanks

## 2016-12-14 NOTE — Telephone Encounter (Signed)
Coming to see dr dean on Monday. She wanted to wait until then.

## 2016-12-17 ENCOUNTER — Telehealth: Payer: Self-pay | Admitting: Internal Medicine

## 2016-12-17 NOTE — Telephone Encounter (Signed)
Per MW- the cxr is unchanged from prior. She needs 6 wk rov with PFT. I spoke with the pt and scheduled her for this and nothing further needed.

## 2016-12-17 NOTE — Telephone Encounter (Signed)
Call report taken on the pt.  The xray was done at [facility]---this was called to MW due to the need for the follow up for CT.  Will forward to MW and copy of the report has been given to leslie.

## 2016-12-20 ENCOUNTER — Telehealth (INDEPENDENT_AMBULATORY_CARE_PROVIDER_SITE_OTHER): Payer: Self-pay | Admitting: *Deleted

## 2016-12-20 ENCOUNTER — Ambulatory Visit (INDEPENDENT_AMBULATORY_CARE_PROVIDER_SITE_OTHER): Payer: BC Managed Care – PPO | Admitting: Orthopedic Surgery

## 2016-12-20 NOTE — Telephone Encounter (Signed)
Spoke with patient and she said that they diagnosed her with pleurisy.  She will not keep appt.

## 2016-12-20 NOTE — Telephone Encounter (Signed)
Pt called stating she went to walk in clinic Friday and they stated she did not have broken ribs on the right side. She stated she needed a brace and does not know if she needs to keep this appt.

## 2016-12-24 ENCOUNTER — Other Ambulatory Visit: Payer: Self-pay | Admitting: Family Medicine

## 2016-12-24 DIAGNOSIS — R911 Solitary pulmonary nodule: Secondary | ICD-10-CM

## 2016-12-28 ENCOUNTER — Telehealth (INDEPENDENT_AMBULATORY_CARE_PROVIDER_SITE_OTHER): Payer: Self-pay | Admitting: Orthopedic Surgery

## 2016-12-28 NOTE — Telephone Encounter (Signed)
Records 10/12/2016- present faxed to [facility]

## 2016-12-31 ENCOUNTER — Ambulatory Visit
Admission: RE | Admit: 2016-12-31 | Discharge: 2016-12-31 | Disposition: A | Discharge status: Home / Self Care | Payer: BC Managed Care – PPO | Source: Ambulatory Visit | Attending: Family Medicine | Admitting: Family Medicine

## 2016-12-31 DIAGNOSIS — R911 Solitary pulmonary nodule: Secondary | ICD-10-CM

## 2016-12-31 MED ORDER — IOPAMIDOL (ISOVUE-300) INJECTION 61%
75.0000 mL | Freq: Once | INTRAVENOUS | Status: AC | PRN
  Administered 2016-12-31: 75 mL via INTRAVENOUS

## 2017-01-04 ENCOUNTER — Telehealth (INDEPENDENT_AMBULATORY_CARE_PROVIDER_SITE_OTHER): Payer: Self-pay | Admitting: Orthopedic Surgery

## 2017-01-04 NOTE — Telephone Encounter (Signed)
Can you look into this one?  Not sure which of you is here today, sorry to send to both!!  Thanks!

## 2017-01-04 NOTE — Telephone Encounter (Signed)
Pt says she needs to talk with someone about short term disability needing more info. Please call pt [phone].

## 2017-01-28 ENCOUNTER — Ambulatory Visit (INDEPENDENT_AMBULATORY_CARE_PROVIDER_SITE_OTHER): Payer: BC Managed Care – PPO | Admitting: Internal Medicine

## 2017-01-28 ENCOUNTER — Encounter: Payer: Self-pay | Admitting: Internal Medicine

## 2017-01-28 VITALS — BP 134/72 | HR 105 | Ht 63.0 in | Wt 88.6 lb

## 2017-01-28 DIAGNOSIS — J449 Chronic obstructive pulmonary disease, unspecified: Secondary | ICD-10-CM | POA: Diagnosis not present

## 2017-01-28 DIAGNOSIS — F17210 Nicotine dependence, cigarettes, uncomplicated: Secondary | ICD-10-CM

## 2017-01-28 DIAGNOSIS — R911 Solitary pulmonary nodule: Secondary | ICD-10-CM

## 2017-01-28 MED ORDER — BUDESONIDE-FORMOTEROL FUMARATE 160-4.5 MCG/ACT IN AERO: 2.0000 | INHALATION_SPRAY | Freq: Two times a day (BID) | RESPIRATORY_TRACT | 0 refills | Status: DC

## 2017-01-28 MED ORDER — BUDESONIDE-FORMOTEROL FUMARATE 160-4.5 MCG/ACT IN AERO: 2.0000 | INHALATION_SPRAY | Freq: Two times a day (BID) | RESPIRATORY_TRACT | 6 refills | Status: DC

## 2017-01-28 NOTE — Progress Notes (Signed)
PFT attempted today, it could not be completed due to pt's inability to perform the proper technique of any of the tests. [name] was made aware of this.

## 2017-01-28 NOTE — Assessment & Plan Note (Signed)
-   PFT's 02/02/2013   FEV1 1.28 (50%)   p  33% response to saba  with ratio 47 and DLCO 66 corrects to 73  - hfa 75 % 12/29/2012  > 75% 02/02/2013  -   alpha testing 02/02/2013 > MM - 08/04/2016  After extensive coaching HFA effectiveness =    75% > try bevespi > no decrease saba use - 01/28/2017  After extensive coaching HFA effectiveness =    75% > rechallenge with symb 160 2bid    Active smoking not able to use hfa optimally nor do repeat pfts today and way over using saba so def not in optimal control at this point  Will try symbicort 160 2bid and add spiriva next if not improving and in meatim work on smoking cessation.    I had an extended discussion with the patient reviewing all relevant studies completed to date and  lasting 15 to 20 minutes of a 25 minute visit    Each maintenance medication was reviewed in detail including most importantly the difference between maintenance and prns and under what circumstances the prns are to be triggered using an action plan format that is not reflected in the computer generated alphabetically organized AVS.    Please see AVS for specific instructions unique to this visit that I personally wrote and verbalized to the the pt in detail and then reviewed with pt  by my nurse highlighting any  changes in therapy recommended at today's visit to their plan of care.

## 2017-01-28 NOTE — Progress Notes (Signed)
Subjective:    Patient ID: Darlene [name], female    DOB: February 05, 1956    MRN: [id number]      Brief patient profile:  49  yowf smoker dx with remote dx copd  By Clance self referred to pulmonary clinic 12/29/2012 with progressive doe x 06/2012 dx with GOLD II/III COPD 02/02/13      History of Present Illness  12/29/2012 1st pulmonary eval in EPIC era / Darlene [name]  cc indolent onset progressive doe x 6 month getting groceries from car to [employer],  crossing parking lots. In am lots watery nasal congestion and cough > 30 min each am, assoc with chest discomfort midline during cough, prod sev tbsp white mucus,, some better p saba uses 4-5 x day.   rec Continue symbicort 160 Take 2 puffs first thing in am and then another 2 puffs about 12 hours later. Only use your albuterol as a rescue medication  Work on inhaler technique:   02/02/2013 f/u ov/Darlene [name] still smoking  Chief Complaint  Patient presents with  . COPD    Breathing is unchanged. Reports SOB, coughing, chest tightness.   not dosing symbicort q 12 and finding needs proair around 4 pm daily. Cough is min prod white thick mucus daily worse in am rec Continue symbicort 160 Take 2 puffs first thing in am and then another 2 puffs about 12 hours later.  Only use your albuterol as a rescue medication to be used if you can't catch your breath by resting or doing a relaxed purse lip breathing pattern. The less you use it, the better it will work when you need it.      08/04/2016  Re-establish  ov/Darlene [name] re:  Copd GOLD III/ still smoking  Chief Complaint  Patient presents with  . Pulmonary Consult    cough, SOB with physical activity, was recently on prednisone and ABX about 2 weeks ago, still has a lot of congestion   not much better p pred or  abx Still junky cough/ congestion in am > white thick mucus   Using proair 4 x daily  / maybe twice weekly wakes up wheezing but not bad enough usually to  use inhaler noct  Doe x car to back steps MMRC2  = can't walk a nl pace on a flat grade s sob but does fine slow and flat eg ht ok but can't do wm  rec Plan A = Automatic =  Bevespi Take 2 puffs first thing in am and then another 2 puffs about 12 hours later.  Plan B = Backup Only use your albuterol (proair) as a rescue medication     01/28/2017  f/u ov/Darlene [name] re:  GOLD III/ MPN's  Chief Complaint  Patient presents with  . Follow-up    pt unable to complete PFT.  pt c/o sob with exertion, prod cough with mostly clear mucus.    some better doe since bevespi but still smoking and using saba way too much  Cough worse in am's no better on bevespi ? Better on sykbiocort Doe still = MMRC2   No obvious day to day or daytime variability or assoc  purulent sputum or mucus plugs or hemoptysis or cp or chest tightness, subjective wheeze or overt sinus or hb symptoms. No unusual exp hx or h/o childhood pna/ asthma or knowledge of premature birth.  Sleeping ok without nocturnal  or early am exacerbation  of respiratory  c/o's or need for noct saba. Also denies any obvious fluctuation  of symptoms with weather or environmental changes or other aggravating or alleviating factors except as outlined above   Current Medications, Allergies, Complete Past Medical History, Past Surgical History, Family History, and Social History were reviewed in [employer] record.  ROS  The following are not active complaints unless bolded sore throat, dysphagia, dental problems, itching, sneezing,  nasal congestion or excess/ purulent secretions, ear ache,   fever, chills, sweats, unintended wt loss, classically pleuritic or exertional cp,  orthopnea pnd or leg swelling, presyncope, palpitations, abdominal pain, anorexia, nausea, vomiting, diarrhea  or change in bowel or bladder habits, change in stools or urine, dysuria,hematuria,  rash, arthralgias, visual complaints, headache, numbness, weakness or ataxia or problems with walking or coordination,   change in mood/affect or memory.                   Objective:   Physical Exam  amb wf nad/ congested sounding cough vital signs reviewed  - Note on arrival 02 sats  96% on RA     01/28/2017       88  08/04/16 93 lb (42.2 kg)  04/22/15 102 lb (46.3 kg)  03/11/15 104 lb (47.2 kg)      02/02/13 91 lb (41.277 kg)  12/29/12 92 lb 9.6 oz (42.003 kg)      HEENT: nl dentition, turbinates bilaterally, and oropharynx. Nl external ear canals without cough reflex   NECK :  without JVD/Nodes/TM/ nl carotid upstrokes bilaterally   LUNGS: no acc muscle use,  slt  barrel chest chest with distant bs, minimal insp and exp rhonchi bilaterally    CV:  RRR  no s3 or murmur or increase in P2, and no edema   ABD:  soft and nontender with nl inspiratory excursion in the supine position. No bruits or organomegaly appreciated, bowel sounds nl  MS:  Nl gait/ ext warm without deformities, calf tenderness, cyanosis or clubbing No obvious joint restrictions   SKIN: warm and dry without lesions    NEURO:  alert, approp, nl sensorium with  no motor or cerebellar deficits apparent.       I personally reviewed images and agree with radiology impression as follows:   Chest CT with contrast 12/31/16 1. There are thoracic spine compression fractures, new since prior CT, which may be chronic or recent. The chronicity of these fractures would be best assessed with thoracic spine MRI. 2. Lung nodules as described. The largest, which is cavitary, lies in the right upper lobe, measuring 20 x 9 mm. There is a new subcentimeter nodule in the inferior right upper lobe. There has been mild enlargement of a circumscribed oval left lower lobe Nodule.  3. No evidence of pneumonia or pulmonary edema. 4. Moderate to advanced emphysema.      Assessment & Plan:

## 2017-01-28 NOTE — Patient Instructions (Signed)
Plan A = Automatic = change from bevespi to  symbicort 160 Take 2 puffs first thing in am and then another 2 puffs about 12 hours later.   Work on inhaler technique:  relax and gently blow all the way out then take a nice smooth deep breath back in, triggering the inhaler at same time you start breathing in.  Hold for up to 5 seconds if you can. Blow out thru nose. Rinse and gargle with water when done      Plan B = Backup Only use your albuterol as a rescue medication to be used if you can't catch your breath by resting or doing a relaxed purse lip breathing pattern.  - The less you use it, the better it will work when you need it. - Ok to use the inhaler up to 2 puffs  every 4 hours if you must but call for appointment if use goes up over your usual need - Don't leave home without it !!  (think of it like the spare tire for your car)    Please schedule a follow up visit in 3 months but call sooner if needed

## 2017-01-28 NOTE — Assessment & Plan Note (Signed)
-  PET 08/02/14 > 1. Rounded left lower lobe pulmonary nodule with trace metabolic activity. This combination of findings is not typical for bronchogenic carcinoma or carcinoma in situ and favors benign etiology. As this is a new finding, recommend follow-up CT thorax in 3 months.>  Dr Cyndia Bent rec f/u 3 m but never done  - CT chest 7/209/18 Lung nodules as described. The largest, which is cavitary, lies in the right upper lobe, measuring 20 x 9 mm. There is a new subcentimeter nodule in the inferior right upper lobe. There has been mild enlargement of a circumscribed oval left lower lobe Nodule > pt declined bx   Apparently brother died p attempted lung [facility]x [facility] so she is quite convinced she would not want a biopsy and would refuse treatments if proves to have lung ca unless there is evidence that it might improve her symptoms   Could consider PET to see if any evidence of met dz outside the lung which might be amenable to CT bx though not clear she would allow that either   Discussed in detail all the  indications, usual  risks and alternatives  relative to the benefits with patient who agrees to proceed with conservative f/u only  With just a cxr on return in 3 months unless new symptoms develop

## 2017-01-28 NOTE — Assessment & Plan Note (Signed)
>   3 months  Live or breath discussion/ not ready to commit to quit at this point

## 2017-05-04 ENCOUNTER — Ambulatory Visit: Payer: BC Managed Care – PPO | Admitting: Internal Medicine

## 2017-05-11 ENCOUNTER — Ambulatory Visit
Admission: RE | Admit: 2017-05-11 | Discharge: 2017-05-11 | Disposition: A | Discharge status: Home / Self Care | Payer: BC Managed Care – PPO | Source: Ambulatory Visit | Attending: Family Medicine | Admitting: Family Medicine

## 2017-05-11 ENCOUNTER — Other Ambulatory Visit: Payer: Self-pay | Admitting: Family Medicine

## 2017-05-11 DIAGNOSIS — M549 Dorsalgia, unspecified: Secondary | ICD-10-CM

## 2017-05-23 ENCOUNTER — Ambulatory Visit: Payer: BC Managed Care – PPO | Admitting: Internal Medicine

## 2017-06-17 ENCOUNTER — Emergency Department (HOSPITAL_COMMUNITY): Payer: BC Managed Care – PPO

## 2017-06-17 ENCOUNTER — Other Ambulatory Visit: Payer: Self-pay

## 2017-06-17 ENCOUNTER — Encounter (HOSPITAL_COMMUNITY): Payer: Self-pay | Admitting: *Deleted

## 2017-06-17 ENCOUNTER — Inpatient Hospital Stay (HOSPITAL_COMMUNITY)
Admission: EM | Admit: 2017-06-17 | Discharge: 2017-06-21 | DRG: 190 | Disposition: A | Discharge status: Home / Self Care | Payer: BC Managed Care – PPO | Attending: Internal Medicine | Admitting: Internal Medicine

## 2017-06-17 DIAGNOSIS — R0602 Shortness of breath: Secondary | ICD-10-CM

## 2017-06-17 DIAGNOSIS — F419 Anxiety disorder, unspecified: Secondary | ICD-10-CM | POA: Diagnosis present

## 2017-06-17 DIAGNOSIS — E876 Hypokalemia: Secondary | ICD-10-CM | POA: Diagnosis present

## 2017-06-17 DIAGNOSIS — J9601 Acute respiratory failure with hypoxia: Secondary | ICD-10-CM | POA: Diagnosis present

## 2017-06-17 DIAGNOSIS — D72829 Elevated white blood cell count, unspecified: Secondary | ICD-10-CM | POA: Diagnosis present

## 2017-06-17 DIAGNOSIS — F329 Major depressive disorder, single episode, unspecified: Secondary | ICD-10-CM | POA: Diagnosis present

## 2017-06-17 DIAGNOSIS — Z23 Encounter for immunization: Secondary | ICD-10-CM | POA: Diagnosis not present

## 2017-06-17 DIAGNOSIS — J439 Emphysema, unspecified: Secondary | ICD-10-CM

## 2017-06-17 DIAGNOSIS — E43 Unspecified severe protein-calorie malnutrition: Secondary | ICD-10-CM | POA: Diagnosis present

## 2017-06-17 DIAGNOSIS — J449 Chronic obstructive pulmonary disease, unspecified: Principal | ICD-10-CM | POA: Diagnosis present

## 2017-06-17 DIAGNOSIS — J9312 Secondary spontaneous pneumothorax: Secondary | ICD-10-CM | POA: Diagnosis present

## 2017-06-17 DIAGNOSIS — Z720 Tobacco use: Secondary | ICD-10-CM | POA: Diagnosis present

## 2017-06-17 DIAGNOSIS — Z9689 Presence of other specified functional implants: Secondary | ICD-10-CM | POA: Diagnosis not present

## 2017-06-17 DIAGNOSIS — Z4682 Encounter for fitting and adjustment of non-vascular catheter: Secondary | ICD-10-CM

## 2017-06-17 DIAGNOSIS — J9383 Other pneumothorax: Secondary | ICD-10-CM

## 2017-06-17 DIAGNOSIS — F17210 Nicotine dependence, cigarettes, uncomplicated: Secondary | ICD-10-CM | POA: Diagnosis present

## 2017-06-17 DIAGNOSIS — J9311 Primary spontaneous pneumothorax: Secondary | ICD-10-CM | POA: Diagnosis not present

## 2017-06-17 DIAGNOSIS — J939 Pneumothorax, unspecified: Secondary | ICD-10-CM | POA: Diagnosis not present

## 2017-06-17 DIAGNOSIS — R911 Solitary pulmonary nodule: Secondary | ICD-10-CM | POA: Diagnosis present

## 2017-06-17 DIAGNOSIS — R64 Cachexia: Secondary | ICD-10-CM

## 2017-06-17 DIAGNOSIS — M810 Age-related osteoporosis without current pathological fracture: Secondary | ICD-10-CM | POA: Diagnosis present

## 2017-06-17 DIAGNOSIS — Z681 Body mass index (BMI) 19 or less, adult: Secondary | ICD-10-CM

## 2017-06-17 LAB — CBC
HEMATOCRIT: 44.2 % (ref 36.0–46.0)
HEMOGLOBIN: 14.3 g/dL (ref 12.0–15.0)
MCH: 34.6 pg — AB (ref 26.0–34.0)
MCHC: 32.4 g/dL (ref 30.0–36.0)
MCV: 107 fL — ABNORMAL HIGH (ref 78.0–100.0)
Platelets: 328 10*3/uL (ref 150–400)
RBC: 4.13 MIL/uL (ref 3.87–5.11)
RDW: 12.2 % (ref 11.5–15.5)
WBC: 14.6 10*3/uL — ABNORMAL HIGH (ref 4.0–10.5)

## 2017-06-17 LAB — BASIC METABOLIC PANEL
ANION GAP: 12 (ref 5–15)
BUN: 9 mg/dL (ref 6–20)
CO2: 28 mmol/L (ref 22–32)
Calcium: 9.4 mg/dL (ref 8.9–10.3)
Chloride: 96 mmol/L — ABNORMAL LOW (ref 101–111)
Creatinine, Ser: 0.52 mg/dL (ref 0.44–1.00)
GFR calc Af Amer: >60 mL/min (ref >60)
GLUCOSE: 166 mg/dL — AB (ref 65–99)
POTASSIUM: 4.2 mmol/L (ref 3.5–5.1)
Sodium: 136 mmol/L (ref 135–145)

## 2017-06-17 LAB — I-STAT TROPONIN, ED: Troponin i, poc: 0 ng/mL (ref 0.00–0.08)

## 2017-06-17 MED ORDER — ACETAMINOPHEN 325 MG PO TABS: 650.0000 mg | ORAL_TABLET | Freq: Four times a day (QID) | ORAL | Status: DC | PRN

## 2017-06-17 MED ORDER — IPRATROPIUM-ALBUTEROL 0.5-2.5 (3) MG/3ML IN SOLN
3.0000 mL | Freq: Three times a day (TID) | RESPIRATORY_TRACT | Status: DC
  Administered 2017-06-18 – 2017-06-19 (×3): 3 mL via RESPIRATORY_TRACT
  Filled 2017-06-17 (×4): qty 3

## 2017-06-17 MED ORDER — NICOTINE 21 MG/24HR TD PT24
21.0000 mg | MEDICATED_PATCH | Freq: Every day | TRANSDERMAL | Status: DC
  Administered 2017-06-17 – 2017-06-21 (×5): 21 mg via TRANSDERMAL
  Filled 2017-06-17 (×5): qty 1

## 2017-06-17 MED ORDER — DULOXETINE HCL 30 MG PO CPEP
30.0000 mg | ORAL_CAPSULE | Freq: Every day | ORAL | Status: DC
  Administered 2017-06-18 – 2017-06-21 (×4): 30 mg via ORAL
  Filled 2017-06-17 (×4): qty 1

## 2017-06-17 MED ORDER — VITAMIN D (ERGOCALCIFEROL) 1.25 MG (50000 UNIT) PO CAPS: 50000.0000 [IU] | ORAL_CAPSULE | ORAL | Status: DC

## 2017-06-17 MED ORDER — ALBUTEROL SULFATE (2.5 MG/3ML) 0.083% IN NEBU: 2.5000 mg | INHALATION_SOLUTION | Freq: Four times a day (QID) | RESPIRATORY_TRACT | Status: DC | PRN

## 2017-06-17 MED ORDER — LIDOCAINE-EPINEPHRINE 2 %-1:200000 IJ SOLN
10.0000 mL | Freq: Once | INTRAMUSCULAR | Status: AC
Start: 2017-06-17 — End: 2017-06-17
  Administered 2017-06-17: 10 mL via INTRADERMAL
  Filled 2017-06-17: qty 20

## 2017-06-17 MED ORDER — IPRATROPIUM-ALBUTEROL 0.5-2.5 (3) MG/3ML IN SOLN
3.0000 mL | Freq: Four times a day (QID) | RESPIRATORY_TRACT | Status: DC
  Administered 2017-06-17 (×2): 3 mL via RESPIRATORY_TRACT
  Filled 2017-06-17 (×3): qty 3

## 2017-06-17 MED ORDER — ENOXAPARIN SODIUM 40 MG/0.4ML ~~LOC~~ SOLN: 40.0000 mg | SUBCUTANEOUS | Status: DC

## 2017-06-17 MED ORDER — ONDANSETRON HCL 4 MG/2ML IJ SOLN
4.0000 mg | Freq: Four times a day (QID) | INTRAMUSCULAR | Status: DC | PRN
  Administered 2017-06-17: 4 mg via INTRAVENOUS
  Filled 2017-06-17: qty 2

## 2017-06-17 MED ORDER — FENTANYL CITRATE (PF) 100 MCG/2ML IJ SOLN
12.5000 ug | INTRAMUSCULAR | Status: DC | PRN
  Administered 2017-06-17 – 2017-06-20 (×4): 12.5 ug via INTRAVENOUS
  Filled 2017-06-17 (×4): qty 2

## 2017-06-17 MED ORDER — MOMETASONE FURO-FORMOTEROL FUM 200-5 MCG/ACT IN AERO
2.0000 | INHALATION_SPRAY | Freq: Two times a day (BID) | RESPIRATORY_TRACT | Status: DC
  Administered 2017-06-18 – 2017-06-21 (×6): 2 via RESPIRATORY_TRACT
  Filled 2017-06-17: qty 8.8

## 2017-06-17 MED ORDER — LEVOFLOXACIN IN D5W 750 MG/150ML IV SOLN
750.0000 mg | INTRAVENOUS | Status: DC
  Administered 2017-06-17 – 2017-06-19 (×3): 750 mg via INTRAVENOUS
  Filled 2017-06-17 (×3): qty 150

## 2017-06-17 MED ORDER — OXYCODONE HCL 5 MG PO TABS
5.0000 mg | ORAL_TABLET | ORAL | Status: DC | PRN
  Administered 2017-06-17 – 2017-06-18 (×6): 10 mg via ORAL
  Administered 2017-06-19 – 2017-06-20 (×2): 5 mg via ORAL
  Administered 2017-06-20: 10 mg via ORAL
  Filled 2017-06-17: qty 2
  Filled 2017-06-17: qty 1
  Filled 2017-06-17: qty 2
  Filled 2017-06-17: qty 1
  Filled 2017-06-17 (×5): qty 2

## 2017-06-17 MED ORDER — ENSURE ENLIVE PO LIQD
237.0000 mL | Freq: Two times a day (BID) | ORAL | Status: DC
  Administered 2017-06-19 – 2017-06-20 (×3): 237 mL via ORAL
  Filled 2017-06-17: qty 237

## 2017-06-17 MED ORDER — MORPHINE SULFATE (PF) 4 MG/ML IV SOLN
INTRAVENOUS | Status: AC
  Administered 2017-06-17: 4 mg
  Filled 2017-06-17: qty 1

## 2017-06-17 MED ORDER — ONDANSETRON HCL 4 MG PO TABS
4.0000 mg | ORAL_TABLET | Freq: Four times a day (QID) | ORAL | Status: DC | PRN
  Administered 2017-06-18: 4 mg via ORAL
  Filled 2017-06-17: qty 1

## 2017-06-17 MED ORDER — ALPRAZOLAM 0.25 MG PO TABS
0.2500 mg | ORAL_TABLET | Freq: Three times a day (TID) | ORAL | Status: DC | PRN
Start: 2017-06-17 — End: 2017-06-21
  Administered 2017-06-18 – 2017-06-21 (×5): 0.25 mg via ORAL
  Filled 2017-06-17 (×5): qty 1

## 2017-06-17 MED ORDER — ENOXAPARIN SODIUM 30 MG/0.3ML ~~LOC~~ SOLN
30.0000 mg | SUBCUTANEOUS | Status: DC
  Administered 2017-06-18 – 2017-06-20 (×3): 30 mg via SUBCUTANEOUS
  Filled 2017-06-17 (×4): qty 0.3

## 2017-06-17 MED ORDER — SODIUM CHLORIDE 0.9% FLUSH
3.0000 mL | Freq: Two times a day (BID) | INTRAVENOUS | Status: DC
  Administered 2017-06-17 – 2017-06-21 (×5): 3 mL via INTRAVENOUS

## 2017-06-17 MED ORDER — BOOST / RESOURCE BREEZE PO LIQD CUSTOM
1.0000 | Freq: Two times a day (BID) | ORAL | Status: DC
Start: 2017-06-17 — End: 2017-06-21
  Administered 2017-06-17: 1 via ORAL
  Filled 2017-06-17: qty 1

## 2017-06-17 MED ORDER — IPRATROPIUM-ALBUTEROL 0.5-2.5 (3) MG/3ML IN SOLN
3.0000 mL | Freq: Once | RESPIRATORY_TRACT | Status: AC
  Administered 2017-06-17: 3 mL via RESPIRATORY_TRACT
  Filled 2017-06-17: qty 3

## 2017-06-17 MED ORDER — ACETAMINOPHEN 650 MG RE SUPP: 650.0000 mg | Freq: Four times a day (QID) | RECTAL | Status: DC | PRN

## 2017-06-17 NOTE — ED Triage Notes (Signed)
Pt reprots hx of copd and cough. Having increase in productive cough, sob and left side chest pain that radiates around her side. Symptoms started this am. ekg done. spo2 on room air was low at triage, placed on 3L [place] and spo2 now 95%.

## 2017-06-17 NOTE — ED Notes (Signed)
Pt supervisor would like if staff could inform pt to call her supervisor when its time for her to go so she has a ride back home.

## 2017-06-17 NOTE — ED Notes (Signed)
Pt ambulated from triage to room. Asked pt if they would want to be taken to room in w/c, pt denied.

## 2017-06-17 NOTE — Consult Note (Addendum)
[address]       [place] [address]             [phone]        Marco K Ericksen Bendena Medical Record #[id number] Date of Birth: 06-Jun-1956  Referring: No ref. provider found Primary Care: [name], MD Primary Cardiologist:No primary care provider on file.  Chief Complaint:    Chief Complaint  Patient presents with  . Chest Pain  . Shortness of Breath    History of Present Illness:       This is a 62 year old female patient with past medical history of COPD with emphysema, current every day smoker about 1.5 packs/day, osteoporosis, anxiety, and malnutrition who presented to [facility] with a spontaneous pneumothorax.  She was treated with a left pigtail catheter and her most recent x-ray shows almost complete resolution of her previous pneumothorax.  She denies any fevers or chills. We are consulted for management of chest tube.   Current Activity/ Functional Status:  Patient was independent with mobility/ambulation, transfers, ADL's, IADL's.   Zubrod Score: At the time of surgery this patient's most appropriate activity status/level should be described as: []     0    Normal activity, no symptoms [x]     1    Restricted in physical strenuous activity but ambulatory, able to do out light work []     2    Ambulatory and capable of self care, unable to do work activities, up and about                 more than 50%  Of the time                            []     3    Only limited self care, in bed greater than 50% of waking hours []     4    Completely disabled, no self care, confined to bed or chair []     5    Moribund  Past Medical History:  Diagnosis Date  . Anxiety   . COPD (chronic obstructive pulmonary disease) ([place])   . Emphysema   . Substance abuse ([place])    tobacco dependance    Past Surgical History:  Procedure Laterality Date  . BACK SURGERY    . KNEE SURGERY      Social History   Tobacco Use  Smoking Status  Current Every Day Smoker  . Packs/day: 1.50  . Years: 42.00  . Pack years: 63.00  . Types: Cigarettes  Smokeless Tobacco Never Used  Tobacco Comment   will attend cessation class at [facility]    Social History   Substance and Sexual Activity  Alcohol Use Yes  . Alcohol/week: 3.0 oz  . Types: 5 Cans of beer per week   Comment: 3-5 cans three to four timesa week    Social History   Socioeconomic History  . Marital status: Widowed    Spouse name: Not on file  . Number of children: 2  . Years of education: Not on file  . Highest education level: Not on file  Social Needs  . Financial resource strain: Not on file  . Food insecurity - worry: Not on file  . Food insecurity - inability: Not on file  . Transportation needs - medical: Not on file  . Transportation needs - non-medical: Not on file  Occupational History  .  Occupation: Audiological scientist: [employer]  Tobacco Use  . Smoking status: Current Every Day Smoker    Packs/day: 1.50    Years: 42.00    Pack years: 63.00    Types: Cigarettes  . Smokeless tobacco: Never Used  . Tobacco comment: will attend cessation class at [facility]  Substance and Sexual Activity  . Alcohol use: Yes    Alcohol/week: 3.0 oz    Types: 5 Cans of beer per week    Comment: 3-5 cans three to four timesa week  . Drug use: No  . Sexual activity: Not on file  Other Topics Concern  . Not on file  Social History Narrative  . Not on file    Allergies  Allergen Reactions  . Codeine Nausea And Vomiting    Current Facility-Administered Medications  Medication Dose Route Frequency Provider Last Rate Last Dose  . acetaminophen (TYLENOL) tablet 650 mg  650 mg Oral Q6H PRN Samella Parr, NP       Or  . acetaminophen (TYLENOL) suppository 650 mg  650 mg Rectal Q6H PRN Samella Parr, NP      . ALPRAZolam Duanne Moron) tablet 0.25 mg  0.25 mg Oral Q8H PRN Samella Parr, NP   0.25 mg at 06/18/17 0840  . DULoxetine (CYMBALTA) DR capsule  30 mg  30 mg Oral Daily Samella Parr, NP   30 mg at 06/18/17 0824  . enoxaparin (LOVENOX) injection 30 mg  30 mg Subcutaneous Q24H Amin, Ankit Chirag, MD      . feeding supplement (ENSURE ENLIVE) (ENSURE ENLIVE) liquid 237 mL  237 mL Oral BID BM Samella Parr, NP       Or  . feeding supplement (BOOST / RESOURCE BREEZE) liquid 1 Container  1 Container Oral BID BM Samella Parr, NP   1 Container at 06/17/17 2227  . fentaNYL (SUBLIMAZE) injection 12.5 mcg  12.5 mcg Intravenous Q2H PRN Samella Parr, NP   12.5 mcg at 06/18/17 0214  . guaiFENesin (MUCINEX) 12 hr tablet 1,200 mg  1,200 mg Oral BID Raiford Noble Latif, DO   1,200 mg at 06/18/17 1040  . ipratropium-albuterol (DUONEB) 0.5-2.5 (3) MG/3ML nebulizer solution 3 mL  3 mL Nebulization TID Amin, Ankit Chirag, MD      . levofloxacin (LEVAQUIN) IVPB 750 mg  750 mg Intravenous Q24H Samella Parr, NP   Stopped at 06/17/17 1516  . mometasone-formoterol (DULERA) 200-5 MCG/ACT inhaler 2 puff  2 puff Inhalation BID Samella Parr, NP   2 puff at 06/18/17 0858  . nicotine (NICODERM CQ - dosed in mg/24 hours) patch 21 mg  21 mg Transdermal Daily Samella Parr, NP   21 mg at 06/18/17 0826  . ondansetron (ZOFRAN) tablet 4 mg  4 mg Oral Q6H PRN Samella Parr, NP   4 mg at 06/18/17 0839   Or  . ondansetron (ZOFRAN) injection 4 mg  4 mg Intravenous Q6H PRN Samella Parr, NP   4 mg at 06/17/17 1533  . oxyCODONE (Oxy IR/ROXICODONE) immediate release tablet 5-10 mg  5-10 mg Oral Q4H PRN Samella Parr, NP   10 mg at 06/18/17 0824  . [START ON 06/19/2017] pneumococcal 23 valent vaccine (PNU-IMMUNE) injection 0.5 mL  0.5 mL Intramuscular Tomorrow-1000 Amin, Ankit Chirag, MD      . potassium chloride SA (K-DUR,KLOR-CON) CR tablet 40 mEq  40 mEq Oral BID Sheikh, Omair Latif, DO   40 mEq  at 06/18/17 1039  . sodium chloride 0.9 % bolus 1,000 mL  1,000 mL Intravenous Once Raiford Noble Latif, DO 250 mL/hr at 06/18/17 1041 1,000 mL at 06/18/17  1041  . sodium chloride flush (NS) 0.9 % injection 3 mL  3 mL Intravenous Q12H Samella Parr, NP   3 mL at 06/18/17 0841  . Vitamin D (Ergocalciferol) (DRISDOL) capsule 50,000 Units  50,000 Units Oral Q7 days Samella Parr, NP        Medications Prior to Admission  Medication Sig Dispense Refill Last Dose  . albuterol (PROAIR HFA) 108 (90 BASE) MCG/ACT inhaler Inhale 2 puffs into the lungs every 6 (six) hours as needed for wheezing or shortness of breath. 3 each 0 06/17/2017 at Unknown time  . ALPRAZolam (XANAX) 0.25 MG tablet Take 0.25 mg by mouth daily as needed for anxiety.    06/17/2017 at Unknown time  . budesonide-formoterol (SYMBICORT) 160-4.5 MCG/ACT inhaler Inhale 2 puffs into the lungs 2 (two) times daily. 1 Inhaler 6 06/17/2017 at Unknown time  . Cholecalciferol (VITAMIN D) 2000 units tablet Take 6,000 Units by mouth daily.   06/16/2017 at Unknown time  . DULoxetine (CYMBALTA) 60 MG capsule Take 60 mg by mouth daily.   06/17/2017 at Unknown time  . traMADol (ULTRAM) 50 MG tablet Take 50 mg by mouth every 6 (six) hours as needed. for pain  0 06/17/2017 at Unknown time  . Vitamin D, Ergocalciferol, (DRISDOL) 50000 units CAPS capsule Take 50,000 Units by mouth every 7 (seven) days.   Past Month at Unknown time    Family History  Problem Relation Age of Onset  . Alzheimer's disease Mother   . Cancer Paternal Aunt        colon     Review of Systems:   Review of Systems  Constitutional: Positive for malaise/fatigue and weight loss. Negative for chills and fever.  Respiratory: Positive for cough, sputum production and shortness of breath.   Cardiovascular: Negative.   Gastrointestinal: Negative.   Genitourinary: Negative.   Psychiatric/Behavioral: The patient is nervous/anxious.    Pertinent items are noted in HPI.     Physical Exam: BP 135/67 (BP Location: Left Arm)   Pulse (!) 116   Temp 98.8 F (37.1 C) (Oral)   Resp (!) 24   Ht 5\' 3"  (1.6 m)   Wt 85 lb 8.6 oz (38.8 kg)    SpO2 94%   BMI 15.15 kg/m    General appearance: alert, cooperative and no distress Resp: bilateral rhonchi in all fields Cardio: sinus tachycardia GI: soft, non-tender; bowel sounds normal; no masses,  no organomegaly Extremities: extremities normal, atraumatic, no cyanosis or edema      Recent Radiology Findings:   Dg Chest Port 1 View  Result Date: 06/18/2017 CLINICAL DATA:  Chest tube in place. EXAM: PORTABLE CHEST 1 VIEW COMPARISON:  06/17/2017 FINDINGS: The lungs are hyperinflated likely secondary to COPD. There is a left-sided chest tube with the tip at the apex. There is tiny pneumothorax along the left costophrenic angle. There is no focal consolidation. There is no pleural effusion or pneumothorax. The heart and mediastinal contours are unremarkable. The osseous structures are unremarkable. IMPRESSION: 1. Left-sided chest tube with the tip at the apex with a tiny pneumothorax along the left costophrenic angle which has decreased in size compared with the prior exam. Electronically Signed   By: Kathreen Devoid   On: 06/18/2017 09:04   Dg Chest Portable 1 View  Result Date: 06/17/2017  CLINICAL DATA:  Chest tube in place EXAM: PORTABLE CHEST 1 VIEW COMPARISON:  06/17/2017 FINDINGS: Interval placement of left chest tube. The pigtail is partially formed at the left apex. Near complete re-expansion of the left lung with only a very small left lateral pneumothorax at the left base. Left base atelectasis. Heart is normal size. IMPRESSION: Interval placement of left chest tube with pigtail partially formed in the left upper chest. Near complete re-expansion of the left lung with only a small residual left lateral basilar pneumothorax. Electronically Signed   By: Rolm Baptise M.D.   On: 06/17/2017 11:33   Dg Chest Portable 1 View  Result Date: 06/17/2017 CLINICAL DATA:  Shortness of breath and low oxygen saturation, with decreased breath sounds on the left. EXAM: PORTABLE CHEST 1 VIEW COMPARISON:   Chest x-ray dated December 17, 2016. FINDINGS: The cardiomediastinal silhouette is normal in size. Normal pulmonary vascularity. New moderate left pneumothorax with slight mediastinal shift to the right. Emphysematous changes in both lungs again noted. 10 mm nodule in the superior segment of the left lower lobe is grossly unchanged. No acute osseous abnormality. IMPRESSION: 1. New moderate left pneumothorax with slight mediastinal shift to the right. 2. COPD.  10 mm nodule in the left lower lobe is grossly unchanged. Critical Value/emergent results were called by telephone at the time of interpretation on 06/17/2017 at 10:23 am to Dr. Duffy Bruce , who verbally acknowledged these results. Electronically Signed   By: Titus Dubin M.D.   On: 06/17/2017 10:23     I have independently reviewed the above radiologic studies.  Recent Lab Findings: Lab Results  Component Value Date   WBC 16.2 (H) 06/18/2017   HGB 12.1 06/18/2017   HCT 38.1 06/18/2017   PLT 277 06/18/2017   GLUCOSE 90 06/18/2017   ALT 11 (L) 06/18/2017   AST 16 06/18/2017   NA 137 06/18/2017   K 3.4 (L) 06/18/2017   CL 99 (L) 06/18/2017   CREATININE 0.39 (L) 06/18/2017   BUN <5 (L) 06/18/2017   CO2 31 06/18/2017      Assessment / Plan:   Pigtail catheter is in good position.  There is no air leak.  Chest x-ray reviewed with the patient and daughter at the bedside.  Will order another chest x-ray for the morning.   Nicholes Rough, PA-C  06/18/2017 10:48 AM  Will leave further follow up to  pulmonary service, Please call back if need further assistance. Chest tube not placed by thoracic  service  No air leak from chest tube today   I have seen and examined [place] [phone] Office [phone] 06/18/2017 10:48 AM

## 2017-06-17 NOTE — H&P (Signed)
History and Physical    TORRA PALA UXN:[id number] DOB: 1956/01/21 DOA: 06/17/2017   PCP: [name], MD   Attending physician: Reesa Chew  Patient coming from/Resides with: Private residence  Chief Complaint: Cough and chest pain  HPI: Darlene [name] is a 62 y.o. female with medical history significant for severe COPD not on home O2, continued tobacco abuse, osteoporosis, and anxiety disorder.  Presented to the ER today with reports of productive cough with new left-sided chest pain radiating to the lateral chest towards the back.  This is also associated with shortness of breath.  Symptoms were of sudden onset today.  Room air pulse oximetry was documented as "low" in triage and she was subsequently placed on 3 L nasal cannula oxygen with pulse oximetry increasing to 95%.  X-ray performed in the ER revealed new moderate left pneumothorax with slight mediastinal shift to the right.  White count 14,600 differential not obtained.  Subsequently a chest tube has been placed by EDP with near complete reexpansion of the left lung with only a very small left lateral pneumothorax at the left base.  CVTS/Gerhardt contacted by EDP who requested hospitalist admit the patient.  ED Course:  Vital Signs: BP (!) 141/84   Pulse (!) 127   Temp 98.9 F (37.2 C) (Oral)   Resp 20   SpO2 94%  Chest x-ray: As above Lab data: Sodium 136, potassium 4.2, chloride 96, CO2 28, glucose 166, BUN 9, creatinine 0.52, anion gap 12, poc troponin 0 0.00, white count 14,600 differential not obtained, hemoglobin 14.3, MCV 107, platelets 328,000 Medications and treatments: Xylocaine intradermal for chest tube placement, morphine 4 mg IV x1, DuoNeb x1  Review of Systems:  In addition to the HPI above,  No Fever-chills, myalgias or other constitutional symptoms No Headache, changes with Vision or hearing, new weakness, tingling, numbness in any extremity, dizziness, dysarthria or word finding difficulty, gait  disturbance or imbalance, tremors or seizure activity No problems swallowing food or Liquids, indigestion/reflux, choking or coughing while eating, abdominal pain with or after eating No Chest pain, palpitations, orthopnea  No Abdominal pain, N/V, melena,hematochezia, dark tarry stools, constipation No dysuria, malodorous urine, hematuria or flank pain No new skin rashes, lesions, masses or bruises, No new joint pains, aches, swelling or redness No recent unintentional weight gain  No polyuria, polydypsia or polyphagia   Past Medical History:  Diagnosis Date  . Anxiety   . COPD (chronic obstructive pulmonary disease) ([place])   . Emphysema   . Substance abuse ([place])    tobacco dependance    Past Surgical History:  Procedure Laterality Date  . BACK SURGERY    . KNEE SURGERY      Social History   Socioeconomic History  . Marital status: Widowed    Spouse name: Not on file  . Number of children: 2  . Years of education: Not on file  . Highest education level: Not on file  Social Needs  . Financial resource strain: Not on file  . Food insecurity - worry: Not on file  . Food insecurity - inability: Not on file  . Transportation needs - medical: Not on file  . Transportation needs - non-medical: Not on file  Occupational History  . Occupation: Audiological scientist: [employer]  Tobacco Use  . Smoking status: Current Every Day Smoker    Packs/day: 1.00    Years: 42.00    Pack years: 42.00    Types: Cigarettes  .  Smokeless tobacco: Never Used  Substance and Sexual Activity  . Alcohol use: Yes    Alcohol/week: 1.8 oz    Types: 3 Cans of beer per week  . Drug use: No  . Sexual activity: Not on file  Other Topics Concern  . Not on file  Social History Narrative  . 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 Codeine Nausea And Vomiting     Family History  Problem Relation Age of Onset  . Alzheimer's disease Mother   . Cancer Paternal Aunt        colon     Prior to Admission medications   Medication Sig Start Date End Date Taking? Authorizing Provider  albuterol (PROAIR HFA) 108 (90 BASE) MCG/ACT inhaler Inhale 2 puffs into the lungs every 6 (six) hours as needed for wheezing or shortness of breath. 02/09/13  Yes Tanda Rockers, MD  ALPRAZolam Duanne Moron) 0.25 MG tablet Take 0.25 mg by mouth daily as needed for anxiety.    Yes [provider]  budesonide-formoterol (SYMBICORT) 160-4.5 MCG/ACT inhaler Inhale 2 puffs into the lungs 2 (two) times daily. 01/28/17  Yes Tanda Rockers, MD  Cholecalciferol (VITAMIN D) 2000 units tablet Take 6,000 Units by mouth daily.   Yes [provider]  DULoxetine (CYMBALTA) 60 MG capsule Take 60 mg by mouth daily.   Yes [provider]  traMADol (ULTRAM) 50 MG tablet Take 50 mg by mouth every 6 (six) hours as needed. for pain 05/15/17  Yes [provider]  Vitamin D, Ergocalciferol, (DRISDOL) 50000 units CAPS capsule Take 50,000 Units by mouth every 7 (seven) days.   Yes [provider]    Physical Exam: Vitals:   06/17/17 1050 06/17/17 1055 06/17/17 1100 06/17/17 1130  BP: 120/68 128/71 114/64 (!) 141/84  Pulse: (!) 131 (!) 132 (!) 129 (!) 127  Resp: (!) 25 (!) 27 (!) 26 20  Temp:      TempSrc:      SpO2: (!) 88% 95% 96% 94%      Constitutional: NAD, calm, mildly uncomfortable 2/2 sent chest tube placement-appears older than stated age and quite underweight Eyes: PERRL, lids and conjunctivae normal ENMT: Mucous membranes are moist. Posterior pharynx clear of any exudate or lesions. Neck: normal, supple, no masses, no thyromegaly Respiratory: Coarse to auscultation bilaterally, diminished in the bases, a few scattered wheezes.  Left anterior chest tube to Pleur-evac system- normal respiratory effort without accessory muscle use at rest. 3  Liters Cardiovascular: Regular rate and rhythm, no murmurs / rubs / gallops. No extremity edema. 2+ pedal pulses. No carotid bruits.  Abdomen: no tenderness, no masses palpated. No hepatosplenomegaly. Bowel sounds positive.  Musculoskeletal: no clubbing / cyanosis. No joint deformity upper and lower extremities. Good ROM, no contractures. Abormal muscle tone in the context of generalized muscle wasting/cachexia.  Skin: no rashes, lesions, ulcers. No induration Neurologic: CN 2-12 grossly intact. Sensation intact, DTR normal. Strength 4-5/5 x all 4 extremities.  Psychiatric: Normal judgment and insight. Alert and oriented x 3. Normal mood.    Labs on Admission: I have personally reviewed following labs and imaging studies  CBC: Recent Labs  Lab 06/17/17 0934  WBC 14.6*  HGB 14.3  HCT 44.2  MCV 107.0*  PLT 409   Basic Metabolic Panel: Recent Labs  Lab 06/17/17 0934  NA 136  K 4.2  CL  96*  CO2 28  GLUCOSE 166*  BUN 9  CREATININE 0.52  CALCIUM 9.4   GFR: CrCl cannot be calculated (Unknown ideal weight.). Liver Function Tests: No results for input(s): AST, ALT, ALKPHOS, BILITOT, PROT, ALBUMIN in the last 168 hours. No results for input(s): LIPASE, AMYLASE in the last 168 hours. No results for input(s): AMMONIA in the last 168 hours. Coagulation Profile: No results for input(s): INR, PROTIME in the last 168 hours. Cardiac Enzymes: No results for input(s): CKTOTAL, CKMB, CKMBINDEX, TROPONINI in the last 168 hours. BNP (last 3 results) No results for input(s): PROBNP in the last 8760 hours. HbA1C: No results for input(s): HGBA1C in the last 72 hours. CBG: No results for input(s): GLUCAP in the last 168 hours. Lipid Profile: No results for input(s): CHOL, HDL, LDLCALC, TRIG, CHOLHDL, LDLDIRECT in the last 72 hours. Thyroid Function Tests: No results for input(s): TSH, T4TOTAL, FREET4, T3FREE, THYROIDAB in the last 72 hours. Anemia Panel: No results for input(s):  VITAMINB12, FOLATE, FERRITIN, TIBC, IRON, RETICCTPCT in the last 72 hours. Urine analysis: No results found for: COLORURINE, APPEARANCEUR, LABSPEC, PHURINE, GLUCOSEU, HGBUR, BILIRUBINUR, KETONESUR, PROTEINUR, UROBILINOGEN, NITRITE, LEUKOCYTESUR Sepsis Labs: @LABRCNTIP (procalcitonin:4,lacticidven:4) )No results found for this or any previous visit (from the past 240 hour(s)).   Radiological Exams on Admission: Dg Chest Portable 1 View  Result Date: 06/17/2017 CLINICAL DATA:  Chest tube in place EXAM: PORTABLE CHEST 1 VIEW COMPARISON:  06/17/2017 FINDINGS: Interval placement of left chest tube. The pigtail is partially formed at the left apex. Near complete re-expansion of the left lung with only a very small left lateral pneumothorax at the left base. Left base atelectasis. Heart is normal size. IMPRESSION: Interval placement of left chest tube with pigtail partially formed in the left upper chest. Near complete re-expansion of the left lung with only a small residual left lateral basilar pneumothorax. Electronically Signed   By: Rolm Baptise M.D.   On: 06/17/2017 11:33   Dg Chest Portable 1 View  Result Date: 06/17/2017 CLINICAL DATA:  Shortness of breath and low oxygen saturation, with decreased breath sounds on the left. EXAM: PORTABLE CHEST 1 VIEW COMPARISON:  Chest x-ray dated December 17, 2016. FINDINGS: The cardiomediastinal silhouette is normal in size. Normal pulmonary vascularity. New moderate left pneumothorax with slight mediastinal shift to the right. Emphysematous changes in both lungs again noted. 10 mm nodule in the superior segment of the left lower lobe is grossly unchanged. No acute osseous abnormality. IMPRESSION: 1. New moderate left pneumothorax with slight mediastinal shift to the right. 2. COPD.  10 mm nodule in the left lower lobe is grossly unchanged. Critical Value/emergent results were called by telephone at the time of interpretation on 06/17/2017 at 10:23 am to Dr. Duffy Bruce ,  who verbally acknowledged these results. Electronically Signed   By: Titus Dubin M.D.   On: 06/17/2017 10:23    EKG: (Independently reviewed) sinus tachycardia with ventricular rate 131 bpm, QTC 472 ms, borderline voltage criteria for LVH, normal R wave rotation, no acute ischemic changes  Assessment/Plan Principal Problem:    Acute respiratory failure with hypoxia 2/2 Spontaneous pneumothorax -Patient presents with sudden onset chest discomfort and shortness of breath with radiographic evidence of moderate left pneumothorax with essential complete resolution after placement of chest tube in ER -Suspect ruptured bleb as etiology -IS -Combination narcotic and nonnarcotic analgesia -Management per CVTS  Active Problems:   COPD with emphysema  -Stable and without acute exacerbation -New onset cough without fever or chills  and no evidence of pneumonia on chest x-ray -Possible early viral syndrome so check respiratory viral panel -With minor respiratory symptoms and requirement for chest tube we will utilize empiric Levaquin especially with presentation of leukocytosis -Blood cultures, sputum culture although anticipate will be negative -Continue preadmission Symbicort (use therapeutic substitution Dulera) -No indication for steroids at this juncture -Follows with 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  -Patient reports 20 pound weight loss over the past 12 months -Continue preadmission protein beverages -Nutrition consultation    Anxiety disorder -Continue preadmission Xanax -Continue Cymbalta if still taking prior to admission    Osteoporosis -Apparent new diagnosis -Has been prescribed Fosamax but has not taken yet -Discussed necessity to begin this medication and determine if can tolerate  noting multiple risk factors for worsening osteoporosis: Postmenopausal, history of frequent steroid pulse tapers, protein calorie malnutrition; also discuss unwanted sequelae such as spontaneous spinal compression fractures, wrist or hip fractures with falls, spontaneous rib fractures and patient w/ COPD and coughing -Patient aware other drugs are available if unable to tolerate Fosamax      DVT prophylaxis: Lovenox Code Status: Full Family Communication: Daughter Disposition Plan: Home Consults called: CVTS/Gerhardt    Kyung Muto L. ANP-BC [facility] Pager [phone]   If 7PM-7AM, please contact night-coverage [email or web address] Password [facility]  06/17/2017, 12:47 PM

## 2017-06-17 NOTE — ED Provider Notes (Signed)
[facility] EMERGENCY DEPARTMENT Provider Note   CSN: [id number] Arrival date & time: 06/17/17  1700     History   Chief Complaint Chief Complaint  Patient presents with  . Chest Pain  . Shortness of Breath    HPI Darlene [name] is a 62 y.o. female.  Patient with history of chronic obstructive pulmonary disease presents to the emergency department today with complaint of acute onset of left-sided chest pain and shortness of breath.  This occurred while patient was at work.  She was brought to the emergency department and found to be hypoxic and tachycardic.  Chest x-ray showed pneumothorax.  Patient denies any recent fevers.  She has a chronic cough and unchanged sputum production.  No nausea, vomiting, or diarrhea.  She does not use oxygen at home. The onset of this condition was acute. The course is constant. Aggravating factors: none. Alleviating factors: none.        Past Medical History:  Diagnosis Date  . Anxiety   . COPD (chronic obstructive pulmonary disease) ([place])   . Emphysema   . Substance abuse ([place])    tobacco dependance    Patient Active Problem List   Diagnosis Date Noted  . Anxiety   . COPD (chronic obstructive pulmonary disease) ([place])   . Substance abuse ([place])   . Lesion of left lung 07/10/2014  . Cough 03/17/2013  . Chest pain 03/17/2013  . Cigarette smoker 12/30/2012  . COPD GOLD III/ still smoking  12/29/2012    Past Surgical History:  Procedure Laterality Date  . BACK SURGERY    . KNEE SURGERY      OB History    No data available       Home Medications    Prior to Admission medications   Medication Sig Start Date End Date Taking? Authorizing Provider  albuterol (PROAIR HFA) 108 (90 BASE) MCG/ACT inhaler Inhale 2 puffs into the lungs every 6 (six) hours as needed for wheezing or shortness of breath. 02/09/13   Tanda Rockers, MD  ALPRAZolam Duanne Moron) 0.25 MG tablet Take 0.25 mg by mouth every 8 (eight) hours as  needed for anxiety.    [provider]  budesonide-formoterol (SYMBICORT) 160-4.5 MCG/ACT inhaler Inhale 2 puffs into the lungs 2 (two) times daily. 01/28/17   Tanda Rockers, MD  DULoxetine (CYMBALTA) 20 MG capsule Take 30 mg by mouth daily.     [provider]  Vitamin D, Ergocalciferol, (DRISDOL) 50000 units CAPS capsule Take 50,000 Units by mouth every 7 (seven) days.    [provider]    Family History Family History  Problem Relation Age of Onset  . Alzheimer's disease Mother   . Cancer Paternal Aunt        colon    Social History Social History   Tobacco Use  . Smoking status: Current Every Day Smoker    Packs/day: 1.00    Years: 42.00    Pack years: 42.00    Types: Cigarettes  . Smokeless tobacco: Never Used  Substance Use Topics  . Alcohol use: Yes    Alcohol/week: 1.8 oz    Types: 3 Cans of beer per week  . Drug use: No     Allergies   Codeine   Review of Systems Review of Systems  Constitutional: Negative for fever.  HENT: Negative for rhinorrhea and sore throat.   Eyes: Negative for redness.  Respiratory: Positive for cough and shortness of breath.   Cardiovascular: Positive  for chest pain. Negative for leg swelling.  Gastrointestinal: Negative for abdominal pain, diarrhea, nausea and vomiting.  Genitourinary: Negative for dysuria.  Musculoskeletal: Negative for myalgias.  Skin: Negative for rash.  Neurological: Negative for headaches.     Physical Exam Updated Vital Signs BP 128/71   Pulse (!) 132   Temp 98.9 F (37.2 C) (Oral)   Resp (!) 27   SpO2 95%   Physical Exam  Constitutional: She appears well-developed and well-nourished.  HENT:  Head: Normocephalic and atraumatic.  Eyes: Conjunctivae are normal. Right eye exhibits no discharge. Left eye exhibits no discharge.  Neck: Normal range of motion. Neck supple.  Cardiovascular: Regular rhythm and normal heart sounds. Tachycardia present.  Pulmonary/Chest:  Effort normal. She has decreased breath sounds (absent breath sounds on left). She has wheezes (scattered on right). She has no rhonchi. She has no rales.  Abdominal: Soft. There is no tenderness.  Musculoskeletal:       Right lower leg: She exhibits no tenderness and no edema.       Left lower leg: She exhibits no tenderness and no edema.  Neurological: She is alert.  Skin: Skin is warm and dry.  Psychiatric: She has a normal mood and affect.  Nursing note and vitals reviewed.    ED Treatments / Results  Labs (all labs ordered are listed, but only abnormal results are displayed) Labs Reviewed  BASIC METABOLIC PANEL - Abnormal; Notable for the following components:      Result Value   Chloride 96 (*)    Glucose, Bld 166 (*)    All other components within normal limits  CBC - Abnormal; Notable for the following components:   WBC 14.6 (*)    MCV 107.0 (*)    MCH 34.6 (*)    All other components within normal limits  I-STAT TROPONIN, ED    ED ECG REPORT   Date: 06/17/2017  Rate: 131  Rhythm: sinus tachycardia  QRS Axis: normal  Intervals: normal  ST/T Wave abnormalities: non-specific t-wave changes  Conduction Disutrbances:none  Narrative Interpretation:   Old EKG Reviewed: unchanged (tachy 110 on previous)  I have personally reviewed the EKG tracing and agree with the computerized printout as noted.   Radiology Dg Chest Portable 1 View  Result Date: 06/17/2017 CLINICAL DATA:  Chest tube in place EXAM: PORTABLE CHEST 1 VIEW COMPARISON:  06/17/2017 FINDINGS: Interval placement of left chest tube. The pigtail is partially formed at the left apex. Near complete re-expansion of the left lung with only a very small left lateral pneumothorax at the left base. Left base atelectasis. Heart is normal size. IMPRESSION: Interval placement of left chest tube with pigtail partially formed in the left upper chest. Near complete re-expansion of the left lung with only a small residual  left lateral basilar pneumothorax. Electronically Signed   By: Rolm Baptise M.D.   On: 06/17/2017 11:33   Dg Chest Portable 1 View  Result Date: 06/17/2017 CLINICAL DATA:  Shortness of breath and low oxygen saturation, with decreased breath sounds on the left. EXAM: PORTABLE CHEST 1 VIEW COMPARISON:  Chest x-ray dated December 17, 2016. FINDINGS: The cardiomediastinal silhouette is normal in size. Normal pulmonary vascularity. New moderate left pneumothorax with slight mediastinal shift to the right. Emphysematous changes in both lungs again noted. 10 mm nodule in the superior segment of the left lower lobe is grossly unchanged. No acute osseous abnormality. IMPRESSION: 1. New moderate left pneumothorax with slight mediastinal shift to the right. 2.  COPD.  10 mm nodule in the left lower lobe is grossly unchanged. Critical Value/emergent results were called by telephone at the time of interpretation on 06/17/2017 at 10:23 am to Dr. Duffy Bruce , who verbally acknowledged these results. Electronically Signed   By: Titus Dubin M.D.   On: 06/17/2017 10:23    Procedures CHEST TUBE INSERTION Date/Time: 06/17/2017 11:00 AM Performed by: Carlisle Cater, PA-C Authorized by: Carlisle Cater, PA-C   Consent:    Consent obtained:  Verbal and written   Consent given by:  Patient   Risks discussed:  Bleeding, damage to surrounding structures, infection, incomplete drainage, pain and nerve damage   Alternatives discussed:  No treatment Pre-procedure details:    Skin preparation:  Hibiclens and Betadine Sedation:    Sedation type: morphine for pain. Anesthesia (see MAR for exact dosages):    Anesthesia method:  Local infiltration   Local anesthetic:  Lidocaine 2% w/o epi Procedure details:    Placement location:  L lateral   Scalpel size:  11   Tube size ([place]): 14.   Tension pneumothorax: no     Tube connected to:  Suction   Drainage characteristics:  Air only   Suture material: 3-0 silk.   Dressing:   Petrolatum-impregnated gauze and 4x4 sterile gauze Post-procedure details:    Post-insertion x-ray findings: tube in good position     Patient tolerance of procedure:  Tolerated well, no immediate complications    (including critical care time)  Medications Ordered in ED Medications  lidocaine-EPINEPHrine (XYLOCAINE W/EPI) 2 %-1:200000 (PF) injection 10 mL (10 mLs Intradermal Given 06/17/17 1046)  morphine 4 MG/ML injection (4 mg  Given 06/17/17 1045)  ipratropium-albuterol (DUONEB) 0.5-2.5 (3) MG/3ML nebulizer solution 3 mL (3 mLs Nebulization Given 06/17/17 1153)     Initial Impression / Assessment and Plan / ED Course  I have reviewed the triage vital signs and the nursing notes.  Pertinent labs & imaging results that were available during my care of the patient were reviewed by me and considered in my medical decision making (see chart for details).     Patient seen and examined. Portable x-ray demonstrated moderate L sided pneumo.  Hypoxic and tachycardic on arrival, improved with nasal cannula O2 to low 90s.  Patient speaking in complete sentences.  Vital signs reviewed and are as follows: BP 128/71   Pulse (!) 132   Temp 98.9 F (37.2 C) (Oral)   Resp (!) 27   SpO2 95%   Pt discussed with and seen by Dr. Ellender Hose.  Discussed procedure of chest tube with patient.  She agrees to proceed and was consented.  Chest tube placed by myself Dr. Ellender Hose.  X-ray with near-complete re-expansion. Pt doing very well.   11:50 AM Spoke with OR RN. Dr. Servando Snare is in surgery.   Callback states CT surg PA to see patient. Asked for hospitalist consult.   12:19 PM Spoke with [facility] who will see.   CRITICAL CARE Performed by: Faustino Congress Total critical care time: 45 minutes Critical care time was exclusive of separately billable procedures and treating other patients. Critical care was necessary to treat or prevent imminent or life-threatening deterioration. Critical care was time  spent personally by me on the following activities: development of treatment plan with patient and/or surrogate as well as nursing, discussions with consultants, evaluation of patient's response to treatment, examination of patient, obtaining history from patient or surrogate, ordering and performing treatments and interventions, ordering and review of laboratory studies, ordering  and review of radiographic studies, pulse oximetry and re-evaluation of patient's condition.   Final Clinical Impressions(s) / ED Diagnoses   Final diagnoses:  Spontaneous pneumothorax  Pulmonary emphysema, unspecified emphysema type ([place])   Admit.   ED Discharge Orders    None       Carlisle Cater, Hershal Coria 06/17/17 1219    Duffy Bruce, MD 06/17/17 [phone]

## 2017-06-17 NOTE — ED Notes (Signed)
Patient states she is feeling and breathing so much better.

## 2017-06-17 NOTE — ED Notes (Addendum)
MD Issacs and PA Josh at bedside prepping patient for chest tube insertion. Consent obtained prior. Patient VSS. Remains a/o x4.

## 2017-06-18 ENCOUNTER — Encounter (HOSPITAL_COMMUNITY): Payer: Self-pay | Admitting: *Deleted

## 2017-06-18 ENCOUNTER — Inpatient Hospital Stay (HOSPITAL_COMMUNITY): Payer: BC Managed Care – PPO

## 2017-06-18 DIAGNOSIS — J449 Chronic obstructive pulmonary disease, unspecified: Principal | ICD-10-CM

## 2017-06-18 DIAGNOSIS — F419 Anxiety disorder, unspecified: Secondary | ICD-10-CM

## 2017-06-18 DIAGNOSIS — J9601 Acute respiratory failure with hypoxia: Secondary | ICD-10-CM

## 2017-06-18 DIAGNOSIS — Z720 Tobacco use: Secondary | ICD-10-CM

## 2017-06-18 DIAGNOSIS — E43 Unspecified severe protein-calorie malnutrition: Secondary | ICD-10-CM

## 2017-06-18 DIAGNOSIS — Z9689 Presence of other specified functional implants: Secondary | ICD-10-CM

## 2017-06-18 DIAGNOSIS — M810 Age-related osteoporosis without current pathological fracture: Secondary | ICD-10-CM

## 2017-06-18 DIAGNOSIS — J439 Emphysema, unspecified: Secondary | ICD-10-CM

## 2017-06-18 DIAGNOSIS — R911 Solitary pulmonary nodule: Secondary | ICD-10-CM

## 2017-06-18 DIAGNOSIS — F17210 Nicotine dependence, cigarettes, uncomplicated: Secondary | ICD-10-CM

## 2017-06-18 DIAGNOSIS — J9311 Primary spontaneous pneumothorax: Secondary | ICD-10-CM

## 2017-06-18 DIAGNOSIS — R64 Cachexia: Secondary | ICD-10-CM

## 2017-06-18 LAB — COMPREHENSIVE METABOLIC PANEL
ALK PHOS: 61 U/L (ref 38–126)
ALT: 11 U/L — ABNORMAL LOW (ref 14–54)
AST: 16 U/L (ref 15–41)
Albumin: 2.8 g/dL — ABNORMAL LOW (ref 3.5–5.0)
Anion gap: 7 (ref 5–15)
BILIRUBIN TOTAL: 1 mg/dL (ref 0.3–1.2)
BUN: <5 mg/dL — ABNORMAL LOW (ref 6–20)
CALCIUM: 9.5 mg/dL (ref 8.9–10.3)
CO2: 31 mmol/L (ref 22–32)
CREATININE: 0.39 mg/dL — AB (ref 0.44–1.00)
Chloride: 99 mmol/L — ABNORMAL LOW (ref 101–111)
Glucose, Bld: 90 mg/dL (ref 65–99)
Potassium: 3.4 mmol/L — ABNORMAL LOW (ref 3.5–5.1)
Sodium: 137 mmol/L (ref 135–145)
TOTAL PROTEIN: 5.9 g/dL — AB (ref 6.5–8.1)

## 2017-06-18 LAB — RESPIRATORY PANEL BY PCR

## 2017-06-18 LAB — CBC
HCT: 38.1 % (ref 36.0–46.0)
Hemoglobin: 12.1 g/dL (ref 12.0–15.0)
MCH: 34.1 pg — AB (ref 26.0–34.0)
MCHC: 31.8 g/dL (ref 30.0–36.0)
MCV: 107.3 fL — ABNORMAL HIGH (ref 78.0–100.0)
PLATELETS: 277 10*3/uL (ref 150–400)
RBC: 3.55 MIL/uL — AB (ref 3.87–5.11)
RDW: 12.1 % (ref 11.5–15.5)
WBC: 16.2 10*3/uL — AB (ref 4.0–10.5)

## 2017-06-18 LAB — STREP PNEUMONIAE URINARY ANTIGEN: Strep Pneumo Urinary Antigen: NEGATIVE

## 2017-06-18 LAB — HIV ANTIBODY (ROUTINE TESTING W REFLEX): HIV SCREEN 4TH GENERATION: NONREACTIVE

## 2017-06-18 MED ORDER — GUAIFENESIN ER 600 MG PO TB12
1200.0000 mg | ORAL_TABLET | Freq: Two times a day (BID) | ORAL | Status: DC
  Administered 2017-06-18 – 2017-06-21 (×7): 1200 mg via ORAL
  Filled 2017-06-18 (×7): qty 2

## 2017-06-18 MED ORDER — POTASSIUM CHLORIDE CRYS ER 20 MEQ PO TBCR
40.0000 meq | EXTENDED_RELEASE_TABLET | Freq: Two times a day (BID) | ORAL | Status: AC
  Administered 2017-06-18 (×2): 40 meq via ORAL
  Filled 2017-06-18 (×2): qty 2

## 2017-06-18 MED ORDER — SODIUM CHLORIDE 0.9 % IV SOLN
INTRAVENOUS | Status: DC
  Administered 2017-06-18: 75 mL/h via INTRAVENOUS

## 2017-06-18 MED ORDER — SODIUM CHLORIDE 0.9 % IV BOLUS (SEPSIS)
1000.0000 mL | Freq: Once | INTRAVENOUS | Status: AC
  Administered 2017-06-18: 1000 mL via INTRAVENOUS

## 2017-06-18 MED ORDER — PNEUMOCOCCAL VAC POLYVALENT 25 MCG/0.5ML IJ INJ
0.5000 mL | INJECTION | INTRAMUSCULAR | Status: AC
  Administered 2017-06-21: 0.5 mL via INTRAMUSCULAR
  Filled 2017-06-18: qty 0.5

## 2017-06-18 NOTE — Progress Notes (Signed)
Initial Nutrition Assessment  DOCUMENTATION CODES:  Severe malnutrition in context of chronic illness, Underweight  INTERVENTION:  Ensure Enlive po BID, each supplement provides 350 kcal and 20 grams of protein (already on)  Magic cup BID with meals, each supplement provides 290 kcal and 9 grams of protein  Gave diet education to help patient maintain weight/muscle  Please note pt is quite depressed due to her poor health and inability to do much of anything at home w/o having severe SOB, she says her house has fallen into dissaray  NUTRITION DIAGNOSIS:  Severe Malnutrition related to catabolic illness, poor appetite as evidenced by an estimated PO intake that has met </= to 75% of needs for >/= to 1 month and severe muscle/fat wasting  GOAL:  Patient will meet greater than or equal to 90% of their needs  MONITOR:  PO intake, Supplement acceptance, Labs, Weight trends  REASON FOR ASSESSMENT:  Consult Assessment of nutrition requirement/status  ASSESSMENT:  62 y/o female Pmhx Severe COPD (not on 02), continued tobacco abuse, anxiety. Presented w/ productive cough and new sudden L side chest pain w/ associated SOB. Xray revealed pneumothorax s/p chest tube placement. Admitted for management.   Pt reports that she lives on her own. She has become so debilitated by her SOB that he is unable to do much of anything. She cannot take the trash out, clean or make meals anymore. She says her house has fallen into disarray. Her inability to do anything has her very depressed.   Nutritionally, she only eats 1 meal a day. It is not very large and it is typically "junk food". She snacks throughout the day on junk items as well. She admits many of the items she consumes have very low protein (grapes/chips). She does drink Ensure, but consumes "at most" 2/day, because they tend to make her constipated. She does not take softeners or laxatives. She does take calcium + D. She says she was found to be  Vit D deficient.    She has chronic anorexia. She does not desire any food and "only eat because I know I need to". She has noticed nonspecific intermittent nausea now as well  Weight wise, she says she has lost 20 lbs x1 year, though documentation shows she weighed 104 >2 years ago, and had actually been weighing in the low 90's in 2014. No significant wt loss.   However, she is still emaciated. Has severe lower body upper/lower muscle wasting as well as severe fat wasting. Her face shows only mild muscle/fat wasting. No edema.   RD reviewed how severe COPD will affect her appetite greatly due to her body focusing on breathing vs eating. As such, her body has become used to eating such small amounts that she now can only tolerate small bits of food and quickly becomes satiatied.   RD reviewed diet at home to help her combat this. Discussed importance of frequent intake during day of high protein foods. Listed good convenience items: Hard boiled eggs, cheese sticks, PB.   She was agreeable to supplements while admitted.   Labs: Albumin: 2.8, WBC: 16.2, K:3.4,  Meds: Ensure Enlive BID, KCL, Vit D, IV abx, prn Xanax, prn zofran, oxycodone prn  NUTRITION - FOCUSED PHYSICAL EXAM:   Most Recent Value  Orbital Region  Mild depletion  Upper Arm Region  Severe depletion  Thoracic and Lumbar Region  Severe depletion  Buccal Region  Mild depletion  Temple Region  Mild depletion  Clavicle Bone Region  Moderate depletion  Clavicle and Acromion Bone Region  Moderate depletion  Scapular Bone Region  Unable to assess  Dorsal Hand  Severe depletion  Patellar Region  Severe depletion  Anterior Thigh Region  Severe depletion  Posterior Calf Region  Severe depletion       Diet Order:  Diet regular Room service appropriate? Yes; Fluid consistency: Thin  EDUCATION NEEDS:   Education needs have been addressed  Skin:  Skin Assessment: Reviewed RN Assessment  Last BM:  Unknown  Height:  Ht  Readings from Last 1 Encounters:  06/17/17 5' 3"  (1.6 m)   Weight:  Wt Readings from Last 1 Encounters:  06/18/17 85 lb 8.6 oz (38.8 kg)   Wt Readings from Last 10 Encounters:  06/18/17 85 lb 8.6 oz (38.8 kg)  01/28/17 88 lb 9.6 oz (40.2 kg)  08/04/16 93 lb (42.2 kg)  04/22/15 102 lb (46.3 kg)  03/11/15 104 lb (47.2 kg)  08/07/14 104 lb (47.2 kg)  07/24/14 104 lb (47.2 kg)  03/17/13 94 lb (42.6 kg)  02/02/13 91 lb (41.3 kg)  12/29/12 92 lb 9.6 oz (42 kg)   Ideal Body Weight:  52.27 kg  BMI:  Body mass index is 15.15 kg/m.  Estimated Nutritional Needs:  Kcal:  >1550 kcals (40 kcal/kg bw) Protein:  58-78g Pro (1.5-2 g/kg bw) Fluid:  1.2-1.4 L (30-25 ml/kg bw)  Burtis Junes RD, LDN, CNSC Clinical Nutrition Pager: [phone] 06/18/2017 12:43 PM

## 2017-06-18 NOTE — Assessment & Plan Note (Addendum)
A cavitary 2 cm right upper lobe lung nodule was noticed in July 2018 Worse on CT 06/18/17 and also new cavity smaller LLL    Plan -d/w patient - she does NOT want bx for this; understands if this is lung cancer she wants natural course oflung cancer to consume her. Prefers quality of life  - will do ANA, ANCA, GBM and Quant Gold to elucidate alternate etiologies

## 2017-06-18 NOTE — Progress Notes (Signed)
PROGRESS NOTE    Darlene [name]  QQI:[id number] DOB: Mar 24, 1956 DOA: 06/17/2017 PCP: [name], MD   Brief Narrative:  Darlene [name] is a 62 y.o. 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 She Presented to the ER today with reports of productive cough with new left-sided chest pain radiating to the lateral chest towards the back. This is also associated with shortness of breath.  Symptoms were of sudden onset today.  Room air pulse oximetry was documented as "low" in triage and she was subsequently placed on 3 L nasal cannula oxygen with pulse oximetry increasing to 95%.  X-ray performed in the ER revealed new moderate left pneumothorax with slight mediastinal shift to the right.  White count 14,600 differential not obtained.  Subsequently a chest tube has been placed by EDP with near complete reexpansion of the left lung with only a very small left lateral pneumothorax at the left base.  CVTS/Gerhardt evaluated and felt as if there was no air leak and recommended Pulmonary Evaluation.   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 Chase Caller  consulted for further evaluation and recommendations and recommending continuing Chest Tube for 1 day with suction and then 1 day without for 3 days prior for evaluation -? Considering VATS Pleurodesis/Blebectomy vs Chemical Pleurodesis  -Repeat CXR this AM showed Left-sided chest tube with the tip at the apex with a tiny pneumothorax along the left costophrenic angle which has decreased in size compared with the prior exam. -Pulmonary obtaining CT of Chest   COPD with emphysema  -New onset cough without fever or chills and no evidence of pneumonia on chest x-ray -Possible early viral syndrome so checked respiratory viral panel but Negative -With minor respiratory symptoms and requirement for chest tube we will utilize empiric Levaquin especially with presentation of leukocytosis and continue for now  -Blood cultures Show NGTD <24 Hours, sputum culture although anticipate will be negative -Continue preadmission Symbicort (use therapeutic substitution Dulera) -Started Guaifenesin 1200 mg po BID -No indication for steroids at this juncture -Follows with 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 Related to 2 cm RUL Lung Nodule -Continue preadmission protein beverages -Nutrition consultation -Recommending Ensure Enlive po BID and Magic Cup BID  Anxiety and Depression  -Continue preadmission Alprazolam 0.24 mg po q8hprn -Continue Cymbalta if still taking prior to admission  Osteoporosis -Apparent new diagnosis -Has been prescribed Fosamax but has not taken yet -Discussed necessity to begin this medication and determine  if can tolerate  noting multiple risk factors for worsening osteoporosis: Postmenopausal, history of frequent steroid pulse tapers, protein calorie malnutrition; also discuss unwanted sequelae such as spontaneous spinal compression fractures, wrist or hip fractures with falls, spontaneous rib fractures and patient w/ COPD and coughing -C/w Vitamin D 50,000 units po q7day -Patient aware other drugs are available if unable to tolerate Fosamax  Right Lung Apical Nodule -Seen previously in June -Pulmonary obtaining CT of Chest w/o Contrast  Hypokalemia  -Patient's K+ was 3.4  -Replete -Continue to Monitor and Replete as Necessary -Repeat CMP in AM   Leukocytosis -Worsened from yesterday -?Reactive -C/w Abx -Blood Cx x2 show NGTD <24 hours -Continue to Monitor for S/Sx of Infection -Repeat CXR in AM and Obtaining CT of Chest w/o Contrast  Sinus Tachycardia -? From Respiratory Issues vs. Pain -Given IV bolus 1 Liter; Start Maintenance IVF at NS 75 mL/hr -Continue to Monitor with Telemetry   DVT prophylaxis: SCDs Code Status: FULL CODE Family Communication: No family present at bedside Disposition Plan: Remain Inpatient   Consultants:   Cardiothoracic Surgery  Pulmonary   Procedures:  Left Chest Tube    Antimicrobials:  Anti-infectives (From admission, onward)   Start     Dose/Rate Route Frequency Ordered Stop   06/17/17 1245  levofloxacin (LEVAQUIN) IVPB 750 mg     750 mg 100 mL/hr over 90 Minutes Intravenous Every 24 hours 06/17/17 1242 06/22/17 1244     Subjective: Seen and examined and had a lot of pain especially when trying to cough. Unable to have productive sputum because of pain. Feels SOB Slightly. No other concerns or complaints at this time.   Objective: Vitals:   06/17/17 1930 06/17/17 2002 06/17/17 2012 06/18/17 0541  BP: 118/84  134/81 135/67  Pulse: (!) 115  (!) 109 (!) 116  Resp: (!) 22  20 (!) 24  Temp:    98.8 F (37.1 C)  TempSrc:    Oral  SpO2: 98%  91% 94%    Weight:  38.9 kg (85 lb 11.2 oz)  38.8 kg (85 lb 8.6 oz)  Height:  5\' 3"  (1.6 m)      Intake/Output Summary (Last 24 hours) at 06/18/2017 0847 Last data filed at 06/18/2017 0600 Gross per 24 hour  Intake 153 ml  Output 1200 ml  Net -1047 ml   Filed Weights   06/17/17 2002 06/18/17 0541  Weight: 38.9 kg (85 lb 11.2 oz) 38.8 kg (85 lb 8.6 oz)   Examination: Physical Exam:  Constitutional: Thin and cachectic appearing female in NAD and appears calm but uncomfortable Eyes: Lids and conjunctivae normal, sclerae anicteric  ENMT: External Ears, Nose appear normal. Grossly normal hearing. Mucous membranes are moist.  Neck: Appears normal, supple, no cervical masses, normal ROM, no appreciable thyromegaly; no JVD Respiratory: Diminished to auscultation bilaterally, no wheezing, rales, rhonchi or crackles. Normal respiratory effort and patient is not tachypenic. No accessory muscle use.  Cardiovascular: Tachycardic Rate and rhythm, no murmurs / rubs / gallops. S1 and S2 auscultated. Left Sided Chest Tube in Place.  Abdomen: Soft, non-tender, non-distended. No masses palpated. No appreciable hepatosplenomegaly. Bowel sounds positive x4.  GU: Deferred. Musculoskeletal: No clubbing / cyanosis of digits/nails. No joint deformity upper and lower extremities. Skin: No rashes, lesions, ulcers on a limited skin . No induration; Warm and dry.  Neurologic: CN 2-12 grossly intact with no focal deficits. Romberg sign and cerebellar reflexes not assessed.  Psychiatric: Normal judgment and insight. Alert and oriented x  3. Normal mood and appropriate affect.   Data Reviewed: I have personally reviewed following labs and imaging studies  CBC: Recent Labs  Lab 06/17/17 0934 06/18/17 0311  WBC 14.6* 16.2*  HGB 14.3 12.1  HCT 44.2 38.1  MCV 107.0* 107.3*  PLT 328 093   Basic Metabolic Panel: Recent Labs  Lab 06/17/17 0934 06/18/17 0311  NA 136 137  K 4.2 3.4*  CL 96* 99*  CO2 28 31  GLUCOSE  166* 90  BUN 9 <5*  CREATININE 0.52 0.39*  CALCIUM 9.4 9.5   GFR: Estimated Creatinine Clearance: 44.7 mL/min (A) (by C-G formula based on SCr of 0.39 mg/dL (L)). Liver Function Tests: Recent Labs  Lab 06/18/17 0311  AST 16  ALT 11*  ALKPHOS 61  BILITOT 1.0  PROT 5.9*  ALBUMIN 2.8*   No results for input(s): LIPASE, AMYLASE in the last 168 hours. No results for input(s): AMMONIA in the last 168 hours. Coagulation Profile: No results for input(s): INR, PROTIME in the last 168 hours. Cardiac Enzymes: No results for input(s): CKTOTAL, CKMB, CKMBINDEX, TROPONINI in the last 168 hours. BNP (last 3 results) No results for input(s): PROBNP in the last 8760 hours. HbA1C: No results for input(s): HGBA1C in the last 72 hours. CBG: No results for input(s): GLUCAP in the last 168 hours. Lipid Profile: No results for input(s): CHOL, HDL, LDLCALC, TRIG, CHOLHDL, LDLDIRECT in the last 72 hours. Thyroid Function Tests: No results for input(s): TSH, T4TOTAL, FREET4, T3FREE, THYROIDAB in the last 72 hours. Anemia Panel: No results for input(s): VITAMINB12, FOLATE, FERRITIN, TIBC, IRON, RETICCTPCT in the last 72 hours. Sepsis Labs: No results for input(s): PROCALCITON, LATICACIDVEN in the last 168 hours.  Recent Results (from the past 240 hour(s))  Culture, blood (routine x 2) Call MD if unable to obtain prior to antibiotics being given     Status: None (Preliminary result)   Collection Time: 06/17/17  1:15 PM  Result Value Ref Range Status   Specimen Description BLOOD LEFT ARM  Final   Special Requests   Final    BOTTLES DRAWN AEROBIC AND ANAEROBIC Blood Culture adequate volume   Culture PENDING  Incomplete   Report Status PENDING  Incomplete  Respiratory Panel by PCR     Status: None   Collection Time: 06/18/17  2:50 AM  Result Value Ref Range Status   Adenovirus NOT DETECTED NOT DETECTED Final   Coronavirus 229E NOT DETECTED NOT DETECTED Final   Coronavirus HKU1 NOT DETECTED NOT  DETECTED Final   Coronavirus NL63 NOT DETECTED NOT DETECTED Final   Coronavirus OC43 NOT DETECTED NOT DETECTED Final   Metapneumovirus NOT DETECTED NOT DETECTED Final   Rhinovirus / Enterovirus NOT DETECTED NOT DETECTED Final   Influenza A NOT DETECTED NOT DETECTED Final   Influenza B NOT DETECTED NOT DETECTED Final   Parainfluenza Virus 1 NOT DETECTED NOT DETECTED Final   Parainfluenza Virus 2 NOT DETECTED NOT DETECTED Final   Parainfluenza Virus 3 NOT DETECTED NOT DETECTED Final   Parainfluenza Virus 4 NOT DETECTED NOT DETECTED Final   Respiratory Syncytial Virus NOT DETECTED NOT DETECTED Final   Bordetella pertussis NOT DETECTED NOT DETECTED Final   Chlamydophila pneumoniae NOT DETECTED NOT DETECTED Final   Mycoplasma pneumoniae NOT DETECTED NOT DETECTED Final    Radiology Studies: Dg Chest Portable 1 View  Result Date: 06/17/2017 CLINICAL DATA:  Chest tube in place EXAM: PORTABLE CHEST 1 VIEW COMPARISON:  06/17/2017 FINDINGS: Interval placement of left chest tube. The  pigtail is partially formed at the left apex. Near complete re-expansion of the left lung with only a very small left lateral pneumothorax at the left base. Left base atelectasis. Heart is normal size. IMPRESSION: Interval placement of left chest tube with pigtail partially formed in the left upper chest. Near complete re-expansion of the left lung with only a small residual left lateral basilar pneumothorax. Electronically Signed   By: Rolm Baptise M.D.   On: 06/17/2017 11:33   Dg Chest Portable 1 View  Result Date: 06/17/2017 CLINICAL DATA:  Shortness of breath and low oxygen saturation, with decreased breath sounds on the left. EXAM: PORTABLE CHEST 1 VIEW COMPARISON:  Chest x-ray dated December 17, 2016. FINDINGS: The cardiomediastinal silhouette is normal in size. Normal pulmonary vascularity. New moderate left pneumothorax with slight mediastinal shift to the right. Emphysematous changes in both lungs again noted. 10 mm nodule  in the superior segment of the left lower lobe is grossly unchanged. No acute osseous abnormality. IMPRESSION: 1. New moderate left pneumothorax with slight mediastinal shift to the right. 2. COPD.  10 mm nodule in the left lower lobe is grossly unchanged. Critical Value/emergent results were called by telephone at the time of interpretation on 06/17/2017 at 10:23 am to Dr. Duffy Bruce , who verbally acknowledged these results. Electronically Signed   By: Titus Dubin M.D.   On: 06/17/2017 10:23   Scheduled Meds: . DULoxetine  30 mg Oral Daily  . enoxaparin (LOVENOX) injection  30 mg Subcutaneous Q24H  . feeding supplement (ENSURE ENLIVE)  237 mL Oral BID BM   Or  . feeding supplement  1 Container Oral BID BM  . ipratropium-albuterol  3 mL Nebulization TID  . mometasone-formoterol  2 puff Inhalation BID  . nicotine  21 mg Transdermal Daily  . [START ON 06/19/2017] pneumococcal 23 valent vaccine  0.5 mL Intramuscular Tomorrow-1000  . potassium chloride  40 mEq Oral BID  . sodium chloride flush  3 mL Intravenous Q12H  . Vitamin D (Ergocalciferol)  50,000 Units Oral Q7 days   Continuous Infusions: . levofloxacin (LEVAQUIN) IV Stopped (06/17/17 1516)    LOS: 1 day   Kerney Elbe, DO [facility] Pager [phone]  If 7PM-7AM, please contact night-coverage [email or web address] Password [facility] 06/18/2017, 8:47 AM

## 2017-06-18 NOTE — Assessment & Plan Note (Addendum)
Triple inhaler/bronchodilator therapy while in the hospital and oxygen for pulse ox greater than 88%.  COPD stable currently without any exacerbation. Will add spiriva 06/19/2017. Continue dulera in lieu of home symbicort

## 2017-06-18 NOTE — Assessment & Plan Note (Signed)
Encouraged to quit smoking.  

## 2017-06-18 NOTE — Assessment & Plan Note (Addendum)
There is a secondary  spontaneous pneumothorax due to her advanced COPD/emphysema.  S/p chest tube 06/17/2017 on as of 06/19/17 on 48h chest tube with suction. CT chest 06/18/17 with near total resolutions    Plan -Continue chest tube but dc wall suction  - REasess CXR off walk suction 06/20/17 ( total of 3 days of chest tube management before deciding next step because of advanced COPD and a high risk for lack of healing] - Call CVTS 06/20/17 to discuss VATS pleurodesis/blebectomy (meets grade 1 recommendation indication for VATS pleurodesis/blebectomy v chest tube based pleurodesis) due to high risk of recurrence.   I d/w with Leda Gauze

## 2017-06-18 NOTE — Consult Note (Signed)
PULMONARY / CRITICAL CARE MEDICINE   Name: Darlene [name] MRN: [id number] DOB: [birth year] PCP [name], MD LOS 1 as of 06/18/2017     ADMISSION DATE:  06/17/2017 CONSULTATION DATE:  06/18/2017   REFERRING MD:  Triad MD  CHIEF COMPLAINT:  Left spontaneous pneumothorax secondary to COPD  HISTORY OF PRESENT ILLNESS:   62 year old female with advanced COPD followed by Dr. Christinia Gully, February 2016 pulmonary function test shows Gold stage III COPD with low DLCO as documented below.  She is also known to have a 2 cm right upper lobe lung nodule that is cavitary based on July 2018 CT scan of the chest.  She is not on oxygen at home but on triple inhaler therapy.  She is a 63 pack smoking history and continues to smoke.  She presented on June 17, 2017 with sudden onset of chest pain and noticed to have a greater than 20% left-sided secondary spontaneous pneumothorax.  She is status post small bore chest tube with pleural drain on June 17, 2017 at the emergency room.  She has had a near spontaneous resolution.  Thoracic surgery was called for help with management of the pneumothorax but given lack of air bubbling they have recommended pulmonary manage and therefore pulmonary has been consulted.  This is a first episode.  She denies any trauma or fever or viral prodrome.  Overall she is doing well.  The main issue is that she is having pain because of the chest tube.  PAST MEDICAL HISTORY :  She  has a past medical history of Anxiety, COPD (chronic obstructive pulmonary disease) ([place]), Emphysema, and Substance abuse ([place]).  Results for Darlene, [name] (MRN [id number]) as of 06/18/2017 15:03  Ref. Range 08/02/2014 08:45  FEV1-Post Latest Units: L 0.97  FEV1-%Pred-Post Latest Units: % 39  FEV1-%Change-Post Latest Units: % 15  Post FEV1/FVC ratio Latest Units: % 38   Results for Darlene, [name] (MRN [id number]) as of 06/18/2017 15:03  Ref. Range 08/02/2014 08:45  DLCO unc Latest Units:  ml/min/mmHg 9.61  DLCO unc % pred Latest Units: % 41   PAST SURGICAL HISTORY: She  has a past surgical history that includes Back surgery and Knee surgery.  Allergies  Allergen Reactions  . Codeine Nausea And Vomiting    No current facility-administered medications on file prior to encounter.    Current Outpatient Medications on File Prior to Encounter  Medication Sig  . albuterol (PROAIR HFA) 108 (90 BASE) MCG/ACT inhaler Inhale 2 puffs into the lungs every 6 (six) hours as needed for wheezing or shortness of breath.  . ALPRAZolam (XANAX) 0.25 MG tablet Take 0.25 mg by mouth daily as needed for anxiety.   . budesonide-formoterol (SYMBICORT) 160-4.5 MCG/ACT inhaler Inhale 2 puffs into the lungs 2 (two) times daily.  . Cholecalciferol (VITAMIN D) 2000 units tablet Take 6,000 Units by mouth daily.  . DULoxetine (CYMBALTA) 60 MG capsule Take 60 mg by mouth daily.  . traMADol (ULTRAM) 50 MG tablet Take 50 mg by mouth every 6 (six) hours as needed. for pain  . Vitamin D, Ergocalciferol, (DRISDOL) 50000 units CAPS capsule Take 50,000 Units by mouth every 7 (seven) days.    FAMILY HISTORY:  Her indicated that her mother is alive. She indicated that her father is deceased. She indicated that her paternal aunt is alive.   SOCIAL HISTORY: She  reports that she has been smoking cigarettes.  She has a 63.00 pack-year smoking history. she has never used smokeless  tobacco. She reports that she drinks about 3.0 oz of alcohol per week. She reports that she does not use drugs.  VITAL SIGNS: BP 123/68   Pulse (!) 118   Temp 99.1 F (37.3 C) (Oral)   Resp (!) 22   Ht 5\' 3"  (1.6 m)   Wt 38.8 kg (85 lb 8.6 oz)   SpO2 97%   BMI 15.15 kg/m       INTAKE / OUTPUT: I/O last 3 completed shifts: In: 153 [I.V.:3; IV Piggyback:150] Out: 1200 [Urine:1200]     EXAM  General Appearance:   Cachectic female sitting in the chair and reading a newspaper without any distress  Head:     Normocephalic, without obvious abnormality, atraumatic  Eyes:    PERRL -yes, conjunctiva/corneas -clear      Ears:    Normal external ear canals, both ears  Nose:   NG tube -no but has nasal cannula oxygen  Throat:  ETT TUBE -no, OG tube -no  Neck:   Supple,  No enlargement/tenderness/nodules     Lungs:     Clear to auscultation bilaterally, distant air entry.  No wheeze.  Prolonged expiration.  Mild pursed lip breathing but speaks full sentences.  No accessory muscle use.  Air entry equal on both sides  Chest wall:    No deformity  Heart:    S1 and S2 normal, no murmur, CVP -no.  Pressors -no  Abdomen:     Soft, no masses, no organomegaly  Genitalia:    Not done  Rectal:   not done  Extremities:   Extremities-intact     Skin:   Intact in exposed areas . Sacral area -no decub per history     Neurologic:   Sedation -0-> RASS -+1. Moves all 4s -yes. CAM-ICU -negative for delirium. Orientation -x3+       LABS  PULMONARY No results for input(s): PHART, PCO2ART, PO2ART, HCO3, TCO2, O2SAT in the last 168 hours.  Invalid input(s): PCO2, PO2  CBC Recent Labs  Lab 06/17/17 0934 06/18/17 0311  HGB 14.3 12.1  HCT 44.2 38.1  WBC 14.6* 16.2*  PLT 328 277    COAGULATION No results for input(s): INR in the last 168 hours.  CARDIAC  No results for input(s): TROPONINI in the last 168 hours. No results for input(s): PROBNP in the last 168 hours.   CHEMISTRY Recent Labs  Lab 06/17/17 0934 06/18/17 0311  NA 136 137  K 4.2 3.4*  CL 96* 99*  CO2 28 31  GLUCOSE 166* 90  BUN 9 <5*  CREATININE 0.52 0.39*  CALCIUM 9.4 9.5   Estimated Creatinine Clearance: 44.7 mL/min (A) (by C-G formula based on SCr of 0.39 mg/dL (L)).   LIVER Recent Labs  Lab 06/18/17 0311  AST 16  ALT 11*  ALKPHOS 61  BILITOT 1.0  PROT 5.9*  ALBUMIN 2.8*     INFECTIOUS No results for input(s): LATICACIDVEN, PROCALCITON in the last 168 hours.   ENDOCRINE CBG (last 3)  No results for  input(s): GLUCAP in the last 72 hours.       IMAGING x48h  - image(s) personally visualized  -   highlighted in bold Dg Chest Port 1 View  Result Date: 06/18/2017 CLINICAL DATA:  Chest tube in place. EXAM: PORTABLE CHEST 1 VIEW COMPARISON:  06/17/2017 FINDINGS: The lungs are hyperinflated likely secondary to COPD. There is a left-sided chest tube with the tip at the apex. There is tiny pneumothorax along the left costophrenic  angle. There is no focal consolidation. There is no pleural effusion or pneumothorax. The heart and mediastinal contours are unremarkable. The osseous structures are unremarkable. IMPRESSION: 1. Left-sided chest tube with the tip at the apex with a tiny pneumothorax along the left costophrenic angle which has decreased in size compared with the prior exam. Electronically Signed   By: Kathreen Devoid   On: 06/18/2017 09:04   Dg Chest Portable 1 View  Result Date: 06/17/2017 CLINICAL DATA:  Chest tube in place EXAM: PORTABLE CHEST 1 VIEW COMPARISON:  06/17/2017 FINDINGS: Interval placement of left chest tube. The pigtail is partially formed at the left apex. Near complete re-expansion of the left lung with only a very small left lateral pneumothorax at the left base. Left base atelectasis. Heart is normal size. IMPRESSION: Interval placement of left chest tube with pigtail partially formed in the left upper chest. Near complete re-expansion of the left lung with only a small residual left lateral basilar pneumothorax. Electronically Signed   By: Rolm Baptise M.D.   On: 06/17/2017 11:33   Dg Chest Portable 1 View  Result Date: 06/17/2017 CLINICAL DATA:  Shortness of breath and low oxygen saturation, with decreased breath sounds on the left. EXAM: PORTABLE CHEST 1 VIEW COMPARISON:  Chest x-ray dated December 17, 2016. FINDINGS: The cardiomediastinal silhouette is normal in size. Normal pulmonary vascularity. New moderate left pneumothorax with slight mediastinal shift to the right.  Emphysematous changes in both lungs again noted. 10 mm nodule in the superior segment of the left lower lobe is grossly unchanged. No acute osseous abnormality. IMPRESSION: 1. New moderate left pneumothorax with slight mediastinal shift to the right. 2. COPD.  10 mm nodule in the left lower lobe is grossly unchanged. Critical Value/emergent results were called by telephone at the time of interpretation on 06/17/2017 at 10:23 am to Dr. Duffy Bruce , who verbally acknowledged these results. Electronically Signed   By: Titus Dubin M.D.   On: 06/17/2017 10:23       ASSESSMENT and PLAN  Spontaneous pneumothorax There is a second spontaneous pneumothorax due to her advanced COPD/emphysema.  Currently nearly resolved with status post small bore chest tube connected to suction.  She is only 24 hours post chest tube as of June 18, 2017  Plan -Continue chest tube with wall suction for the next 1 day and then without suction for another day for a total of 3 days of chest tube management before deciding next step [the slow procedure is because of advanced COPD and a high risk for lack of healing]   -In order to prevent a recurrence for which she is at high risk: she meets grade 1 recommendation indication for VATS pleurodesis/blebectomy and there is a question that we will ask thoracic surgery.  If this option is not possible then we should consider chemical pleurodesis  Cigarette smoker Encouraged to quit smoking  COPD GOLD III/ still smoking  Triple inhaler/bronchodilator therapy while in the hospital and oxygen for pulse ox greater than 88%.  COPD stable currently without any exacerbation.  Nodule of apex of right lung A cavitary 2 cm right upper lobe lung nodule was noticed in July 2018  Plan -We will get a CT scan of the chest without contrast while in the hospital      FAMILY  - Updates: 06/18/2017 --> patient  - Inter-disciplinary family meet or Palliative Care meeting due by:   DAy 7. Current LOS is LOS 1 days  CODE STATUS  Code Status Orders  (From admission, onward)        Start     Ordered   06/17/17 1244  Full code  Continuous     06/17/17 1245    Code Status History    Date Active Date Inactive Code Status Order ID Comments User Context   This patient has a current code status but no historical code status.    Advance Directive Documentation     Most Recent Value  Type of Advance Directive  Healthcare Power of Attorney  Pre-existing out of facility DNR order (yellow form or pink MOST form)  No data  "MOST" Form in Place?  No data        DISPO Keep in 6 E. 02.  Pulmonary will continue to follow      Dr. Brand Males, M.D., [facility].C.P Pulmonary and Critical Care Medicine Staff Physician, [place] Director - Interstitial Lung Disease  Program  P[facility] [place] [facility] [place], [place], [address]  Pager: [phone], If no answer or between  15:00h - 7:00h: call [phone] Telephone: [phone]

## 2017-06-19 ENCOUNTER — Inpatient Hospital Stay (HOSPITAL_COMMUNITY): Payer: BC Managed Care – PPO

## 2017-06-19 DIAGNOSIS — J939 Pneumothorax, unspecified: Secondary | ICD-10-CM

## 2017-06-19 DIAGNOSIS — J9312 Secondary spontaneous pneumothorax: Secondary | ICD-10-CM

## 2017-06-19 DIAGNOSIS — R0602 Shortness of breath: Secondary | ICD-10-CM

## 2017-06-19 LAB — CBC WITH DIFFERENTIAL/PLATELET
BASOS ABS: 0 10*3/uL (ref 0.0–0.1)
Basophils Relative: 0 %
EOS PCT: 2 %
Eosinophils Absolute: 0.3 10*3/uL (ref 0.0–0.7)
HCT: 35.5 % — ABNORMAL LOW (ref 36.0–46.0)
Hemoglobin: 10.9 g/dL — ABNORMAL LOW (ref 12.0–15.0)
LYMPHS PCT: 14 %
Lymphs Abs: 2 10*3/uL (ref 0.7–4.0)
MCH: 33.3 pg (ref 26.0–34.0)
MCHC: 30.7 g/dL (ref 30.0–36.0)
MCV: 108.6 fL — AB (ref 78.0–100.0)
Monocytes Absolute: 1.1 10*3/uL — ABNORMAL HIGH (ref 0.1–1.0)
Monocytes Relative: 8 %
Neutro Abs: 11.3 10*3/uL — ABNORMAL HIGH (ref 1.7–7.7)
Neutrophils Relative %: 76 %
PLATELETS: 256 10*3/uL (ref 150–400)
RBC: 3.27 MIL/uL — AB (ref 3.87–5.11)
RDW: 12 % (ref 11.5–15.5)
WBC: 14.8 10*3/uL — AB (ref 4.0–10.5)

## 2017-06-19 LAB — COMPREHENSIVE METABOLIC PANEL
ALT: 11 U/L — AB (ref 14–54)
AST: 15 U/L (ref 15–41)
Albumin: 2.6 g/dL — ABNORMAL LOW (ref 3.5–5.0)
Alkaline Phosphatase: 54 U/L (ref 38–126)
Anion gap: 6 (ref 5–15)
CHLORIDE: 95 mmol/L — AB (ref 101–111)
CO2: 29 mmol/L (ref 22–32)
CREATININE: 0.34 mg/dL — AB (ref 0.44–1.00)
Calcium: 8.6 mg/dL — ABNORMAL LOW (ref 8.9–10.3)
GFR calc Af Amer: >60 mL/min (ref >60)
GLUCOSE: 107 mg/dL — AB (ref 65–99)
Potassium: 4 mmol/L (ref 3.5–5.1)
Sodium: 130 mmol/L — ABNORMAL LOW (ref 135–145)
Total Bilirubin: 0.9 mg/dL (ref 0.3–1.2)
Total Protein: 5.8 g/dL — ABNORMAL LOW (ref 6.5–8.1)

## 2017-06-19 LAB — GLUCOSE, CAPILLARY: Glucose-Capillary: 113 mg/dL — ABNORMAL HIGH (ref 65–99)

## 2017-06-19 LAB — MAGNESIUM: MAGNESIUM: 1.6 mg/dL — AB (ref 1.7–2.4)

## 2017-06-19 LAB — PHOSPHORUS: Phosphorus: 2.4 mg/dL — ABNORMAL LOW (ref 2.5–4.6)

## 2017-06-19 MED ORDER — MAGNESIUM SULFATE 4 GM/100ML IV SOLN
4.0000 g | Freq: Once | INTRAVENOUS | Status: AC
  Administered 2017-06-19: 4 g via INTRAVENOUS
  Filled 2017-06-19: qty 100

## 2017-06-19 MED ORDER — SIMETHICONE 80 MG PO CHEW
80.0000 mg | CHEWABLE_TABLET | Freq: Four times a day (QID) | ORAL | Status: DC | PRN
  Administered 2017-06-19: 80 mg via ORAL
  Filled 2017-06-19: qty 1

## 2017-06-19 MED ORDER — TIOTROPIUM BROMIDE MONOHYDRATE 18 MCG IN CAPS
18.0000 ug | ORAL_CAPSULE | Freq: Every day | RESPIRATORY_TRACT | Status: DC
  Administered 2017-06-19 – 2017-06-21 (×3): 18 ug via RESPIRATORY_TRACT
  Filled 2017-06-19 (×2): qty 5

## 2017-06-19 MED ORDER — IPRATROPIUM-ALBUTEROL 0.5-2.5 (3) MG/3ML IN SOLN: 3.0000 mL | RESPIRATORY_TRACT | Status: DC | PRN

## 2017-06-19 MED ORDER — LEVOFLOXACIN 500 MG PO TABS
500.0000 mg | ORAL_TABLET | Freq: Every day | ORAL | Status: AC
  Administered 2017-06-20 – 2017-06-21 (×2): 500 mg via ORAL
  Filled 2017-06-19 (×2): qty 1

## 2017-06-19 NOTE — Progress Notes (Signed)
PHARMACY NOTE:  ANTIMICROBIAL RENAL DOSAGE ADJUSTMENT  Current antimicrobial regimen includes a mismatch between antimicrobial dosage and estimated renal function.  As per policy approved by the Pharmacy & Therapeutics and Medical Executive Committees, the antimicrobial dosage will be adjusted accordingly.  Current antimicrobial dosage:  750 mg IV Q24 hours  Indication: Pneumonia  Renal Function: ~45 ml/min   Estimated Creatinine Clearance: 44.7 mL/min (A) (by C-G formula based on SCr of 0.34 mg/dL (L)). []      On intermittent HD, scheduled: []      On CRRT    Antimicrobial dosage has been changed to:  500 mg po Q24 hours  Additional comments: IV switched to po since both formulations are bioequivalent.  Leroy Libman, PharmD  Pharmacy Resident Pager: [phone]

## 2017-06-19 NOTE — Progress Notes (Signed)
Patient transferred to negative pressure room (7M54), since QuantiFERON-TB was ordered and drawn.  Explained rationale for isolation to patient and family.

## 2017-06-19 NOTE — Progress Notes (Signed)
PROGRESS NOTE    Darlene [name]  OHY:[id number] DOB: Jul 02, 1955 DOA: 06/17/2017 PCP: [name], MD   Brief Narrative:  Darlene [name] is a 62 y.o. 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 She Presented to the ER today with reports of productive cough with new left-sided chest pain radiating to the lateral chest towards the back. This is also associated with shortness of breath.  Symptoms were of sudden onset today.  Room air pulse oximetry was documented as "low" in triage and she was subsequently placed on 3 L nasal cannula oxygen with pulse oximetry increasing to 95%.  X-ray performed in the ER revealed new moderate left pneumothorax with slight mediastinal shift to the right.  White count 14,600 differential not obtained.  Subsequently a chest tube has been placed by EDP with near complete reexpansion of the left lung with only a very small left lateral pneumothorax at the left base.  CVTS/Gerhardt evaluated and felt as if there was no air leak and recommended Pulmonary Evaluation. Pulmonary Dr. Chase Caller managing Chest Tube and ordered CT Chest.   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 Chase Caller consulted for further evaluation and recommendations and recommending continuing Chest Tube for 1 day with suction and then 1 day without for 3 days prior for evaluation; Per Dr. Chase Caller c/w Chest Tube but D/C Wall Suction and will need to reassess CXR -? Considering VATS Pleurodesis/Blebectomy vs Chemical Pleurodesis  -Repeat CXR this AM showed Left-sided chest tube with the tip at the apex with a tiny pneumothorax along the left costophrenic angle which has decreased in size compared with the prior exam. -Pulmonary obtaining CT of Chest; CT Chest showed increased size and wall thickness of RUL cavitary Lesion and patient had new small Cavitary lesions within the RLL and LLL. There was also stable partially calcified Nodule within the superior segment of the LLL. -Per Dr. Chase Caller will need to Call TCTS to discuss VATS Pleurodesis/Blebectomy due to High Re-occurrence.   COPD with emphysema  -New onset cough without fever or chills and no evidence of pneumonia on chest x-ray -Possible early viral syndrome so checked respiratory viral panel but Negative -With minor respiratory symptoms and requirement for chest tube we will utilize empiric Levaquin especially with presentation of leukocytosis and continue for now  -Blood cultures Show NGTD at 2 days, sputum culture although anticipate will be negative -Continue preadmission Symbicort (use therapeutic substitution Dulera) -Started Guaifenesin 1200 mg po BID -No indication for steroids at this juncture -Follows with [name] in the outpatient setting with last evaluation August 2018 -Pulmonary Consulted and recommending Triple Inhaler/Bronchiodilator Therapy -COPD showed Emphysema  and lesions mentioned as above.  -Pulmonary Also adding Spiriva   Tobacco abuse -Admits to smoking  1-1/2 pack cigarettes per day -Nicotine patch -Smoking Cessation Counseling given   Severe protein-calorie malnutrition/ Pulmonary cachexia due to COPD  -Patient reports 20 pound weight loss over the past 12 months; ? Related to 2 cm RUL Lung Nodule -Continue preadmission protein beverages -Nutrition consultation -Recommending Ensure Enlive po BID and Magic Cup BID -With new Cavitary Lesion it is concerning so will r/o TB  Anxiety and Depression  -Continue preadmission Alprazolam 0.24 mg po q8hprn -Continue Cymbalta if still taking prior to admission  Osteoporosis -Apparent new diagnosis -Has been prescribed Fosamax but has not taken yet -Discussed necessity to begin this medication and determine if can tolerate noting multiple risk factors for worsening osteoporosis: Postmenopausal, history of frequent steroid pulse tapers, protein calorie malnutrition; also discuss unwanted sequelae such as spontaneous spinal compression fractures, wrist or hip fractures with falls, spontaneous rib fractures and patient w/ COPD and coughing -C/w Vitamin D 50,000 units po q7day -Patient aware other drugs are available if unable to tolerate Fosamax  Right Lung Apical Nodule -Seen previously in June -Pulmonary obtaining CT of Chest w/o Contrast and showed increased size and wall thickness and had new small cavitary lesions in RLL and LLL  -After discussion with Pulmonary patient does not want to have it Biopsied -Pulmonary r/o other etiologies of Cavitary lesions so they have ordered ANA, ANCA, GBM and Quantiferion-Gold -Patient moved to Negative Pressure room.   Hypokalemia  -Patient's K+ was 3.4 and improved to 4.0 -Continue to Monitor and Replete as Necessary -Repeat CMP in AM   Leukocytosis -Improving  -?Reactive -C/w Abx but changed to po Levofloxacin  -Blood Cx x2 show NGTD at 2 days -Continue to Monitor for S/Sx of Infection -Repeat CXR in AM and Obtaining CT of Chest w/o  Contrast  Sinus Tachycardia, improving  -? From Respiratory Issues vs. Pain -Given IV bolus 1 Liter; Started Maintenance IVF at NS 75 mL/hr and will continue  -Continue to Monitor with Telemetry   DVT prophylaxis: SCDs Code Status: FULL CODE Family Communication: No family present at bedside Disposition Plan: Remain Inpatient   Consultants:   Cardiothoracic Surgery  Pulmonary   Procedures:  Left Chest Tube    Antimicrobials:  Anti-infectives (From admission, onward)   Start     Dose/Rate Route Frequency Ordered Stop   06/20/17 1200  levofloxacin (LEVAQUIN) tablet 500 mg     500 mg Oral Daily 06/19/17 1214 06/22/17 1159   06/17/17 1245  levofloxacin (LEVAQUIN) IVPB 750 mg  Status:  Discontinued     750 mg 100 mL/hr over 90 Minutes Intravenous Every 24 hours 06/17/17 1242 06/19/17 1214     Subjective: Seen and examined and was feeling better. Was able to be weaned off of O2. Had some pain. Discussed about lesions in chest and does not want to pursue further work up.   Objective: Vitals:   06/19/17 0439 06/19/17 0807 06/19/17 1400 06/19/17 1946  BP: (!) 120/59  100/68 (!) 119/52  Pulse: (!) 118  (!) 123 (!) 109  Resp:   (!) 22 20  Temp: 99.3 F (37.4 C)  98.1 F (36.7 C) 98.3 F (36.8 C)  TempSrc: Oral   Oral  SpO2: 96% 94% 90% 97%  Weight:      Height:        Intake/Output Summary (Last 24 hours) at 06/19/2017 2008 Last data filed at 06/19/2017 1854 Gross per 24  hour  Intake 1486.25 ml  Output 1300 ml  Net 186.25 ml   Filed Weights   06/17/17 2002 06/18/17 0541  Weight: 38.9 kg (85 lb 11.2 oz) 38.8 kg (85 lb 8.6 oz)   Examination: Physical Exam:  Constitutional: Thin cachectic appearing Caucasian female in NAD appears calm  Eyes: Sclerae anicteric. Lids normal ENMT: External Ears and nose appear normal. MMM Neck: Supple with no JVD Respiratory: Diminished to auscultation but no appreciable wheezing/rales/rhonchi; Normal respiratory  effort Cardiovascular: Slightly tachycardic; No m/r/g. Left Chest Tube in Place Abdomen: Soft, NT, ND. Bowel sounds present GU: Deferred Musculoskeletal: No contractures; No cyanosis Skin: Warm and Dry Neurologic: CN 2-12 grossly intact. No appreciable focal deficits Psychiatric: Normal mood and affect. Intact judgement and insight  Data Reviewed: I have personally reviewed following labs and imaging studies  CBC: Recent Labs  Lab 06/17/17 0934 06/18/17 0311 06/19/17 0311  WBC 14.6* 16.2* 14.8*  NEUTROABS  --   --  11.3*  HGB 14.3 12.1 10.9*  HCT 44.2 38.1 35.5*  MCV 107.0* 107.3* 108.6*  PLT 328 277 371   Basic Metabolic Panel: Recent Labs  Lab 06/17/17 0934 06/18/17 0311 06/19/17 0311  NA 136 137 130*  K 4.2 3.4* 4.0  CL 96* 99* 95*  CO2 28 31 29   GLUCOSE 166* 90 107*  BUN 9 <5* <5*  CREATININE 0.52 0.39* 0.34*  CALCIUM 9.4 9.5 8.6*  MG  --   --  1.6*  PHOS  --   --  2.4*   GFR: Estimated Creatinine Clearance: 44.7 mL/min (A) (by C-G formula based on SCr of 0.34 mg/dL (L)). Liver Function Tests: Recent Labs  Lab 06/18/17 0311 06/19/17 0311  AST 16 15  ALT 11* 11*  ALKPHOS 61 54  BILITOT 1.0 0.9  PROT 5.9* 5.8*  ALBUMIN 2.8* 2.6*   No results for input(s): LIPASE, AMYLASE in the last 168 hours. No results for input(s): AMMONIA in the last 168 hours. Coagulation Profile: No results for input(s): INR, PROTIME in the last 168 hours. Cardiac Enzymes: No results for input(s): CKTOTAL, CKMB, CKMBINDEX, TROPONINI in the last 168 hours. BNP (last 3 results) No results for input(s): PROBNP in the last 8760 hours. HbA1C: No results for input(s): HGBA1C in the last 72 hours. CBG: No results for input(s): GLUCAP in the last 168 hours. Lipid Profile: No results for input(s): CHOL, HDL, LDLCALC, TRIG, CHOLHDL, LDLDIRECT in the last 72 hours. Thyroid Function Tests: No results for input(s): TSH, T4TOTAL, FREET4, T3FREE, THYROIDAB in the last 72 hours. Anemia  Panel: No results for input(s): VITAMINB12, FOLATE, FERRITIN, TIBC, IRON, RETICCTPCT in the last 72 hours. Sepsis Labs: No results for input(s): PROCALCITON, LATICACIDVEN in the last 168 hours.  Recent Results (from the past 240 hour(s))  Culture, blood (routine x 2) Call MD if unable to obtain prior to antibiotics being given     Status: None (Preliminary result)   Collection Time: 06/17/17  1:00 PM  Result Value Ref Range Status   Specimen Description BLOOD LEFT ANTECUBITAL  Final   Special Requests   Final    BOTTLES DRAWN AEROBIC AND ANAEROBIC Blood Culture adequate volume   Culture NO GROWTH 2 DAYS  Final   Report Status PENDING  Incomplete  Culture, blood (routine x 2) Call MD if unable to obtain prior to antibiotics being given     Status: None (Preliminary result)   Collection Time: 06/17/17  1:15 PM  Result Value Ref Range Status   Specimen  Description BLOOD LEFT ARM  Final   Special Requests   Final    BOTTLES DRAWN AEROBIC AND ANAEROBIC Blood Culture adequate volume   Culture NO GROWTH 2 DAYS  Final   Report Status PENDING  Incomplete  Respiratory Panel by PCR     Status: None   Collection Time: 06/18/17  2:50 AM  Result Value Ref Range Status   Adenovirus NOT DETECTED NOT DETECTED Final   Coronavirus 229E NOT DETECTED NOT DETECTED Final   Coronavirus HKU1 NOT DETECTED NOT DETECTED Final   Coronavirus NL63 NOT DETECTED NOT DETECTED Final   Coronavirus OC43 NOT DETECTED NOT DETECTED Final   Metapneumovirus NOT DETECTED NOT DETECTED Final   Rhinovirus / Enterovirus NOT DETECTED NOT DETECTED Final   Influenza A NOT DETECTED NOT DETECTED Final   Influenza B NOT DETECTED NOT DETECTED Final   Parainfluenza Virus 1 NOT DETECTED NOT DETECTED Final   Parainfluenza Virus 2 NOT DETECTED NOT DETECTED Final   Parainfluenza Virus 3 NOT DETECTED NOT DETECTED Final   Parainfluenza Virus 4 NOT DETECTED NOT DETECTED Final   Respiratory Syncytial Virus NOT DETECTED NOT DETECTED Final    Bordetella pertussis NOT DETECTED NOT DETECTED Final   Chlamydophila pneumoniae NOT DETECTED NOT DETECTED Final   Mycoplasma pneumoniae NOT DETECTED NOT DETECTED Final    Radiology Studies: Ct Chest Wo Contrast  Result Date: 06/18/2017 CLINICAL DATA:  COPD.  Lung nodule greater than or equal to 1 cm. EXAM: CT CHEST WITHOUT CONTRAST TECHNIQUE: Multidetector CT imaging of the chest was performed following the standard protocol without IV contrast. COMPARISON:  CT of the chest on 12/31/2016 FINDINGS: Cardiovascular: There is atherosclerotic calcification of the coronary arteries. No pericardial effusion. There is atherosclerotic calcification of the thoracic aorta not associated with aneurysm. Mediastinum/Nodes: No mediastinal, hilar, or axillary adenopathy. The visualized portion of the thyroid gland has a normal appearance. Lungs/Pleura: Marked emphysematous changes are present. Cavitary lesion within the right upper lobe measures 2.5 x 1.4 cm, previously 2.0 x 0.9 cm. There is increased pleural thickening adjacent to the cavitary lesion. No associated osseous involvement. Stable appearance of a second small right upper lobe nodule measuring 6 mm on image 76 of series 8. There is stable appearance of a partially calcified left upper lobe nodule measuring 9 x 7 mm on image 73 of series 8. Two new opacities are identified in the medial lower lobes. In the medial right lower lobe, a small nodule with early cavitation is 8 x 10 mm on image 107 of series 8. The medial left lower lobe, a small nodule with early cavitation is 7 x 8 mm on image 113 of series 8. There are reticulonodular changes in the lung bases bilaterally. No focal consolidations or pleural effusions. No evidence for pulmonary edema. Upper Abdomen: No acute abnormality. Musculoskeletal: Stable wedge compression fractures at numerous thoracic levels. No acute fractures. No lytic or blastic lesions. IMPRESSION: 1. Increased size and wall thickness of  right upper lobe cavitary lesion. 2. New small cavitary lesions within the right lower lobe and left lower lobe. 3. Stable partially calcified nodule within the superior segment of the left lower lobe 4.  Consider follow-up PET-CT or tissue sampling. 5.  Aortic atherosclerosis.  (ICD10-I70.0) 6. Coronary artery disease. 7.  Emphysema (ICD10-J43.9) Electronically Signed   By: Nolon Nations M.D.   On: 06/18/2017 23:28   Dg Chest Port 1 View  Result Date: 06/18/2017 CLINICAL DATA:  Chest tube in place. EXAM: PORTABLE CHEST 1 VIEW  COMPARISON:  06/17/2017 FINDINGS: The lungs are hyperinflated likely secondary to COPD. There is a left-sided chest tube with the tip at the apex. There is tiny pneumothorax along the left costophrenic angle. There is no focal consolidation. There is no pleural effusion or pneumothorax. The heart and mediastinal contours are unremarkable. The osseous structures are unremarkable. IMPRESSION: 1. Left-sided chest tube with the tip at the apex with a tiny pneumothorax along the left costophrenic angle which has decreased in size compared with the prior exam. Electronically Signed   By: Kathreen Devoid   On: 06/18/2017 09:04   Scheduled Meds: . DULoxetine  30 mg Oral Daily  . enoxaparin (LOVENOX) injection  30 mg Subcutaneous Q24H  . feeding supplement (ENSURE ENLIVE)  237 mL Oral BID BM   Or  . feeding supplement  1 Container Oral BID BM  . guaiFENesin  1,200 mg Oral BID  . [START ON 06/20/2017] levofloxacin  500 mg Oral Daily  . mometasone-formoterol  2 puff Inhalation BID  . nicotine  21 mg Transdermal Daily  . pneumococcal 23 valent vaccine  0.5 mL Intramuscular Tomorrow-1000  . sodium chloride flush  3 mL Intravenous Q12H  . tiotropium  18 mcg Inhalation Daily  . Vitamin D (Ergocalciferol)  50,000 Units Oral Q7 days   Continuous Infusions: . sodium chloride 75 mL/hr (06/18/17 2147)    LOS: 2 days   Kerney Elbe, DO [facility] Pager [phone]  If  7PM-7AM, please contact night-coverage [email or web address] Password [email or web address] 06/19/2017, 8:08 PM

## 2017-06-19 NOTE — Consult Note (Signed)
PULMONARY / CRITICAL CARE MEDICINE   Name: Darlene [name] MRN: [id number] DOB: February 28, 1956 PCP [name], MD LOS 2 as of 06/19/2017     ADMISSION DATE:  06/17/2017 CONSULTATION DATE:  06/18/2017   REFERRING MD:  Triad MD  CHIEF COMPLAINT:  Left spontaneous pneumothorax secondary to COPD  brief 62 year old female with advanced COPD followed by Dr. Christinia Gully, February 2016 pulmonary function test shows Gold stage III COPD (fev1 in feb 2016L 0.97L/39%, DLCO 40%) with low DLCO.  She is also known to have a 2 cm right upper lobe lung nodule that is cavitary based on July 2018 CT scan of the chest.  She is not on oxygen at home but on triple inhaler therapy.  She is a 63 pack smoking history and continues to smoke.  She presented on June 17, 2017 with sudden onset of chest pain and noticed to have a greater than 20% left-sided secondary spontaneous pneumothorax.  She is status post small bore chest tube with pleural drain on June 17, 2017 at the emergency room.  She has had a near spontaneous resolution.  Thoracic surgery was called for help with management of the pneumothorax but given lack of air bubbling they have recommended pulmonary manage and therefore pulmonary has been consulted.  This is a first episode.  She denies any trauma or fever or viral prodrome.  Overall she is doing well.  The main issue is that she is having pain because of the chest tube.  EVENTS 06/17/2017 - left chest tube 06/18/17 - pccm consult.    SUBJECTIVE/OVERNIGHT/INTERVAL HX 06/19/17 =- sstill some pain. HR better. Wants to ambulate. REfuses nodule workup. No adverse events overnight. CT chest with resolution (near total) of left ptx with chest tube in good position. Also, RUL cavity - worse since July 2018 and has LLL cavity. Refuses workup for this   VITAL SIGNS: BP (!) 120/59 (BP Location: Left Arm)   Pulse (!) 118   Temp 99.3 F (37.4 C) (Oral)   Resp 13   Ht 5\' 3"  (1.6 m)   Wt 38.8 kg (85 lb 8.6 oz)    SpO2 94%   BMI 15.15 kg/m       INTAKE / OUTPUT: I/O last 3 completed shifts: In: 1639.3 [P.O.:720; I.V.:619.3; IV Piggyback:300] Out: 2110 [Urine:2100; Chest Tube:10]     EXAM  General Appearance:    Looks cachectic  Head:    Normocephalic, without obvious abnormality, atraumatic  Eyes:    PERRL - yes, conjunctiva/corneas - clea5      Ears:    Normal external ear canals, both ears  Nose:   NG tube - no  Throat:  ETT TUBE - no , OG tube - no  Neck:   Supple,  No enlargement/tenderness/nodules     Lungs:     Clear to auscultation bilaterally,  Chest wall:    No deformity  Heart:    S1 and S2 normal, no murmur, CVP - no.  Pressors - no  Abdomen:     Soft, no masses, no organomegaly  Genitalia:    Not done  Rectal:   not done  Extremities:   Extremities- intact     Skin:   Intact in exposed areas . Sacral area - no decub     Neurologic:   Sedation - none -> RASS - +1 . Moves all 4s - yes. CAM-ICU - neg . Orientation - x3+    LABS  PULMONARY No results for input(s): PHART,  PCO2ART, PO2ART, HCO3, TCO2, O2SAT in the last 168 hours.  Invalid input(s): PCO2, PO2  CBC Recent Labs  Lab 06/17/17 0934 06/18/17 0311 06/19/17 0311  HGB 14.3 12.1 10.9*  HCT 44.2 38.1 35.5*  WBC 14.6* 16.2* 14.8*  PLT 328 277 256    COAGULATION No results for input(s): INR in the last 168 hours.  CARDIAC  No results for input(s): TROPONINI in the last 168 hours. No results for input(s): PROBNP in the last 168 hours.   CHEMISTRY Recent Labs  Lab 06/17/17 0934 06/18/17 0311 06/19/17 0311  NA 136 137 130*  K 4.2 3.4* 4.0  CL 96* 99* 95*  CO2 28 31 29   GLUCOSE 166* 90 107*  BUN 9 <5* <5*  CREATININE 0.52 0.39* 0.34*  CALCIUM 9.4 9.5 8.6*  MG  --   --  1.6*  PHOS  --   --  2.4*   Estimated Creatinine Clearance: 44.7 mL/min (A) (by C-G formula based on SCr of 0.34 mg/dL (L)).   LIVER Recent Labs  Lab 06/18/17 0311 06/19/17 0311  AST 16 15  ALT 11* 11*  ALKPHOS  61 54  BILITOT 1.0 0.9  PROT 5.9* 5.8*  ALBUMIN 2.8* 2.6*     INFECTIOUS No results for input(s): LATICACIDVEN, PROCALCITON in the last 168 hours.   ENDOCRINE CBG (last 3)  No results for input(s): GLUCAP in the last 72 hours.       IMAGING x48h  - image(s) personally visualized  -   highlighted in bold Ct Chest Wo Contrast  Result Date: 06/18/2017 CLINICAL DATA:  COPD.  Lung nodule greater than or equal to 1 cm. EXAM: CT CHEST WITHOUT CONTRAST TECHNIQUE: Multidetector CT imaging of the chest was performed following the standard protocol without IV contrast. COMPARISON:  CT of the chest on 12/31/2016 FINDINGS: Cardiovascular: There is atherosclerotic calcification of the coronary arteries. No pericardial effusion. There is atherosclerotic calcification of the thoracic aorta not associated with aneurysm. Mediastinum/Nodes: No mediastinal, hilar, or axillary adenopathy. The visualized portion of the thyroid gland has a normal appearance. Lungs/Pleura: Marked emphysematous changes are present. Cavitary lesion within the right upper lobe measures 2.5 x 1.4 cm, previously 2.0 x 0.9 cm. There is increased pleural thickening adjacent to the cavitary lesion. No associated osseous involvement. Stable appearance of a second small right upper lobe nodule measuring 6 mm on image 76 of series 8. There is stable appearance of a partially calcified left upper lobe nodule measuring 9 x 7 mm on image 73 of series 8. Two new opacities are identified in the medial lower lobes. In the medial right lower lobe, a small nodule with early cavitation is 8 x 10 mm on image 107 of series 8. The medial left lower lobe, a small nodule with early cavitation is 7 x 8 mm on image 113 of series 8. There are reticulonodular changes in the lung bases bilaterally. No focal consolidations or pleural effusions. No evidence for pulmonary edema. Upper Abdomen: No acute abnormality. Musculoskeletal: Stable wedge compression fractures  at numerous thoracic levels. No acute fractures. No lytic or blastic lesions. IMPRESSION: 1. Increased size and wall thickness of right upper lobe cavitary lesion. 2. New small cavitary lesions within the right lower lobe and left lower lobe. 3. Stable partially calcified nodule within the superior segment of the left lower lobe 4.  Consider follow-up PET-CT or tissue sampling. 5.  Aortic atherosclerosis.  (ICD10-I70.0) 6. Coronary artery disease. 7.  Emphysema (ICD10-J43.9) Electronically Signed  By: Nolon Nations M.D.   On: 06/18/2017 23:28   Dg Chest Port 1 View  Result Date: 06/18/2017 CLINICAL DATA:  Chest tube in place. EXAM: PORTABLE CHEST 1 VIEW COMPARISON:  06/17/2017 FINDINGS: The lungs are hyperinflated likely secondary to COPD. There is a left-sided chest tube with the tip at the apex. There is tiny pneumothorax along the left costophrenic angle. There is no focal consolidation. There is no pleural effusion or pneumothorax. The heart and mediastinal contours are unremarkable. The osseous structures are unremarkable. IMPRESSION: 1. Left-sided chest tube with the tip at the apex with a tiny pneumothorax along the left costophrenic angle which has decreased in size compared with the prior exam. Electronically Signed   By: Kathreen Devoid   On: 06/18/2017 09:04   Dg Chest Portable 1 View  Result Date: 06/17/2017 CLINICAL DATA:  Chest tube in place EXAM: PORTABLE CHEST 1 VIEW COMPARISON:  06/17/2017 FINDINGS: Interval placement of left chest tube. The pigtail is partially formed at the left apex. Near complete re-expansion of the left lung with only a very small left lateral pneumothorax at the left base. Left base atelectasis. Heart is normal size. IMPRESSION: Interval placement of left chest tube with pigtail partially formed in the left upper chest. Near complete re-expansion of the left lung with only a small residual left lateral basilar pneumothorax. Electronically Signed   By: Rolm Baptise M.D.    On: 06/17/2017 11:33   Dg Chest Portable 1 View  Result Date: 06/17/2017 CLINICAL DATA:  Shortness of breath and low oxygen saturation, with decreased breath sounds on the left. EXAM: PORTABLE CHEST 1 VIEW COMPARISON:  Chest x-ray dated December 17, 2016. FINDINGS: The cardiomediastinal silhouette is normal in size. Normal pulmonary vascularity. New moderate left pneumothorax with slight mediastinal shift to the right. Emphysematous changes in both lungs again noted. 10 mm nodule in the superior segment of the left lower lobe is grossly unchanged. No acute osseous abnormality. IMPRESSION: 1. New moderate left pneumothorax with slight mediastinal shift to the right. 2. COPD.  10 mm nodule in the left lower lobe is grossly unchanged. Critical Value/emergent results were called by telephone at the time of interpretation on 06/17/2017 at 10:23 am to Dr. Duffy Bruce , who verbally acknowledged these results. Electronically Signed   By: Titus Dubin M.D.   On: 06/17/2017 10:23       ASSESSMENT and PLAN  Spontaneous pneumothorax There is a secondary  spontaneous pneumothorax due to her advanced COPD/emphysema.  S/p chest tube 06/17/2017 on as of 06/19/17 on 48h chest tube with suction. CT chest 06/18/17 with near total resolutions    Plan -Continue chest tube but dc wall suction  - REasess CXR off walk suction 06/20/17 ( total of 3 days of chest tube management before deciding next step because of advanced COPD and a high risk for lack of healing] - Call CVTS 06/20/17 to discuss VATS pleurodesis/blebectomy (meets grade 1 recommendation indication for VATS pleurodesis/blebectomy v chest tube based pleurodesis) due to high risk of recurrence.   I d/w with Leda Gauze   Cigarette smoker Encouraged to quit smoking  COPD GOLD III/ still smoking  Triple inhaler/bronchodilator therapy while in the hospital and oxygen for pulse ox greater than 88%.  COPD stable currently without any exacerbation. Will  add spiriva 06/19/2017. Continue dulera in lieu of home symbicort  Nodule of apex of right lung A cavitary 2 cm right upper lobe lung nodule was noticed in July 2018  Worse on CT 06/18/17 and also new cavity smaller LLL    Plan -d/w patient - she does NOT want bx for this; understands if this is lung cancer she wants natural course oflung cancer to consume her. Prefers quality of life  - will do ANA, ANCA, GBM and Quant Gold to elucidate alternate etiologies      FAMILY  - Updates: 06/19/2017 --> patient  - Inter-disciplinary family meet or Palliative Care meeting due by:  DAy 7. Current LOS is LOS 2 days  CODE STATUS    Code Status Orders  (From admission, onward)        Start     Ordered   06/17/17 1244  Full code  Continuous     06/17/17 1245    Code Status History    Date Active Date Inactive Code Status Order ID Comments User Context   This patient has a current code status but no historical code status.    Advance Directive Documentation     Most Recent Value  Type of Advance Directive  Healthcare Power of Attorney  Pre-existing out of facility DNR order (yellow form or pink MOST form)  No data  "MOST" Form in Place?  No data        DISPO Keep in 6 E. 02.  Pulmonary will continue to follow      Dr. Brand Males, M.D., [facility].C.P Pulmonary and Critical Care Medicine Staff Physician, [place] Director - Interstitial Lung Disease  Program  Pulmonary [place] at [place], [place], [address]  Pager: [phone], If no answer or between  15:00h - 7:00h: call [phone] Telephone: [phone]

## 2017-06-19 NOTE — Progress Notes (Signed)
Patient to x-ray via bed.  Patient wearing mask in hallway, due to Airborne Precautions.

## 2017-06-20 ENCOUNTER — Inpatient Hospital Stay (HOSPITAL_COMMUNITY): Payer: BC Managed Care – PPO

## 2017-06-20 LAB — CBC WITH DIFFERENTIAL/PLATELET
BASOS ABS: 0 10*3/uL (ref 0.0–0.1)
BASOS PCT: 0 %
Eosinophils Absolute: 0.3 10*3/uL (ref 0.0–0.7)
Eosinophils Relative: 4 %
HEMATOCRIT: 35.8 % — AB (ref 36.0–46.0)
HEMOGLOBIN: 11.3 g/dL — AB (ref 12.0–15.0)
Lymphocytes Relative: 15 %
Lymphs Abs: 1.3 10*3/uL (ref 0.7–4.0)
MCH: 33.5 pg (ref 26.0–34.0)
MCHC: 31.6 g/dL (ref 30.0–36.0)
MCV: 106.2 fL — ABNORMAL HIGH (ref 78.0–100.0)
MONO ABS: 0.8 10*3/uL (ref 0.1–1.0)
Monocytes Relative: 9 %
NEUTROS ABS: 6.4 10*3/uL (ref 1.7–7.7)
NEUTROS PCT: 72 %
Platelets: 254 10*3/uL (ref 150–400)
RBC: 3.37 MIL/uL — ABNORMAL LOW (ref 3.87–5.11)
RDW: 11.8 % (ref 11.5–15.5)
WBC: 8.9 10*3/uL (ref 4.0–10.5)

## 2017-06-20 LAB — COMPREHENSIVE METABOLIC PANEL
ALBUMIN: 2.5 g/dL — AB (ref 3.5–5.0)
ALT: 11 U/L — ABNORMAL LOW (ref 14–54)
AST: 15 U/L (ref 15–41)
Alkaline Phosphatase: 52 U/L (ref 38–126)
Anion gap: 6 (ref 5–15)
BILIRUBIN TOTAL: 0.8 mg/dL (ref 0.3–1.2)
CHLORIDE: 102 mmol/L (ref 101–111)
CO2: 28 mmol/L (ref 22–32)
Calcium: 8.4 mg/dL — ABNORMAL LOW (ref 8.9–10.3)
Creatinine, Ser: 0.36 mg/dL — ABNORMAL LOW (ref 0.44–1.00)
GFR calc Af Amer: >60 mL/min (ref >60)
GFR calc non Af Amer: >60 mL/min (ref >60)
GLUCOSE: 108 mg/dL — AB (ref 65–99)
POTASSIUM: 3.8 mmol/L (ref 3.5–5.1)
Sodium: 136 mmol/L (ref 135–145)
TOTAL PROTEIN: 5.4 g/dL — AB (ref 6.5–8.1)

## 2017-06-20 LAB — GLUCOSE, CAPILLARY: Glucose-Capillary: 113 mg/dL — ABNORMAL HIGH (ref 65–99)

## 2017-06-20 LAB — GLOMERULAR BASEMENT MEMBRANE ANTIBODIES: GBM AB: 3 U (ref 0–20)

## 2017-06-20 LAB — PHOSPHORUS: Phosphorus: 3.2 mg/dL (ref 2.5–4.6)

## 2017-06-20 LAB — MPO/PR-3 (ANCA) ANTIBODIES
ANCA Proteinase 3: 4.7 U/mL — ABNORMAL HIGH (ref 0.0–3.5)
Myeloperoxidase Abs: <9 U/mL (ref 0.0–9.0)

## 2017-06-20 LAB — ANTINUCLEAR ANTIBODIES, IFA: ANTINUCLEAR ANTIBODIES, IFA: NEGATIVE

## 2017-06-20 LAB — MAGNESIUM: Magnesium: 1.9 mg/dL (ref 1.7–2.4)

## 2017-06-20 NOTE — Progress Notes (Signed)
Patient c/o increased pain above the chest tube insertion site.  Site without crepitus.  Patient denies increased SOB, remains on room air.  Notified Dr. Alfredia Ferguson.

## 2017-06-20 NOTE — Progress Notes (Signed)
PULMONARY / CRITICAL CARE MEDICINE   Name: Darlene [name] MRN: [id number] DOB: [birth year] PCP [name], MD LOS 3 as of 06/20/2017     ADMISSION DATE:  06/17/2017 CONSULTATION DATE:  06/18/2017   REFERRING MD:  Triad MD  CHIEF COMPLAINT:  Left spontaneous pneumothorax secondary to COPD  brief 62 year old female with advanced COPD followed by Dr. Christinia Gully, February 2016 pulmonary function test shows Gold stage III COPD (fev1 in feb 2016L 0.97L/39%, DLCO 40%) with low DLCO.  She is also known to have a 2 cm right upper lobe lung nodule that is cavitary based on July 2018 CT scan of the chest.  She is not on oxygen at home but on triple inhaler therapy.  She is a 63 pack smoking history and continues to smoke.  She presented on June 17, 2017 with sudden onset of chest pain and noticed to have a greater than 20% left-sided secondary spontaneous pneumothorax.  She is status post small bore chest tube with pleural drain on June 17, 2017 at the emergency room.  She has had a near spontaneous resolution.  Thoracic surgery was called for help with management of the pneumothorax but given lack of air bubbling they have recommended pulmonary manage and therefore pulmonary has been consulted.  This is a first episode.  She denies any trauma or fever or viral prodrome.  Overall she is doing well.  The main issue is that she is having pain because of the chest tube.  EVENTS 06/17/2017 - left chest tube 06/18/17 - pccm consult.    SUBJECTIVE/OVERNIGHT/INTERVAL HX Doing well, does report new left-sided chest discomfort primarily under her left shoulder blade this is new for about the last couple hours  VITAL SIGNS: BP 117/68   Pulse (!) 114   Temp 98.6 F (37 C) (Oral)   Resp (!) 22   Ht 5\' 3"  (1.6 m)   Wt 88 lb 6.4 oz (40.1 kg)   SpO2 95%   BMI 15.66 kg/m  Room air    INTAKE / OUTPUT: I/O last 3 completed shifts: In: 1486.3 [P.O.:720; I.V.:616.3; IV Piggyback:150] Out: 1300  [Urine:1300]   EXAM General: This is a frail 62 year old white female resting comfortably in bed no acute distress HEENT: Normocephalic atraumatic no jugular venous distention mucous membranes are moist Pulmonary: Clear to auscultation equal chest rise.  The left lateral/anterior chest tube dressing is intact.  There is no air leak noted, however also no fluctuation noted in chest tube Cardiac: Regular rate and rhythm Abdomen: Soft nontender no organomegaly Extremities/musculoskeletal: Equal strength and bulk Neuro: Awake oriented no focal deficits  LABS  PULMONARY No results for input(s): PHART, PCO2ART, PO2ART, HCO3, TCO2, O2SAT in the last 168 hours.  Invalid input(s): PCO2, PO2  CBC Recent Labs  Lab 06/18/17 0311 06/19/17 0311 06/20/17 0435  HGB 12.1 10.9* 11.3*  HCT 38.1 35.5* 35.8*  WBC 16.2* 14.8* 8.9  PLT 277 256 254    COAGULATION No results for input(s): INR in the last 168 hours.  CARDIAC  No results for input(s): TROPONINI in the last 168 hours. No results for input(s): PROBNP in the last 168 hours.   CHEMISTRY Recent Labs  Lab 06/17/17 0934 06/18/17 0311 06/19/17 0311 06/20/17 0435  NA 136 137 130* 136  K 4.2 3.4* 4.0 3.8  CL 96* 99* 95* 102  CO2 28 31 29 28   GLUCOSE 166* 90 107* 108*  BUN 9 <5* <5* <5*  CREATININE 0.52 0.39* 0.34* 0.36*  CALCIUM  9.4 9.5 8.6* 8.4*  MG  --   --  1.6* 1.9  PHOS  --   --  2.4* 3.2   Estimated Creatinine Clearance: 46.2 mL/min (A) (by C-G formula based on SCr of 0.36 mg/dL (L)).   LIVER Recent Labs  Lab 06/18/17 0311 06/19/17 0311 06/20/17 0435  AST 16 15 15   ALT 11* 11* 11*  ALKPHOS 61 54 52  BILITOT 1.0 0.9 0.8  PROT 5.9* 5.8* 5.4*  ALBUMIN 2.8* 2.6* 2.5*     INFECTIOUS No results for input(s): LATICACIDVEN, PROCALCITON in the last 168 hours.   ENDOCRINE CBG (last 3)  Recent Labs    06/19/17 2142 06/20/17 0811  GLUCAP 113* 113*         IMAGING x48h  - image(s) personally  visualized  -   highlighted in bold Dg Chest 2 View  Result Date: 06/20/2017 CLINICAL DATA:  Dyspnea with chest tube EXAM: CHEST  2 VIEW COMPARISON:  Chest CT 06/18/2017 FINDINGS: Heart size is within normal limits. Aortic atherosclerosis without aneurysmal dilatation is seen at the arch. Emphysematous hyperinflation of the lungs. Left-sided chest tube is seen draped over the medial aspect of the left upper lobe. Streaky parenchymal opacities are seen in the right upper lobe. The cavitation noted within the right upper lobe is not well visualized radiographically. Pleural thickening is seen bilaterally right greater than left. No overt pulmonary edema. No effusion or appreciable pneumothorax. No aggressive osseous abnormality. IMPRESSION: 1. COPD. 2. Left apical chest tube in place, unchanged in appearance relative to prior CT. 3. Pleuroparenchymal scarring and thickening in the right upper lobe. 4. Right upper lobe cavitation best seen by CT is not well visualized radiographically. Electronically Signed   By: Ashley Royalty M.D.   On: 06/20/2017 02:43   Ct Chest Wo Contrast  Result Date: 06/18/2017 CLINICAL DATA:  COPD.  Lung nodule greater than or equal to 1 cm. EXAM: CT CHEST WITHOUT CONTRAST TECHNIQUE: Multidetector CT imaging of the chest was performed following the standard protocol without IV contrast. COMPARISON:  CT of the chest on 12/31/2016 FINDINGS: Cardiovascular: There is atherosclerotic calcification of the coronary arteries. No pericardial effusion. There is atherosclerotic calcification of the thoracic aorta not associated with aneurysm. Mediastinum/Nodes: No mediastinal, hilar, or axillary adenopathy. The visualized portion of the thyroid gland has a normal appearance. Lungs/Pleura: Marked emphysematous changes are present. Cavitary lesion within the right upper lobe measures 2.5 x 1.4 cm, previously 2.0 x 0.9 cm. There is increased pleural thickening adjacent to the cavitary lesion. No  associated osseous involvement. Stable appearance of a second small right upper lobe nodule measuring 6 mm on image 76 of series 8. There is stable appearance of a partially calcified left upper lobe nodule measuring 9 x 7 mm on image 73 of series 8. Two new opacities are identified in the medial lower lobes. In the medial right lower lobe, a small nodule with early cavitation is 8 x 10 mm on image 107 of series 8. The medial left lower lobe, a small nodule with early cavitation is 7 x 8 mm on image 113 of series 8. There are reticulonodular changes in the lung bases bilaterally. No focal consolidations or pleural effusions. No evidence for pulmonary edema. Upper Abdomen: No acute abnormality. Musculoskeletal: Stable wedge compression fractures at numerous thoracic levels. No acute fractures. No lytic or blastic lesions. IMPRESSION: 1. Increased size and wall thickness of right upper lobe cavitary lesion. 2. New small cavitary lesions within the right  lower lobe and left lower lobe. 3. Stable partially calcified nodule within the superior segment of the left lower lobe 4.  Consider follow-up PET-CT or tissue sampling. 5.  Aortic atherosclerosis.  (ICD10-I70.0) 6. Coronary artery disease. 7.  Emphysema (ICD10-J43.9) Electronically Signed   By: Nolon Nations M.D.   On: 06/18/2017 23:28   Dg Chest Port 1 View  Result Date: 06/20/2017 CLINICAL DATA:  62 year old female with left-sided pneumothorax. Subsequent encounter. EXAM: PORTABLE CHEST 1 VIEW COMPARISON:  06/19/2017 06/18/2017 chest x-ray. 06/18/2017 chest CT. FINDINGS: Left chest tube remains in place without plain film evidence of pneumothorax. Left apical pleural thickening. CT detected tiny left lateral loculated pneumothorax not appreciated on present exam. Left pulmonary nodules and right apical cavitary lesion as noted on recent CT. Baseline chronic lung changes. Heart size top-normal.  Calcified aorta. IMPRESSION: Left chest tube in place without  evidence of pneumothorax. Left lung nodules and right upper lobe cavitary lesion as noted on recent CT. Aortic Atherosclerosis (ICD10-I70.0). Electronically Signed   By: Genia Del M.D.   On: 06/20/2017 08:02    ASSESSMENT and PLAN Spontaneous left PTX s/p chest tube placement -pcxr reviewed shows resolution of PTX -off sxn for over 24 hours, does have some newly identified chest discomfort.  Unclear if this is just pleural irritation from the tube versus recurrence of pneumothorax Plan Stat repeat chest x-ray now looking specifically for recurrent pneumothorax Will d/w Dr [place] Bells re: referral to thoracic for CT pleurodesis vs VATS/ pleurodesis vs remove CT ?  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

## 2017-06-20 NOTE — Progress Notes (Signed)
Chest tube removed per protocol.  Pt tolerated well.  Will continue to monitor.

## 2017-06-20 NOTE — Progress Notes (Signed)
PROGRESS NOTE    Darlene [name]  CVE:[id number] DOB: 06-03-1956 DOA: 06/17/2017 PCP: [name], MD   Brief Narrative:  Darlene [name] is a 62 y.o. 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 She Presented to the ER today with reports of productive cough with new left-sided chest pain radiating to the lateral chest towards the back. This is also associated with shortness of breath.  Symptoms were of sudden onset today.  Room air pulse oximetry was documented as "low" in triage and she was subsequently placed on 3 L nasal cannula oxygen with pulse oximetry increasing to 95%.  X-ray performed in the ER revealed new moderate left pneumothorax with slight mediastinal shift to the right.  White count 14,600 differential not obtained.  Subsequently a chest tube has been placed by EDP with near complete reexpansion of the left lung with only a very small left lateral pneumothorax at the left base.  CVTS/Gerhardt evaluated and felt as if there was no air leak and recommended Pulmonary Evaluation. Pulmonary Dr. Chase Caller managing Chest Tube and ordered CT Chest. Chest Tube removed today and Re-consulted Dr. Servando Snare for possible VATS Pleurodesis.   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 ([place])   Osteoporosis   COPD with emphysema (HCC)   Pneumothorax   Nodule of apex of right lung  Acute respiratory failure with hypoxia 2/2 Spontaneous pneumothorax -Patient presented with sudden onset chest discomfort and shortness of breath with radiographic evidence of moderate left pneumothorax with essential complete resolution after placement of  chest tube in ER -Suspect ruptured bleb as etiology -C/w Incentive Spirometry -Combination narcotic and nonnarcotic analgesia -TCTS evaluated and recommended Pulmonary Consultation as they are not managing  -Pulmonary consulted for further evaluation and recommendations and recommending continuing Chest Tube for 1 day with suction and then 1 day without for 3 days prior for evaluation; Patient to have Chest tube removed today per Pulmonary -? Considering VATS Pleurodesis/Blebectomy vs Chemical Pleurodesis; Reconsulted Dr. Servando Snare for evaluation  -Pulmonary obtained CT of Chest; CT Chest showed increased size and wall thickness of RUL cavitary Lesion and patient had new small Cavitary lesions within the RLL and LLL. There was also stable partially calcified Nodule within the superior segment of the LLL. -Per Dr. Chase Caller will need to Call TCTS to discuss VATS Pleurodesis/Blebectomy due to High Re-occurrence and called and spoke with TCTS PA who will notify Dr. Servando Snare -Chest tube to be removed today and repeat CXR -CXR after Chest Tube Removal showed Hyperinflated lungs. No pneumothorax status post left-sided chest tube removal. Aortic atherosclerosis. Chronic pleuroparenchymal change at the right lung apex  COPD with emphysema  -New onset cough without fever or chills and no evidence of pneumonia on chest x-ray -Possible early viral syndrome so checked respiratory viral panel but Negative -With minor respiratory symptoms and requirement for chest tube we will utilize empiric Levaquin especially with presentation of leukocytosis and continue for now  -Blood cultures Show NGTD at 3 days, sputum culture although anticipate will be negative -Continue preadmission Symbicort (use therapeutic substitution Dulera) -Started Guaifenesin 1200 mg po BID -No indication for steroids at this juncture -Follows with  [name] in the outpatient setting with last evaluation August 2018 -Pulmonary Consulted and  recommending Triple Inhaler/Bronchiodilator Therapy -COPD showed Emphysema and lesions mentioned as above.  -Pulmonary Also adding Spiriva and will continue -Repeat CXR in AM   Tobacco abuse -Admits to smoking 1-1/2 pack cigarettes per day -Nicotine patch -Smoking Cessation Counseling given   Severe protein-calorie malnutrition/ Pulmonary cachexia due to COPD  -Patient reports 20 pound weight loss over the past 12 months; ? Related to 2 cm RUL Lung Nodule -Continue preadmission protein beverages -Nutrition consultation -Recommending Ensure Enlive po BID and Magic Cup BID -With new Cavitary Lesion it is concerning so will r/o TB so Quantiferon Gold ordered. Patient on Airborne Precautions  Anxiety and Depression  -Continue preadmission Alprazolam 0.24 mg po q8hprn -Continue Cymbalta if still taking prior to admission  Osteoporosis -Apparent new diagnosis -Has been prescribed Fosamax but has not taken yet -Discussed necessity to begin this medication and determine if can tolerate noting multiple risk factors for worsening osteoporosis: Postmenopausal, history of frequent steroid pulse tapers, protein calorie malnutrition; also discuss unwanted sequelae such as spontaneous spinal compression fractures, wrist or hip fractures with falls, spontaneous rib fractures and patient w/ COPD and coughing -C/w Vitamin D 50,000 units po q7day -Patient aware other drugs are available if unable to tolerate Fosamax  Right Lung Apical Nodule -Seen previously in June -Pulmonary obtaining CT of Chest w/o Contrast and showed increased size and wall thickness and had new small cavitary lesions in RLL and LLL  -After discussion with Pulmonary patient does not want to have it Biopsied -Pulmonary r/o other etiologies of Cavitary lesions so they have ordered ANA Negative, ANCA was positive (4.7), GBM was 3 and Quantiferion-Gold pending -Patient moved to Negative Pressure room and placed on Airborne  Precautions  Hypokalemia  -Patient's K+ was 3.4 and improved to 4.0 -Continue to Monitor and Replete as Necessary -Repeat CMP in AM   Leukocytosis -Improved. WBC went from 16.2 -> 14.8 -> 8.9 -?Reactive -C/w Abx but changed to po Levofloxacin  -Blood Cx x2 show NGTD at 3 days -Continue to Monitor for S/Sx of Infection -Repeat CXR in AM and Obtaining CT of Chest w/o Contrast as above  Sinus Tachycardia, improving  -? From Respiratory Issues vs. Pain -Given IV bolus 1 Liter; Started Maintenance IVF at NS 75 mL/hr and will continue  -Continue to Monitor with Telemetry   DVT prophylaxis: SCDs Code Status: FULL CODE Family Communication: No family present at bedside Disposition Plan: Remain Inpatient and will D/C when clear from Pulmonary and TCTS   Consultants:   Cardiothoracic Surgery  Pulmonary   Procedures:  Left Chest Tube    Antimicrobials:  Anti-infectives (From admission, onward)   Start     Dose/Rate Route Frequency Ordered Stop   06/20/17 1200  levofloxacin (LEVAQUIN) tablet 500 mg     500 mg Oral Daily 06/19/17 1214 06/22/17 1159   06/17/17 1245  levofloxacin (LEVAQUIN) IVPB 750 mg  Status:  Discontinued     750 mg 100 mL/hr over 90 Minutes Intravenous Every 24 hours 06/17/17 1242 06/19/17 1214     Subjective: Seen and examined and was feeling better today. Had some pain where Chest Tube was inserted. Feels like she was breathing better and not on O2. No nausea or vomiting.   Objective: Vitals:   06/20/17 0413 06/20/17 0817 06/20/17 0841 06/20/17 1342  BP: 127/72 121/77  117/68  Pulse: 96 (!) 101  (!) 114  Resp: 19 16  (!)  22  Temp: 98.1 F (36.7 C) 97.9 F (36.6 C)  98.6 F (37 C)  TempSrc: Oral Oral  Oral  SpO2: 100% 95% 96% 95%  Weight: 40.1 kg (88 lb 6.4 oz)     Height:        Intake/Output Summary (Last 24 hours) at 06/20/2017 2019 Last data filed at 06/20/2017 1700 Gross per 24 hour  Intake 720 ml  Output 0 ml  Net 720 ml   Filed Weights     06/17/17 2002 06/18/17 0541 06/20/17 0413  Weight: 38.9 kg (85 lb 11.2 oz) 38.8 kg (85 lb 8.6 oz) 40.1 kg (88 lb 6.4 oz)   Examination: Physical Exam:  Constitutional: Thin Cachetic Caucasian female who is in NAD Eyes: Sclerae anicteric. Lids normal ENMT: External Ears and nose appear normal. MMM Neck: Supple with no JVD Respiratory: Diminished to auscultation with no appreciable wheezing/rales/rhonchi. Cardiovascular: RRR; No m/r/g. Has Left CT in place Abdomen: Soft, NT, ND. Bowel sounds present GU: Deferred.  Musculoskeletal: No contractures; No cyanosis Skin: Warm and Dry; no appreciable rashes or lesions Neurologic: CN 2-12 grossly intact. No appreciable focal deficits Psychiatric: Normal mood and affect. Intact judgement and insight  Data Reviewed: I have personally reviewed following labs and imaging studies  CBC: Recent Labs  Lab 06/17/17 0934 06/18/17 0311 06/19/17 0311 06/20/17 0435  WBC 14.6* 16.2* 14.8* 8.9  NEUTROABS  --   --  11.3* 6.4  HGB 14.3 12.1 10.9* 11.3*  HCT 44.2 38.1 35.5* 35.8*  MCV 107.0* 107.3* 108.6* 106.2*  PLT 328 277 256 458   Basic Metabolic Panel: Recent Labs  Lab 06/17/17 0934 06/18/17 0311 06/19/17 0311 06/20/17 0435  NA 136 137 130* 136  K 4.2 3.4* 4.0 3.8  CL 96* 99* 95* 102  CO2 28 31 29 28   GLUCOSE 166* 90 107* 108*  BUN 9 <5* <5* <5*  CREATININE 0.52 0.39* 0.34* 0.36*  CALCIUM 9.4 9.5 8.6* 8.4*  MG  --   --  1.6* 1.9  PHOS  --   --  2.4* 3.2   GFR: Estimated Creatinine Clearance: 46.2 mL/min (A) (by C-G formula based on SCr of 0.36 mg/dL (L)). Liver Function Tests: Recent Labs  Lab 06/18/17 0311 06/19/17 0311 06/20/17 0435  AST 16 15 15   ALT 11* 11* 11*  ALKPHOS 61 54 52  BILITOT 1.0 0.9 0.8  PROT 5.9* 5.8* 5.4*  ALBUMIN 2.8* 2.6* 2.5*   No results for input(s): LIPASE, AMYLASE in the last 168 hours. No results for input(s): AMMONIA in the last 168 hours. Coagulation Profile: No results for input(s):  INR, PROTIME in the last 168 hours. Cardiac Enzymes: No results for input(s): CKTOTAL, CKMB, CKMBINDEX, TROPONINI in the last 168 hours. BNP (last 3 results) No results for input(s): PROBNP in the last 8760 hours. HbA1C: No results for input(s): HGBA1C in the last 72 hours. CBG: Recent Labs  Lab 06/19/17 2142 06/20/17 0811  GLUCAP 113* 113*   Lipid Profile: No results for input(s): CHOL, HDL, LDLCALC, TRIG, CHOLHDL, LDLDIRECT in the last 72 hours. Thyroid Function Tests: No results for input(s): TSH, T4TOTAL, FREET4, T3FREE, THYROIDAB in the last 72 hours. Anemia Panel: No results for input(s): VITAMINB12, FOLATE, FERRITIN, TIBC, IRON, RETICCTPCT in the last 72 hours. Sepsis Labs: No results for input(s): PROCALCITON, LATICACIDVEN in the last 168 hours.  Recent Results (from the past 240 hour(s))  Culture, blood (routine x 2) Call MD if unable to obtain prior to antibiotics being given  Status: None (Preliminary result)   Collection Time: 06/17/17  1:00 PM  Result Value Ref Range Status   Specimen Description BLOOD LEFT ANTECUBITAL  Final   Special Requests   Final    BOTTLES DRAWN AEROBIC AND ANAEROBIC Blood Culture adequate volume   Culture NO GROWTH 3 DAYS  Final   Report Status PENDING  Incomplete  Culture, blood (routine x 2) Call MD if unable to obtain prior to antibiotics being given     Status: None (Preliminary result)   Collection Time: 06/17/17  1:15 PM  Result Value Ref Range Status   Specimen Description BLOOD LEFT ARM  Final   Special Requests   Final    BOTTLES DRAWN AEROBIC AND ANAEROBIC Blood Culture adequate volume   Culture NO GROWTH 3 DAYS  Final   Report Status PENDING  Incomplete  Respiratory Panel by PCR     Status: None   Collection Time: 06/18/17  2:50 AM  Result Value Ref Range Status   Adenovirus NOT DETECTED NOT DETECTED Final   Coronavirus 229E NOT DETECTED NOT DETECTED Final   Coronavirus HKU1 NOT DETECTED NOT DETECTED Final   Coronavirus  NL63 NOT DETECTED NOT DETECTED Final   Coronavirus OC43 NOT DETECTED NOT DETECTED Final   Metapneumovirus NOT DETECTED NOT DETECTED Final   Rhinovirus / Enterovirus NOT DETECTED NOT DETECTED Final   Influenza A NOT DETECTED NOT DETECTED Final   Influenza B NOT DETECTED NOT DETECTED Final   Parainfluenza Virus 1 NOT DETECTED NOT DETECTED Final   Parainfluenza Virus 2 NOT DETECTED NOT DETECTED Final   Parainfluenza Virus 3 NOT DETECTED NOT DETECTED Final   Parainfluenza Virus 4 NOT DETECTED NOT DETECTED Final   Respiratory Syncytial Virus NOT DETECTED NOT DETECTED Final   Bordetella pertussis NOT DETECTED NOT DETECTED Final   Chlamydophila pneumoniae NOT DETECTED NOT DETECTED Final   Mycoplasma pneumoniae NOT DETECTED NOT DETECTED Final    Radiology Studies: Dg Chest 2 View  Result Date: 06/20/2017 CLINICAL DATA:  Dyspnea with chest tube EXAM: CHEST  2 VIEW COMPARISON:  Chest CT 06/18/2017 FINDINGS: Heart size is within normal limits. Aortic atherosclerosis without aneurysmal dilatation is seen at the arch. Emphysematous hyperinflation of the lungs. Left-sided chest tube is seen draped over the medial aspect of the left upper lobe. Streaky parenchymal opacities are seen in the right upper lobe. The cavitation noted within the right upper lobe is not well visualized radiographically. Pleural thickening is seen bilaterally right greater than left. No overt pulmonary edema. No effusion or appreciable pneumothorax. No aggressive osseous abnormality. IMPRESSION: 1. COPD. 2. Left apical chest tube in place, unchanged in appearance relative to prior CT. 3. Pleuroparenchymal scarring and thickening in the right upper lobe. 4. Right upper lobe cavitation best seen by CT is not well visualized radiographically. Electronically Signed   By: Ashley Royalty M.D.   On: 06/20/2017 02:43   Ct Chest Wo Contrast  Result Date: 06/18/2017 CLINICAL DATA:  COPD.  Lung nodule greater than or equal to 1 cm. EXAM: CT CHEST  WITHOUT CONTRAST TECHNIQUE: Multidetector CT imaging of the chest was performed following the standard protocol without IV contrast. COMPARISON:  CT of the chest on 12/31/2016 FINDINGS: Cardiovascular: There is atherosclerotic calcification of the coronary arteries. No pericardial effusion. There is atherosclerotic calcification of the thoracic aorta not associated with aneurysm. Mediastinum/Nodes: No mediastinal, hilar, or axillary adenopathy. The visualized portion of the thyroid gland has a normal appearance. Lungs/Pleura: Marked emphysematous changes are present. Cavitary lesion  within the right upper lobe measures 2.5 x 1.4 cm, previously 2.0 x 0.9 cm. There is increased pleural thickening adjacent to the cavitary lesion. No associated osseous involvement. Stable appearance of a second small right upper lobe nodule measuring 6 mm on image 76 of series 8. There is stable appearance of a partially calcified left upper lobe nodule measuring 9 x 7 mm on image 73 of series 8. Two new opacities are identified in the medial lower lobes. In the medial right lower lobe, a small nodule with early cavitation is 8 x 10 mm on image 107 of series 8. The medial left lower lobe, a small nodule with early cavitation is 7 x 8 mm on image 113 of series 8. There are reticulonodular changes in the lung bases bilaterally. No focal consolidations or pleural effusions. No evidence for pulmonary edema. Upper Abdomen: No acute abnormality. Musculoskeletal: Stable wedge compression fractures at numerous thoracic levels. No acute fractures. No lytic or blastic lesions. IMPRESSION: 1. Increased size and wall thickness of right upper lobe cavitary lesion. 2. New small cavitary lesions within the right lower lobe and left lower lobe. 3. Stable partially calcified nodule within the superior segment of the left lower lobe 4.  Consider follow-up PET-CT or tissue sampling. 5.  Aortic atherosclerosis.  (ICD10-I70.0) 6. Coronary artery disease. 7.   Emphysema (ICD10-J43.9) Electronically Signed   By: Nolon Nations M.D.   On: 06/18/2017 23:28   Dg Chest Port 1 View  Result Date: 06/20/2017 CLINICAL DATA:  Post chest tube removal. EXAM: PORTABLE CHEST 1 VIEW COMPARISON:  06/20/2017 CT 1452 hours and priors. FINDINGS: The heart size and mediastinal contours are within normal limits. Both lungs are clear. The visualized skeletal structures are left-sided chest tube has been removed. No apparent pneumothorax status post chest surgery removal. Hyperinflated appearance of the lungs with apical pleuroparenchymal scarring and thickening at the right lung apex. Heart is normal in size. There is minimal aortic atherosclerosis at the arch. No acute osseous abnormality. IMPRESSION: 1. Hyperinflated lungs. 2. No pneumothorax status post left-sided chest tube removal. 3. Aortic atherosclerosis. 4. Chronic pleuroparenchymal change at the right lung apex. Electronically Signed   By: Ashley Royalty M.D.   On: 06/20/2017 19:57   Dg Chest Port 1 View  Result Date: 06/20/2017 CLINICAL DATA:  Chest pain at chest tube site. EXAM: PORTABLE CHEST 1 VIEW COMPARISON:  06/20/2017 FINDINGS: Left chest tube tip is near the left lung apex and has been slightly pulled back or retracted. Negative for pneumothorax. Again noted is a 9 mm nodule in the left lung. Poorly defined right apical lesion is again noted. Trachea remains midline. Heart size is normal. Subtle densities at the lung bases probably represent some atelectasis. IMPRESSION: Left chest tube has mildly changed in position but appears to be in adequate position. No pneumothorax. Mild basilar atelectasis. Re- demonstration of lung lesions. Electronically Signed   By: Markus Daft M.D.   On: 06/20/2017 15:18   Dg Chest Port 1 View  Result Date: 06/20/2017 CLINICAL DATA:  62 year old female with left-sided pneumothorax. Subsequent encounter. EXAM: PORTABLE CHEST 1 VIEW COMPARISON:  06/19/2017 06/18/2017 chest x-ray. 06/18/2017  chest CT. FINDINGS: Left chest tube remains in place without plain film evidence of pneumothorax. Left apical pleural thickening. CT detected tiny left lateral loculated pneumothorax not appreciated on present exam. Left pulmonary nodules and right apical cavitary lesion as noted on recent CT. Baseline chronic lung changes. Heart size top-normal.  Calcified aorta. IMPRESSION: Left chest  tube in place without evidence of pneumothorax. Left lung nodules and right upper lobe cavitary lesion as noted on recent CT. Aortic Atherosclerosis (ICD10-I70.0). Electronically Signed   By: Genia Del M.D.   On: 06/20/2017 08:02   Scheduled Meds: . DULoxetine  30 mg Oral Daily  . enoxaparin (LOVENOX) injection  30 mg Subcutaneous Q24H  . feeding supplement (ENSURE ENLIVE)  237 mL Oral BID BM   Or  . feeding supplement  1 Container Oral BID BM  . guaiFENesin  1,200 mg Oral BID  . levofloxacin  500 mg Oral Daily  . mometasone-formoterol  2 puff Inhalation BID  . nicotine  21 mg Transdermal Daily  . pneumococcal 23 valent vaccine  0.5 mL Intramuscular Tomorrow-1000  . sodium chloride flush  3 mL Intravenous Q12H  . tiotropium  18 mcg Inhalation Daily  . Vitamin D (Ergocalciferol)  50,000 Units Oral Q7 days   Continuous Infusions: . sodium chloride 75 mL/hr (06/18/17 2147)    LOS: 3 days   Kerney Elbe, DO [facility] Pager [phone]  If 7PM-7AM, please contact night-coverage [email or web address] Password [facility] 06/20/2017, 8:19 PM

## 2017-06-21 ENCOUNTER — Telehealth: Payer: Self-pay | Admitting: Internal Medicine

## 2017-06-21 ENCOUNTER — Inpatient Hospital Stay (HOSPITAL_COMMUNITY): Payer: BC Managed Care – PPO

## 2017-06-21 LAB — COMPREHENSIVE METABOLIC PANEL
ALBUMIN: 2.7 g/dL — AB (ref 3.5–5.0)
ALT: 11 U/L — ABNORMAL LOW (ref 14–54)
ANION GAP: 7 (ref 5–15)
AST: 15 U/L (ref 15–41)
Alkaline Phosphatase: 56 U/L (ref 38–126)
BUN: 5 mg/dL — ABNORMAL LOW (ref 6–20)
CALCIUM: 9.1 mg/dL (ref 8.9–10.3)
CHLORIDE: 100 mmol/L — AB (ref 101–111)
CO2: 32 mmol/L (ref 22–32)
Creatinine, Ser: 0.44 mg/dL (ref 0.44–1.00)
GFR calc non Af Amer: >60 mL/min (ref >60)
GLUCOSE: 106 mg/dL — AB (ref 65–99)
Potassium: 4.3 mmol/L (ref 3.5–5.1)
SODIUM: 139 mmol/L (ref 135–145)
Total Bilirubin: 0.7 mg/dL (ref 0.3–1.2)
Total Protein: 5.8 g/dL — ABNORMAL LOW (ref 6.5–8.1)

## 2017-06-21 LAB — CBC WITH DIFFERENTIAL/PLATELET
BASOS PCT: 0 %
Basophils Absolute: 0 10*3/uL (ref 0.0–0.1)
EOS ABS: 0.4 10*3/uL (ref 0.0–0.7)
EOS PCT: 6 %
HCT: 38.8 % (ref 36.0–46.0)
HEMOGLOBIN: 12.3 g/dL (ref 12.0–15.0)
Lymphocytes Relative: 23 %
Lymphs Abs: 1.6 10*3/uL (ref 0.7–4.0)
MCH: 33.3 pg (ref 26.0–34.0)
MCHC: 31.7 g/dL (ref 30.0–36.0)
MCV: 105.1 fL — ABNORMAL HIGH (ref 78.0–100.0)
Monocytes Absolute: 0.9 10*3/uL (ref 0.1–1.0)
Monocytes Relative: 14 %
NEUTROS PCT: 57 %
Neutro Abs: 3.8 10*3/uL (ref 1.7–7.7)
PLATELETS: 298 10*3/uL (ref 150–400)
RBC: 3.69 MIL/uL — AB (ref 3.87–5.11)
RDW: 11.8 % (ref 11.5–15.5)
WBC: 6.7 10*3/uL (ref 4.0–10.5)

## 2017-06-21 LAB — PHOSPHORUS: PHOSPHORUS: 4.2 mg/dL (ref 2.5–4.6)

## 2017-06-21 LAB — MAGNESIUM: Magnesium: 1.9 mg/dL (ref 1.7–2.4)

## 2017-06-21 MED ORDER — NICOTINE 21 MG/24HR TD PT24: 21.0000 mg | MEDICATED_PATCH | Freq: Every day | TRANSDERMAL | 0 refills | Status: DC

## 2017-06-21 MED ORDER — GUAIFENESIN ER 600 MG PO TB12: 1200.0000 mg | ORAL_TABLET | Freq: Two times a day (BID) | ORAL | 0 refills | Status: DC

## 2017-06-21 MED ORDER — ENSURE ENLIVE PO LIQD: 237.0000 mL | Freq: Two times a day (BID) | ORAL | 12 refills | Status: DC

## 2017-06-21 MED ORDER — TIOTROPIUM BROMIDE MONOHYDRATE 18 MCG IN CAPS: 18.0000 ug | ORAL_CAPSULE | Freq: Every day | RESPIRATORY_TRACT | 12 refills | Status: DC

## 2017-06-21 MED ORDER — IPRATROPIUM-ALBUTEROL 0.5-2.5 (3) MG/3ML IN SOLN: 3.0000 mL | RESPIRATORY_TRACT | 0 refills | Status: DC | PRN

## 2017-06-21 NOTE — Discharge Summary (Signed)
Physician Discharge Summary  SUI KASPAREK [facility]:[id number] DOB: 28-Nov-1955 DOA: 06/17/2017  PCP: Darlene Austin, MD  Admit date: 06/17/2017 Discharge date: 06/21/2017  Admitted From: Home Disposition:  Home  Recommendations for Outpatient Follow-up:  1. Follow up with PCP in 1-2 weeks 2. Follow up with [name] in Pulmonary as an outpatient in 1-2 weeks 3. Please obtain CMP/CBC, Mag, Phos in one week 4. Please follow up on the following pending results: Quantiferon Gold ordered by Pulmonary Team  Home Health: No Equipment/Devices: None  Discharge Condition: Stable CODE STATUS: FULL CODE  Diet recommendation: Heart Healthy Diet  Brief/Interim Summary: Darlene Nuttle Crabtreeis a 62 y.o.femalewith medical history significant forsevere COPD not on home O2, continued tobacco abuse, osteoporosis, and anxiety disorder and other comorbids. She Presented to the ER today with reports of productive cough with new left-sided chest pain radiating to the lateral chest towards the back. This is also associated with shortness of breath. Symptoms were of sudden onset today. Room air pulse oximetry was documented as "low" in triage andshe was subsequently placed on 3 L nasal cannula oxygen with pulse oximetry increasing to 95%. X-ray performed in the ER revealed new moderate left pneumothorax with slight mediastinal shift to the right.White count 14,600 differential not obtained. Subsequently a chest tube was placed by EDP with near complete reexpansion of the left lung with only a very small left lateral pneumothorax at the left base. CVTS/Gerhardt evaluated and felt as if there was no air leak and recommended Pulmonary Evaluation. Pulmonary managed Chest Tube and ordered CT Chest. Chest Tube removed and Re-consulted Dr. Servando Snare for possible VATS Pleurodesis however patient is not a candidate at this point. Pulmonary recommended no further workup and patient was deemed medically stable to D/C Home and  follow up with PCP and Pulmonary as an outpatient.   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 ([place])   Osteoporosis   COPD with emphysema (HCC)   Pneumothorax   Nodule of apex of right lung  Acute respiratory failure with hypoxia 2/2Spontaneous pneumothorax, improved -Patient presented with sudden onset chest discomfort and shortness of breath with radiographic evidence of moderate left pneumothorax with essential complete resolution after placement of chest tube in ER -Suspect ruptured bleb as etiology -C/w Incentive Spirometry -Combination narcotic and nonnarcotic analgesia -TCTS evaluated and recommended Pulmonary Consultation as they are not managing  -Pulmonary consulted for further evaluation and recommendations and recommending continuing Chest Tube for 1 day with suction and then 1 day without for 3 days prior for evaluation; Patient to have Chest tube removed today per Pulmonary -? Considering VATS Pleurodesis/Blebectomy vs Chemical Pleurodesis; Reconsulted Dr. Servando Snare for evaluation but after discussion with his PA she spoke to Dr. Servando Snare directly who recommended patient was not a candidate  -Pulmonary obtained CT of Chest; CT Chest showed increased size and wall thickness of RUL cavitary Lesion and patient had new small Cavitary lesions within the RLL and LLL. There was also stable partially calcified Nodule within the superior segment of the LLL. -Per Dr. Chase Caller will need to Call TCTS to discuss VATS Pleurodesis/Blebectomy due to High Re-occurrence and called and spoke with TCTS PA who will notify Dr. Servando Snare -Chest tube to be removed and repeat CXR showed  no Pneumothorax -CXR after Chest Tube Removal showed  Hyperinflated lungs. No pneumothorax status post left-sided chest tube removal. Aortic atherosclerosis. Chronic pleuroparenchymal change at the right lung apex -PCCM had no other recommendations and signed off -Ambulatory Walk Screen done and patient had no O2 needs -Follow up with PCP and with [name] as an outpatient   COPD with emphysema -New onset cough without fever or chills and no evidence of pneumonia on chest x-ray -Possible early viral syndrome so checked respiratory viral panel but Negative -With minor respiratory symptoms and requirement for chest tube we will utilize empiric Levaquin especially with presentation of leukocytosis and continue for now  -Blood cultures Show NGTD at 5 days, sputum culture never obtained  -Continue preadmission Symbicort(use therapeutic substitution Dulera) -Started Guaifenesin 1200 mg po BID -No indication for steroids at this juncture -Follows with [name] in the outpatient setting with last evaluation August 2018 -Pulmonary Consulted and recommending Triple Inhaler/Bronchiodilator Therapy -COPD showed Emphysema and lesions mentioned as above.  -Pulmonary Also adding Spiriva and will continue at D/C -Repeat CXR as an outpatient and follow up with [name] for Hospital Follow Up  Tobacco abuse -Admits to smoking 1-1/2 pack cigarettes per day -Nicotine patch provided at D/C -Smoking Cessation Counseling given   Severe protein-calorie malnutrition/Pulmonary cachexia due to COPD  -Patient reports 20 pound weight loss over the past 12 months; ? Related to 2 cm RUL Lung Nodule -Continue preadmission protein beverages -Nutrition consultation -Recommending Ensure Enlive po BID and Magic Cup BID and will continue Ensure at D/C -With new Cavitary Lesion it is concerning so will r/o TB so Quantiferon Gold ordered. Patient on Airborne Precautions but Quantiferon Gold still pending   Anxiety and Depression  -Continue preadmission Alprazolam 0.24  mg po q8hprn -Continue Cymbalta if still taking prior to admission  Osteoporosis -Apparent new diagnosis -Has been prescribed Fosamax but has not taken yet -Discussed necessity to begin this medication and determine if can tolerate noting multiple risk factors for worsening osteoporosis: Postmenopausal, history of frequent steroid pulse tapers, protein calorie malnutrition;also discuss unwanted sequelae such as spontaneous spinal compression fractures, wrist or hip fractures with falls, spontaneous rib fractures and patient w/COPD and coughing -C/w Vitamin D 50,000 units po q7day -Patient aware other drugs are available if unable to tolerate Fosamax  Right Lung Apical Nodule -Seen previously in June -Pulmonary obtained CT of Chest w/o Contrast and showed increased size and wall thickness and had new small cavitary lesions in RLL and LLL  -After discussion with Pulmonary patient does not want to have it Biopsied -Pulmonary r/o other etiologies of Cavitary lesions so they have ordered ANA Negative, ANCA was positive (4.7), GBM was 3 and Quantiferion-Gold still pending -Patient moved to Negative Pressure room and placed on Airborne Precautions -Follow up with Pulmonary [name] as an outpatient   Hypokalemia  -Patient's K+ was 3.4 and improved to 4.3 -Continue to Monitor and Replete as Necessary -Repeat CMP as an outpatient  Leukocytosis -Improved. WBC went from 16.2 -> 14.8 -> 8.9 -> 6.7 -?Reactive -Abx now D/C'd by Pulmonary   -Blood Cx x2 show NGTD at 5 days -Continue to Monitor for S/Sx of Infection -Repeat CXR as an outpatient and Obtaned CT of Chest w/o Contrast as above -Repeat CBC as an outpatient   Sinus Tachycardia, improving  -? From Respiratory Issues vs. Pain -Given IV bolus 1 Liter; Started Maintenance IVF at NS 75 mL/hr and continued until D/C  -Continue to Monitor with Telemetry  -  Follow up with PCP as an outpatient    Discharge Instructions  Allergies as  of 06/21/2017      Reactions   Codeine Nausea And Vomiting      Medication List    TAKE these medications   albuterol 108 (90 Base) MCG/ACT inhaler Commonly known as:  PROAIR HFA Inhale 2 puffs into the lungs every 6 (six) hours as needed for wheezing or shortness of breath.   ALPRAZolam 0.25 MG tablet Commonly known as:  XANAX Take 0.25 mg by mouth daily as needed for anxiety.   budesonide-formoterol 160-4.5 MCG/ACT inhaler Commonly known as:  SYMBICORT Inhale 2 puffs into the lungs 2 (two) times daily.   DULoxetine 60 MG capsule Commonly known as:  CYMBALTA Take 60 mg by mouth daily.   feeding supplement (ENSURE ENLIVE) Liqd Take 237 mLs by mouth 2 (two) times daily between meals.   guaiFENesin 600 MG 12 hr tablet Commonly known as:  MUCINEX Take 2 tablets (1,200 mg total) by mouth 2 (two) times daily.   ipratropium-albuterol 0.5-2.5 (3) MG/3ML Soln Commonly known as:  DUONEB Take 3 mLs by nebulization every 4 (four) hours as needed.   nicotine 21 mg/24hr patch Commonly known as:  NICODERM CQ - dosed in mg/24 hours Place 1 patch (21 mg total) onto the skin daily. Start taking on:  06/22/2017   tiotropium 18 MCG inhalation capsule Commonly known as:  SPIRIVA Place 1 capsule (18 mcg total) into inhaler and inhale daily. Start taking on:  06/22/2017   traMADol 50 MG tablet Commonly known as:  ULTRAM Take 50 mg by mouth every 6 (six) hours as needed. for pain   Vitamin D (Ergocalciferol) 50000 units Caps capsule Commonly known as:  DRISDOL Take 50,000 Units by mouth every 7 (seven) days.   Vitamin D 2000 units tablet Take 6,000 Units by mouth daily.       Allergies  Allergen Reactions  . Codeine Nausea And Vomiting   Consultations:  Pulmonary Dr. Ramaswamy/Dr. Lake Bells  Cardiothoracic Surgery Dr. Servando Snare  Procedures/Studies: Dg Chest 2 View  Result Date: 06/20/2017 CLINICAL DATA:  Dyspnea with chest tube EXAM: CHEST  2 VIEW COMPARISON:  Chest CT  06/18/2017 FINDINGS: Heart size is within normal limits. Aortic atherosclerosis without aneurysmal dilatation is seen at the arch. Emphysematous hyperinflation of the lungs. Left-sided chest tube is seen draped over the medial aspect of the left upper lobe. Streaky parenchymal opacities are seen in the right upper lobe. The cavitation noted within the right upper lobe is not well visualized radiographically. Pleural thickening is seen bilaterally right greater than left. No overt pulmonary edema. No effusion or appreciable pneumothorax. No aggressive osseous abnormality. IMPRESSION: 1. COPD. 2. Left apical chest tube in place, unchanged in appearance relative to prior CT. 3. Pleuroparenchymal scarring and thickening in the right upper lobe. 4. Right upper lobe cavitation best seen by CT is not well visualized radiographically. Electronically Signed   By: Ashley Royalty M.D.   On: 06/20/2017 02:43   Ct Chest Wo Contrast  Result Date: 06/18/2017 CLINICAL DATA:  COPD.  Lung nodule greater than or equal to 1 cm. EXAM: CT CHEST WITHOUT CONTRAST TECHNIQUE: Multidetector CT imaging of the chest was performed following the standard protocol without IV contrast. COMPARISON:  CT of the chest on 12/31/2016 FINDINGS: Cardiovascular: There is atherosclerotic calcification of the coronary arteries. No pericardial effusion. There is atherosclerotic calcification of the thoracic aorta not associated with aneurysm. Mediastinum/Nodes: No mediastinal, hilar, or axillary adenopathy. The  visualized portion of the thyroid gland has a normal appearance. Lungs/Pleura: Marked emphysematous changes are present. Cavitary lesion within the right upper lobe measures 2.5 x 1.4 cm, previously 2.0 x 0.9 cm. There is increased pleural thickening adjacent to the cavitary lesion. No associated osseous involvement. Stable appearance of a second small right upper lobe nodule measuring 6 mm on image 76 of series 8. There is stable appearance of a  partially calcified left upper lobe nodule measuring 9 x 7 mm on image 73 of series 8. Two new opacities are identified in the medial lower lobes. In the medial right lower lobe, a small nodule with early cavitation is 8 x 10 mm on image 107 of series 8. The medial left lower lobe, a small nodule with early cavitation is 7 x 8 mm on image 113 of series 8. There are reticulonodular changes in the lung bases bilaterally. No focal consolidations or pleural effusions. No evidence for pulmonary edema. Upper Abdomen: No acute abnormality. Musculoskeletal: Stable wedge compression fractures at numerous thoracic levels. No acute fractures. No lytic or blastic lesions. IMPRESSION: 1. Increased size and wall thickness of right upper lobe cavitary lesion. 2. New small cavitary lesions within the right lower lobe and left lower lobe. 3. Stable partially calcified nodule within the superior segment of the left lower lobe 4.  Consider follow-up PET-CT or tissue sampling. 5.  Aortic atherosclerosis.  (ICD10-I70.0) 6. Coronary artery disease. 7.  Emphysema (ICD10-J43.9) Electronically Signed   By: Nolon Nations M.D.   On: 06/18/2017 23:28   Dg Chest Port 1 View  Result Date: 06/21/2017 CLINICAL DATA:  Shortness of Breath EXAM: PORTABLE CHEST 1 VIEW COMPARISON:  June 20, 2017 FINDINGS: Lungs are mildly hyperexpanded. There is mild scarring in the right upper lobe. There is no edema or consolidation. Heart size and pulmonary vascularity are normal. No adenopathy. No pneumothorax evident. No appreciable bone lesions. There is aortic atherosclerosis. IMPRESSION: Scarring right upper lobe. No edema or consolidation. Stable cardiac silhouette. There is aortic atherosclerosis. Aortic Atherosclerosis (ICD10-I70.0). Electronically Signed   By: Lowella Grip III M.D.   On: 06/21/2017 07:58   Dg Chest Port 1 View  Result Date: 06/20/2017 CLINICAL DATA:  Post chest tube removal. EXAM: PORTABLE CHEST 1 VIEW COMPARISON:   06/20/2017 CT 1452 hours and priors. FINDINGS: The heart size and mediastinal contours are within normal limits. Both lungs are clear. The visualized skeletal structures are left-sided chest tube has been removed. No apparent pneumothorax status post chest surgery removal. Hyperinflated appearance of the lungs with apical pleuroparenchymal scarring and thickening at the right lung apex. Heart is normal in size. There is minimal aortic atherosclerosis at the arch. No acute osseous abnormality. IMPRESSION: 1. Hyperinflated lungs. 2. No pneumothorax status post left-sided chest tube removal. 3. Aortic atherosclerosis. 4. Chronic pleuroparenchymal change at the right lung apex. Electronically Signed   By: Ashley Royalty M.D.   On: 06/20/2017 19:57   Dg Chest Port 1 View  Result Date: 06/20/2017 CLINICAL DATA:  Chest pain at chest tube site. EXAM: PORTABLE CHEST 1 VIEW COMPARISON:  06/20/2017 FINDINGS: Left chest tube tip is near the left lung apex and has been slightly pulled back or retracted. Negative for pneumothorax. Again noted is a 9 mm nodule in the left lung. Poorly defined right apical lesion is again noted. Trachea remains midline. Heart size is normal. Subtle densities at the lung bases probably represent some atelectasis. IMPRESSION: Left chest tube has mildly changed in position but appears  to be in adequate position. No pneumothorax. Mild basilar atelectasis. Re- demonstration of lung lesions. Electronically Signed   By: Markus Daft M.D.   On: 06/20/2017 15:18   Dg Chest Port 1 View  Result Date: 06/20/2017 CLINICAL DATA:  62 year old female with left-sided pneumothorax. Subsequent encounter. EXAM: PORTABLE CHEST 1 VIEW COMPARISON:  06/19/2017 06/18/2017 chest x-ray. 06/18/2017 chest CT. FINDINGS: Left chest tube remains in place without plain film evidence of pneumothorax. Left apical pleural thickening. CT detected tiny left lateral loculated pneumothorax not appreciated on present exam. Left pulmonary  nodules and right apical cavitary lesion as noted on recent CT. Baseline chronic lung changes. Heart size top-normal.  Calcified aorta. IMPRESSION: Left chest tube in place without evidence of pneumothorax. Left lung nodules and right upper lobe cavitary lesion as noted on recent CT. Aortic Atherosclerosis (ICD10-I70.0). Electronically Signed   By: Genia Del M.D.   On: 06/20/2017 08:02   Dg Chest Port 1 View  Result Date: 06/18/2017 CLINICAL DATA:  Chest tube in place. EXAM: PORTABLE CHEST 1 VIEW COMPARISON:  06/17/2017 FINDINGS: The lungs are hyperinflated likely secondary to COPD. There is a left-sided chest tube with the tip at the apex. There is tiny pneumothorax along the left costophrenic angle. There is no focal consolidation. There is no pleural effusion or pneumothorax. The heart and mediastinal contours are unremarkable. The osseous structures are unremarkable. IMPRESSION: 1. Left-sided chest tube with the tip at the apex with a tiny pneumothorax along the left costophrenic angle which has decreased in size compared with the prior exam. Electronically Signed   By: Kathreen Devoid   On: 06/18/2017 09:04   Dg Chest Portable 1 View  Result Date: 06/17/2017 CLINICAL DATA:  Chest tube in place EXAM: PORTABLE CHEST 1 VIEW COMPARISON:  06/17/2017 FINDINGS: Interval placement of left chest tube. The pigtail is partially formed at the left apex. Near complete re-expansion of the left lung with only a very small left lateral pneumothorax at the left base. Left base atelectasis. Heart is normal size. IMPRESSION: Interval placement of left chest tube with pigtail partially formed in the left upper chest. Near complete re-expansion of the left lung with only a small residual left lateral basilar pneumothorax. Electronically Signed   By: Rolm Baptise M.D.   On: 06/17/2017 11:33   Dg Chest Portable 1 View  Result Date: 06/17/2017 CLINICAL DATA:  Shortness of breath and low oxygen saturation, with decreased  breath sounds on the left. EXAM: PORTABLE CHEST 1 VIEW COMPARISON:  Chest x-ray dated December 17, 2016. FINDINGS: The cardiomediastinal silhouette is normal in size. Normal pulmonary vascularity. New moderate left pneumothorax with slight mediastinal shift to the right. Emphysematous changes in both lungs again noted. 10 mm nodule in the superior segment of the left lower lobe is grossly unchanged. No acute osseous abnormality. IMPRESSION: 1. New moderate left pneumothorax with slight mediastinal shift to the right. 2. COPD.  10 mm nodule in the left lower lobe is grossly unchanged. Critical Value/emergent results were called by telephone at the time of interpretation on 06/17/2017 at 10:23 am to Dr. Duffy Bruce , who verbally acknowledged these results. Electronically Signed   By: Titus Dubin M.D.   On: 06/17/2017 10:23     Subjective: Seen and examined and in NAD. Felt better and weaned off of O2. No nausea or vomiting. No other complaints or concerns and ready to go home.  Discharge Exam: Vitals:   06/21/17 1200 06/21/17 1300  BP:  Pulse: (!) 105 (!) 113  Resp: 19 (!) 22  Temp:    SpO2: 96% 95%   Vitals:   06/21/17 1000 06/21/17 1100 06/21/17 1200 06/21/17 1300  BP:      Pulse: (!) 109 (!) 107 (!) 105 (!) 113  Resp: (!) 23 (!) 26 19 (!) 22  Temp:      TempSrc:      SpO2: 96% 94% 96% 95%  Weight:      Height:       General: Pt is alert, awake, not in acute distress; Thin Caucasian female in NAD Cardiovascular: Slightly Tachycardic Rate, S1/S2 +, no rubs, no gallops Respiratory: Diminished bilaterally, no wheezing, no rhonchi Abdominal: Soft, NT, ND, bowel sounds + Extremities: no edema, no cyanosis  The results of significant diagnostics from this hospitalization (including imaging, microbiology, ancillary and laboratory) are listed below for reference.    Microbiology: Recent Results (from the past 240 hour(s))  Culture, blood (routine x 2) Call MD if unable to obtain prior  to antibiotics being given     Status: None (Preliminary result)   Collection Time: 06/17/17  1:00 PM  Result Value Ref Range Status   Specimen Description BLOOD LEFT ANTECUBITAL  Final   Special Requests   Final    BOTTLES DRAWN AEROBIC AND ANAEROBIC Blood Culture adequate volume   Culture NO GROWTH 4 DAYS  Final   Report Status PENDING  Incomplete  Culture, blood (routine x 2) Call MD if unable to obtain prior to antibiotics being given     Status: None (Preliminary result)   Collection Time: 06/17/17  1:15 PM  Result Value Ref Range Status   Specimen Description BLOOD LEFT ARM  Final   Special Requests   Final    BOTTLES DRAWN AEROBIC AND ANAEROBIC Blood Culture adequate volume   Culture NO GROWTH 4 DAYS  Final   Report Status PENDING  Incomplete  Respiratory Panel by PCR     Status: None   Collection Time: 06/18/17  2:50 AM  Result Value Ref Range Status   Adenovirus NOT DETECTED NOT DETECTED Final   Coronavirus 229E NOT DETECTED NOT DETECTED Final   Coronavirus HKU1 NOT DETECTED NOT DETECTED Final   Coronavirus NL63 NOT DETECTED NOT DETECTED Final   Coronavirus OC43 NOT DETECTED NOT DETECTED Final   Metapneumovirus NOT DETECTED NOT DETECTED Final   Rhinovirus / Enterovirus NOT DETECTED NOT DETECTED Final   Influenza A NOT DETECTED NOT DETECTED Final   Influenza B NOT DETECTED NOT DETECTED Final   Parainfluenza Virus 1 NOT DETECTED NOT DETECTED Final   Parainfluenza Virus 2 NOT DETECTED NOT DETECTED Final   Parainfluenza Virus 3 NOT DETECTED NOT DETECTED Final   Parainfluenza Virus 4 NOT DETECTED NOT DETECTED Final   Respiratory Syncytial Virus NOT DETECTED NOT DETECTED Final   Bordetella pertussis NOT DETECTED NOT DETECTED Final   Chlamydophila pneumoniae NOT DETECTED NOT DETECTED Final   Mycoplasma pneumoniae NOT DETECTED NOT DETECTED Final    Labs: BNP (last 3 results) No results for input(s): BNP in the last 8760 hours. Basic Metabolic Panel: Recent Labs  Lab  06/17/17 0934 06/18/17 0311 06/19/17 0311 06/20/17 0435 06/21/17 0600  NA 136 137 130* 136 139  K 4.2 3.4* 4.0 3.8 4.3  CL 96* 99* 95* 102 100*  CO2 28 31 29 28  32  GLUCOSE 166* 90 107* 108* 106*  BUN 9 <5* <5* <5* 5*  CREATININE 0.52 0.39* 0.34* 0.36* 0.44  CALCIUM 9.4 9.5  8.6* 8.4* 9.1  MG  --   --  1.6* 1.9 1.9  PHOS  --   --  2.4* 3.2 4.2   Liver Function Tests: Recent Labs  Lab 06/18/17 0311 06/19/17 0311 06/20/17 0435 06/21/17 0600  AST 16 15 15 15   ALT 11* 11* 11* 11*  ALKPHOS 61 54 52 56  BILITOT 1.0 0.9 0.8 0.7  PROT 5.9* 5.8* 5.4* 5.8*  ALBUMIN 2.8* 2.6* 2.5* 2.7*   No results for input(s): LIPASE, AMYLASE in the last 168 hours. No results for input(s): AMMONIA in the last 168 hours. CBC: Recent Labs  Lab 06/17/17 0934 06/18/17 0311 06/19/17 0311 06/20/17 0435 06/21/17 0600  WBC 14.6* 16.2* 14.8* 8.9 6.7  NEUTROABS  --   --  11.3* 6.4 3.8  HGB 14.3 12.1 10.9* 11.3* 12.3  HCT 44.2 38.1 35.5* 35.8* 38.8  MCV 107.0* 107.3* 108.6* 106.2* 105.1*  PLT 328 277 256 254 298   Cardiac Enzymes: No results for input(s): CKTOTAL, CKMB, CKMBINDEX, TROPONINI in the last 168 hours. BNP: Invalid input(s): POCBNP CBG: Recent Labs  Lab 06/19/17 2142 06/20/17 0811  GLUCAP 113* 113*   D-Dimer No results for input(s): DDIMER in the last 72 hours. Hgb A1c No results for input(s): HGBA1C in the last 72 hours. Lipid Profile No results for input(s): CHOL, HDL, LDLCALC, TRIG, CHOLHDL, LDLDIRECT in the last 72 hours. Thyroid function studies No results for input(s): TSH, T4TOTAL, T3FREE, THYROIDAB in the last 72 hours.  Invalid input(s): FREET3 Anemia work up No results for input(s): VITAMINB12, FOLATE, FERRITIN, TIBC, IRON, RETICCTPCT in the last 72 hours. Urinalysis No results found for: COLORURINE, APPEARANCEUR, LABSPEC, [place], GLUCOSEU, [place], [place], [place], PROTEINUR, UROBILINOGEN, NITRITE, LEUKOCYTESUR Sepsis Labs Invalid input(s):  PROCALCITONIN,  WBC,  LACTICIDVEN Microbiology Recent Results (from the past 240 hour(s))  Culture, blood (routine x 2) Call MD if unable to obtain prior to antibiotics being given     Status: None (Preliminary result)   Collection Time: 06/17/17  1:00 PM  Result Value Ref Range Status   Specimen Description BLOOD LEFT ANTECUBITAL  Final   Special Requests   Final    BOTTLES DRAWN AEROBIC AND ANAEROBIC Blood Culture adequate volume   Culture NO GROWTH 4 DAYS  Final   Report Status PENDING  Incomplete  Culture, blood (routine x 2) Call MD if unable to obtain prior to antibiotics being given     Status: None (Preliminary result)   Collection Time: 06/17/17  1:15 PM  Result Value Ref Range Status   Specimen Description BLOOD LEFT ARM  Final   Special Requests   Final    BOTTLES DRAWN AEROBIC AND ANAEROBIC Blood Culture adequate volume   Culture NO GROWTH 4 DAYS  Final   Report Status PENDING  Incomplete  Respiratory Panel by PCR     Status: None   Collection Time: 06/18/17  2:50 AM  Result Value Ref Range Status   Adenovirus NOT DETECTED NOT DETECTED Final   Coronavirus 229E NOT DETECTED NOT DETECTED Final   Coronavirus HKU1 NOT DETECTED NOT DETECTED Final   Coronavirus NL63 NOT DETECTED NOT DETECTED Final   Coronavirus OC43 NOT DETECTED NOT DETECTED Final   Metapneumovirus NOT DETECTED NOT DETECTED Final   Rhinovirus / Enterovirus NOT DETECTED NOT DETECTED Final   Influenza A NOT DETECTED NOT DETECTED Final   Influenza B NOT DETECTED NOT DETECTED Final   Parainfluenza Virus 1 NOT DETECTED NOT DETECTED Final   Parainfluenza Virus 2 NOT DETECTED NOT  DETECTED Final   Parainfluenza Virus 3 NOT DETECTED NOT DETECTED Final   Parainfluenza Virus 4 NOT DETECTED NOT DETECTED Final   Respiratory Syncytial Virus NOT DETECTED NOT DETECTED Final   Bordetella pertussis NOT DETECTED NOT DETECTED Final   Chlamydophila pneumoniae NOT DETECTED NOT DETECTED Final   Mycoplasma pneumoniae NOT DETECTED  NOT DETECTED Final   Time coordinating discharge: 35 minutes  SIGNED:  Kerney Elbe, DO [facility] 06/21/2017, 1:43 PM Pager [phone]  If 7PM-7AM, please contact night-coverage [email or web address] Password [email or web address]

## 2017-06-21 NOTE — Progress Notes (Signed)
[facility] P[facility]M  Chart reviewed, CXR OK this AM Will need f/u with [name] in 2-4 weeks PCCM will sign off  Roselie Awkward, MD [place] [facility] Pager: [phone] Cell: [phone] After 3pm or if no response, call [phone]

## 2017-06-21 NOTE — Telephone Encounter (Signed)
Spoke with pt, she wants to know if MW can write her a note for work to be out until she can come see MW in the office. She wants to know if if MW would be willing to write a letter for disability. She wants to come in to talk about it but there are no appts for 30 mins on your schedule. She needs a hospital follow up because she was in the hospital recently. Please advise. Can we put pt in a 15 min slot? Would you be willing to help her get disability?

## 2017-06-21 NOTE — Progress Notes (Signed)
Pt. Ambulated in room while hooked up to continuous pulse oximetry. Pt. Maintained O2 sat's between 95-100%.

## 2017-06-21 NOTE — Telephone Encounter (Signed)
Ok to put in 15 min but do not overbook

## 2017-06-21 NOTE — Telephone Encounter (Signed)
Spoke with pt, scheduled for next available 15 minute slot.  Nothing further needed.

## 2017-06-22 ENCOUNTER — Encounter: Payer: Self-pay | Admitting: Internal Medicine

## 2017-06-22 ENCOUNTER — Ambulatory Visit: Payer: BC Managed Care – PPO | Admitting: Internal Medicine

## 2017-06-22 ENCOUNTER — Encounter: Payer: Self-pay | Admitting: *Deleted

## 2017-06-22 VITALS — BP 92/58 | HR 112 | Ht 64.0 in | Wt 84.0 lb

## 2017-06-22 DIAGNOSIS — J9383 Other pneumothorax: Secondary | ICD-10-CM | POA: Diagnosis not present

## 2017-06-22 DIAGNOSIS — J449 Chronic obstructive pulmonary disease, unspecified: Secondary | ICD-10-CM | POA: Diagnosis not present

## 2017-06-22 DIAGNOSIS — R918 Other nonspecific abnormal finding of lung field: Secondary | ICD-10-CM | POA: Diagnosis not present

## 2017-06-22 LAB — CULTURE, BLOOD (ROUTINE X 2)
CULTURE: NO GROWTH
Culture: NO GROWTH
SPECIAL REQUESTS: ADEQUATE
SPECIAL REQUESTS: ADEQUATE

## 2017-06-22 LAB — QUANTIFERON-TB GOLD PLUS (RQFGPL)
QUANTIFERON TB1 AG VALUE: 0.03 [IU]/mL
QUANTIFERON TB2 AG VALUE: 0.03 [IU]/mL
QuantiFERON Mitogen Value: 2.55 IU/mL
QuantiFERON Nil Value: 0.03 IU/mL

## 2017-06-22 LAB — QUANTIFERON-TB GOLD PLUS: QuantiFERON-TB Gold Plus: NEGATIVE

## 2017-06-22 MED ORDER — TIOTROPIUM BROMIDE MONOHYDRATE 2.5 MCG/ACT IN AERS: 2.0000 | INHALATION_SPRAY | Freq: Every day | RESPIRATORY_TRACT | 11 refills | Status: DC

## 2017-06-22 MED ORDER — TIOTROPIUM BROMIDE MONOHYDRATE 2.5 MCG/ACT IN AERS: 2.0000 | INHALATION_SPRAY | Freq: Every day | RESPIRATORY_TRACT | 0 refills | Status: DC

## 2017-06-22 NOTE — Patient Instructions (Addendum)
Continue symbicort 160 Take 2 puffs first thing in am and then another 2 puffs about 12 hours later.  Add spiriva 2 pffs each am  - call if insurance won't cover with list of alternatives   Only use your albuterol as a rescue medication to be used if you can't catch your breath by resting or doing a relaxed purse lip breathing pattern.  - The less you use it, the better it will work when you need it. - Ok to use up to 2 puffs  every 4 hours if you must but call for immediate appointment if use goes up over your usual need - Don't leave home without it !!  (think of it like the spare tire for your car)    You have severe copd and should consider filing for full disability at this point   Congratulations on not smoking, it's the most important aspect of your care  Please schedule a follow up office visit in 4 weeks, sooner if needed with cxr on return

## 2017-06-22 NOTE — Progress Notes (Addendum)
Subjective:    Patient ID: Darlene [name], female    DOB: Jan 26, 1956    MRN: [id number]      Brief patient profile:  14  yowf quit smoking 06/2017  dx with remote dx copd  By Clance self referred to pulmonary clinic 12/29/2012 with progressive doe x 06/2012   with GOLD III copd 06/22/2017      History of Present Illness  12/29/2012 1st pulmonary eval in EPIC era / [facility]  cc indolent onset progressive doe x 6 month getting groceries from car to [employer],  crossing parking lots. In am lots watery nasal congestion and cough > 30 min each am, assoc with chest discomfort midline during cough, prod sev tbsp white mucus,, some better p saba uses 4-5 x day.   rec Continue symbicort 160 Take 2 puffs first thing in am and then another 2 puffs about 12 hours later. Only use your albuterol as a rescue medication  Work on inhaler technique:   02/02/2013 f/u ov/Wert still smoking  Chief Complaint  Patient presents with  . COPD    Breathing is unchanged. Reports SOB, coughing, chest tightness.   not dosing symbicort q 12 and finding needs proair around 4 pm daily. Cough is min prod white thick mucus daily worse in am rec Continue symbicort 160 Take 2 puffs first thing in am and then another 2 puffs about 12 hours later.  Only use your albuterol as a rescue medication to be used if you can't catch your breath by resting or doing a relaxed purse lip breathing pattern. The less you use it, the better it will work when you need it.      08/04/2016  Re-establish  ov/Wert re:  Copd GOLD III/ still smoking  Chief Complaint  Patient presents with  . Pulmonary Consult    cough, SOB with physical activity, was recently on prednisone and ABX about 2 weeks ago, still has a lot of congestion   not much better p pred or  abx Still junky cough/ congestion in am > white thick mucus   Using proair 4 x daily  / maybe twice weekly wakes up wheezing but not bad enough usually to  use inhaler noct  Doe x car to back  steps MMRC2 = can't walk a nl pace on a flat grade s sob but does fine slow and flat eg ht ok but can't do wm  rec Plan A = Automatic =  Bevespi Take 2 puffs first thing in am and then another 2 puffs about 12 hours later.  Plan B = Backup Only use your albuterol (proair) as a rescue medication     01/28/2017  f/u ov/Wert re:  GOLD III/ MPN's  Chief Complaint  Patient presents with  . Follow-up    pt unable to complete PFT.  pt c/o sob with exertion, prod cough with mostly clear mucus.    some better doe since bevespi but still smoking and using saba way too much  Cough worse in am's no better on bevespi ? Better on sykbiocort Doe still = MMRC2  rec Plan A = Automatic = change from bevespi to  symbicort 160 Take 2 puffs first thing in am and then another 2 puffs about 12 hours later.  Work on inhaler technique:  relax and gently blow all the way out then take a nice smooth deep breath back in, triggering the inhaler at same time you start breathing in.  Hold for up to  5 seconds if you can. Blow out thru nose. Rinse and gargle with water when done Plan B = Backup Only use your albuterol as a rescue medication    Admit date: 06/17/2017 Discharge date: 06/21/2017     Recommendations for Outpatient Follow-up:  1. Follow up with PCP in 1-2 weeks 2. Follow up with [name] in Pulmonary as an outpatient in 1-2 weeks 3. Please obtain CMP/CBC, Mag, Phos in one week 4. 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[place])   Osteoporosis   COPD with emphysema (HCC)   Pneumothorax   Nodule of apex of right  lung  Acute respiratory failure with hypoxia 2/2Spontaneous pneumothorax, improved -Patient presented with sudden onset chest discomfort and shortness of breath with radiographic evidence of moderate left pneumothorax with essential complete resolution after placement of chest tube in ER -Suspect ruptured bleb as etiology -C/w Incentive Spirometry -Combination narcotic and nonnarcotic analgesia -TCTS evaluated and recommended Pulmonary Consultation as they are not managing  -Pulmonaryconsulted for further evaluation and recommendations and recommending continuing Chest Tube for 1 day with suction and then 1 day without for 3 days prior for evaluation;Patient to have Chest tube removed today per Pulmonary -? Considering VATS Pleurodesis/Blebectomy vs Chemical Pleurodesis; Reconsulted Dr. Servando Snare for evaluationbut after discussion with his PA she spoke to Dr. Servando Snare directly who recommended patient was not a candidate  -Pulmonary obtainedCT of Chest; CT Chest showed increased size and wall thickness of RUL cavitary Lesion and patient had new small Cavitary lesions within the RLL and LLL. There was also stable partially calcified Nodule within the superior segment of the LLL. -Per Dr. Chase Caller will need to Call TCTS to discuss VATS Pleurodesis/Blebectomy due to High Re-occurrenceand called and spoke with TCTS PA who will notify Dr. Servando Snare -Chest tube to be removed and repeat CXR showed no Pneumothorax -CXR after Chest Tube Removal showedHyperinflated lungs. No pneumothorax status post left-sided chest tube removal. Aortic atherosclerosis. Chronic pleuroparenchymal change at the right lung apex -PCCM had no other recommendations and signed off -Ambulatory Walk Screen done and patient had no O2 needs -Follow up with PCP and with [name] as an outpatient   COPD with emphysema -New onset cough without fever or chills and no evidence of pneumonia on chest x-ray -Possible early viral  syndrome so checked respiratory viral panel but Negative -With minor respiratory symptoms and requirement for chest tube we will utilize empiric Levaquin especially with presentation of leukocytosis and continue for now  -Blood cultures Show NGTD at5days, sputum culture never obtained  -Continue preadmission Symbicort(use therapeutic substitution Dulera) -Started Guaifenesin 1200 mg po BID -No indication for steroids at this juncture -Follows with [name] in the outpatient setting with last evaluation August 2018 -Pulmonary Consulted and recommending Triple Inhaler/Bronchiodilator Therapy -COPD showed Emphysema and lesions mentioned as above.  -Pulmonary Also adding Spirivaand will continue at D/C -Repeat CXR as an outpatient and follow up with 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 Related to 2 cm RUL Lung Nodule -Continue preadmission protein beverages -Nutrition consultation -Recommending Ensure Enlive po BID and Magic Cup BID and will continue Ensure at D/C -With new Cavitary Lesion it is concerning so will r/o TBso Quantiferon Gold ordered. Patient on Airborne Precautions but Quantiferon Gold still pending   Anxiety and Depression  -Continue preadmission Alprazolam 0.24 mg po q8hprn -Continue Cymbalta if still taking prior to admission  Osteoporosis -Apparent new diagnosis -Has been prescribed Fosamax but has not taken yet -Discussed necessity to begin this medication and determine if can tolerate noting multiple risk factors for worsening osteoporosis: Postmenopausal, history of frequent steroid pulse tapers, protein calorie malnutrition;also discuss unwanted sequelae such as spontaneous spinal compression  fractures, wrist or hip fractures with falls, spontaneous rib fractures and patient w/COPD and coughing -C/w Vitamin D 50,000 units po q7day -Patient aware other drugs are available if unable to tolerate Fosamax  Right Lung Apical Nodule -Seen previously in June -Pulmonary obtained CT of Chest w/o Contrast and showed increased size and wall thickness and had new small cavitary lesions in RLL and LLL  -After discussion with Pulmonary patient does not want to have it Biopsied -Pulmonary r/o other etiologies of Cavitary lesions so they have ordered ANANegative, ANCAwas positive (4.7), GBMwas 3and Quantiferion-Gold still pending -Patient moved to Negative Pressure roomand placed on Airborne Precautions -Follow up with Pulmonary [name] as an outpatient   Hypokalemia  -Patient's K+ was 3.4 and improved to 4.3 -Continue to Monitor and Replete as Necessary -Repeat CMP as an outpatient  Leukocytosis -Improved. WBC went from 16.2 -> 14.8 -> 8.9 -> 6.7 -?Reactive -Abx now D/C'd by Pulmonary   -Blood Cx x2 show NGTD at5days -Continue to Monitor for S/Sx of Infection -Repeat CXR as an outpatient and Obtaned CT of Chest w/o Contrastas above -Repeat CBC as an outpatient   Sinus Tachycardia, improving  -? From Respiratory Issues vs. Pain -Given IV bolus 1 Liter; Started Maintenance IVF at NS 75 mL/hr and continued until D/C  -Continue to Monitor with Telemetry  -Follow up with PCP as an outpatient         06/22/2017  f/u ov/Wert re:  GOLD III copd Chief Complaint  Patient presents with  . COPD    Wants to discuss possible disability.   doe = MMRC3 = can't walk 100 yards even at a slow pace at a flat grade s stopping due to sob  Can't do shopping Sleeps ok most nights on just 2 pillows Confused with how/ when to use meds now on multiple inhalers  No obvious day to day or daytime variability or assoc excess/ purulent sputum or mucus plugs or hemoptysis or cp or chest  tightness, subjective wheeze or overt sinus or hb symptoms. No unusual exposure hx or h/o childhood pna/ asthma or knowledge of premature birth.  Sleeping ok flat without nocturnal  or early am exacerbation  of respiratory  c/o's or need for noct saba. Also denies any obvious fluctuation of symptoms with weather or environmental changes or other aggravating or alleviating factors except as outlined above   Current Allergies, Complete Past Medical History, Past Surgical History, Family History, and Social History were reviewed in [employer] record.  ROS  The following are not active complaints unless bolded Hoarseness, sore throat, dysphagia, dental problems, itching, sneezing,  nasal congestion or discharge of excess mucus  or purulent secretions, ear ache,   fever, chills, sweats, unintended wt loss or wt gain, classically pleuritic or exertional cp,  orthopnea pnd or leg swelling, presyncope, palpitations, abdominal pain, anorexia, nausea, vomiting, diarrhea  or change in bowel habits or change in bladder habits, change in stools or change in urine, dysuria, hematuria,  rash, arthralgias, visual complaints, headache, numbness, weakness or ataxia or problems with walking or coordination,  change in mood/affect or memory.        Current Meds  Medication Sig  . albuterol (PROAIR HFA) 108 (90 BASE) MCG/ACT inhaler Inhale 2 puffs into the lungs every 6 (six) hours as needed for wheezing or shortness of breath.  . ALPRAZolam (XANAX) 0.25 MG tablet Take 0.25 mg by mouth daily as needed for anxiety.   . budesonide-formoterol (SYMBICORT) 160-4.5 MCG/ACT inhaler Inhale 2 puffs into the lungs 2 (two) times daily.  . Cholecalciferol (VITAMIN D) 2000 units tablet Take 6,000 Units by mouth daily.  . DULoxetine (CYMBALTA) 60 MG capsule Take 60 mg by mouth daily.  . feeding supplement, ENSURE ENLIVE, (ENSURE ENLIVE) LIQD Take 237 mLs by mouth 2 (two) times daily between meals.  Marland Kitchen  guaiFENesin (MUCINEX) 600 MG 12 hr tablet Take 2 tablets (1,200 mg total) by mouth 2 (two) times daily.  . traMADol (ULTRAM) 50 MG tablet Take 50 mg by mouth every 6 (six) hours as needed. for pain  . Vitamin D, Ergocalciferol, (DRISDOL) 50000 units CAPS capsule Take 50,000 Units by mouth every 7 (seven) days.  . [DISCONTINUED] ipratropium-albuterol (DUONEB) 0.5-2.5 (3) MG/3ML SOLN Take 3 mLs by nebulization every 4 (four) hours as needed.                                  Objective:   Physical Exam  amb wf nad  Vital signs reviewed - Note on arrival 02 sats  94% on RA       06/22/2017         84  01/28/2017       88  08/04/16 93 lb (42.2 kg)  04/22/15 102 lb (46.3 kg)  03/11/15 104 lb (47.2 kg)      02/02/13 91 lb (41.277 kg)  12/29/12 92 lb 9.6 oz (42.003 kg)        LUNGS: no acc muscle use,  slt  barrel chest chest with distant bs, minimal insp and exp rhonchi bilaterally    HEENT: nl dentition, turbinates bilaterally, and oropharynx. Nl external ear canals without cough reflex   NECK :  without JVD/Nodes/TM/ nl carotid upstrokes bilaterally   LUNGS: no acc muscle use,  Barrel  contour chest with distant bs/ min insp and exp rhonchi bilaterally   CV:  RRR  no s3 or murmur or increase in P2, and no edema   ABD:  soft and nontender with nl inspiratory excursion in the supine position. No bruits or organomegaly appreciated, bowel sounds nl  MS:  Nl gait/ ext warm without deformities, calf tenderness, cyanosis or clubbing No obvious joint restrictions   SKIN: warm and dry without lesions- L ct side looks fine     NEURO:  alert, approp, nl sensorium with  no motor or cerebellar deficits apparent.      CT chest 06/18/17   1. Increased size and wall thickness of right upper lobe cavitary lesion. 2. New small cavitary lesions within the right lower lobe and left lower lobe. 3. Stable  partially calcified nodule within the superior segment of the left lower  lobe 4.  Consider follow-up PET-CT or tissue sampling.       Assessment & Plan:

## 2017-06-23 ENCOUNTER — Encounter: Payer: Self-pay | Admitting: Internal Medicine

## 2017-06-23 NOTE — Assessment & Plan Note (Signed)
-   PFT's 02/02/2013   FEV1 1.28 (50%)   p  33% response to saba  with ratio 47 and DLCO 66 corrects to 73  - hfa 75 % 12/29/2012  > 75% 02/02/2013  -   alpha testing 02/02/2013 > MM - 08/04/2016    try bevespi > no decrease saba use - 01/28/2017    rechallenge with symb 160 2bid   - Reports quit smoking 06/2017 - 06/22/2017  After extensive coaching inhaler device  effectiveness =    50% > try add spiriva respimat - Spirometry 06/22/2017  FEV1 0.97 (39%)  Ratio 46 though < 6 sec exp     She has severe copd and   Group D in terms of symptom/risk and laba/lama/ICS  therefore appropriate rx at this point so need to add lama and maintain off cigs at all costs   Should pursue disability at this point as hard for her to walk to get to work though 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   Each maintenance medication was reviewed in detail including most importantly the difference between maintenance and prns and under what circumstances the prns are to be triggered using an action plan format that is not reflected in the computer generated alphabetically organized AVS.    Please see AVS for specific instructions unique to this office visit that I personally wrote and verbalized to the the pt in detail and then reviewed with pt  by my nurse highlighting any changes in therapy/plan of care  recommended at today's visit.

## 2017-06-23 NOTE — Assessment & Plan Note (Signed)
Recovering nicely but does risk recurrence/ advised

## 2017-06-23 NOTE — Assessment & Plan Note (Addendum)
PER CT CHEST ON 07/10/14... NEW 9 MM SLIGHTLY SPICULATED NODULE IN THE SUPERIOR ASPECT OF THE LEFT LOWER LOBE - PET 08/02/14 > 1. Rounded left lower lobe pulmonary nodule with trace metabolic activity. This combination of findings is not typical for bronchogenic carcinoma or carcinoma in situ and favors benign etiology. As this is a new finding, recommend follow-up CT thorax in 3 months.>  Dr Cyndia Bent rec f/u 3 m but never done  - CT chest 7/209/18 Lung nodules as described. The largest, which is cavitary, lies in the right upper lobe, measuring 20 x 9 mm. There is a new subcentimeter nodule in the inferior right upper lobe. There has been mild enlargement of a circumscribed oval left lower lobe Nodule > pt declined bx  - see CT chest 06/17/16 > again refuses bx / vasuclitis profile and Quant TB non dx   Can't reach any def conclusions about the nature of these lesions s bx  W/u in hops reviewed but not-dx/ strongly doubt M TB here but could have MAI involvement for sure

## 2017-07-18 ENCOUNTER — Emergency Department (HOSPITAL_COMMUNITY): Payer: BC Managed Care – PPO

## 2017-07-18 ENCOUNTER — Inpatient Hospital Stay (HOSPITAL_COMMUNITY)
Admission: EM | Admit: 2017-07-18 | Discharge: 2017-07-23 | DRG: 199 | Disposition: A | Discharge status: Home / Self Care | Payer: BC Managed Care – PPO | Attending: Internal Medicine | Admitting: Internal Medicine

## 2017-07-18 ENCOUNTER — Encounter (HOSPITAL_COMMUNITY): Payer: Self-pay | Admitting: *Deleted

## 2017-07-18 ENCOUNTER — Other Ambulatory Visit: Payer: Self-pay

## 2017-07-18 DIAGNOSIS — Z885 Allergy status to narcotic agent status: Secondary | ICD-10-CM | POA: Diagnosis not present

## 2017-07-18 DIAGNOSIS — J449 Chronic obstructive pulmonary disease, unspecified: Secondary | ICD-10-CM | POA: Diagnosis present

## 2017-07-18 DIAGNOSIS — Z820 Family history of epilepsy and other diseases of the nervous system: Secondary | ICD-10-CM | POA: Diagnosis not present

## 2017-07-18 DIAGNOSIS — F17210 Nicotine dependence, cigarettes, uncomplicated: Secondary | ICD-10-CM | POA: Diagnosis present

## 2017-07-18 DIAGNOSIS — F419 Anxiety disorder, unspecified: Secondary | ICD-10-CM | POA: Diagnosis present

## 2017-07-18 DIAGNOSIS — F329 Major depressive disorder, single episode, unspecified: Secondary | ICD-10-CM | POA: Diagnosis present

## 2017-07-18 DIAGNOSIS — J9382 Other air leak: Secondary | ICD-10-CM | POA: Diagnosis not present

## 2017-07-18 DIAGNOSIS — J9383 Other pneumothorax: Secondary | ICD-10-CM | POA: Diagnosis present

## 2017-07-18 DIAGNOSIS — J439 Emphysema, unspecified: Secondary | ICD-10-CM | POA: Diagnosis present

## 2017-07-18 DIAGNOSIS — Z7951 Long term (current) use of inhaled steroids: Secondary | ICD-10-CM

## 2017-07-18 DIAGNOSIS — E43 Unspecified severe protein-calorie malnutrition: Secondary | ICD-10-CM | POA: Diagnosis present

## 2017-07-18 DIAGNOSIS — M810 Age-related osteoporosis without current pathological fracture: Secondary | ICD-10-CM | POA: Diagnosis present

## 2017-07-18 DIAGNOSIS — Z79899 Other long term (current) drug therapy: Secondary | ICD-10-CM

## 2017-07-18 DIAGNOSIS — R918 Other nonspecific abnormal finding of lung field: Secondary | ICD-10-CM | POA: Diagnosis present

## 2017-07-18 DIAGNOSIS — Z681 Body mass index (BMI) 19 or less, adult: Secondary | ICD-10-CM

## 2017-07-18 DIAGNOSIS — J9311 Primary spontaneous pneumothorax: Secondary | ICD-10-CM | POA: Diagnosis not present

## 2017-07-18 DIAGNOSIS — J939 Pneumothorax, unspecified: Secondary | ICD-10-CM

## 2017-07-18 DIAGNOSIS — Z720 Tobacco use: Secondary | ICD-10-CM | POA: Diagnosis present

## 2017-07-18 DIAGNOSIS — Z79891 Long term (current) use of opiate analgesic: Secondary | ICD-10-CM

## 2017-07-18 DIAGNOSIS — C3491 Malignant neoplasm of unspecified part of right bronchus or lung: Secondary | ICD-10-CM | POA: Diagnosis present

## 2017-07-18 DIAGNOSIS — Z716 Tobacco abuse counseling: Secondary | ICD-10-CM | POA: Diagnosis not present

## 2017-07-18 DIAGNOSIS — Z800 Family history of malignant neoplasm of digestive organs: Secondary | ICD-10-CM | POA: Diagnosis not present

## 2017-07-18 DIAGNOSIS — R000 Tachycardia, unspecified: Secondary | ICD-10-CM | POA: Diagnosis present

## 2017-07-18 DIAGNOSIS — R0600 Dyspnea, unspecified: Secondary | ICD-10-CM

## 2017-07-18 LAB — BASIC METABOLIC PANEL
Anion gap: 14 (ref 5–15)
BUN: 8 mg/dL (ref 6–20)
CO2: 23 mmol/L (ref 22–32)
Calcium: 9.6 mg/dL (ref 8.9–10.3)
Chloride: 99 mmol/L — ABNORMAL LOW (ref 101–111)
Creatinine, Ser: 0.56 mg/dL (ref 0.44–1.00)
GFR calc Af Amer: >60 mL/min (ref >60)
GFR calc non Af Amer: >60 mL/min (ref >60)
Glucose, Bld: 149 mg/dL — ABNORMAL HIGH (ref 65–99)
Potassium: 4.6 mmol/L (ref 3.5–5.1)
Sodium: 136 mmol/L (ref 135–145)

## 2017-07-18 LAB — CBC
HCT: 44.9 % (ref 36.0–46.0)
Hemoglobin: 14.9 g/dL (ref 12.0–15.0)
MCH: 34.6 pg — ABNORMAL HIGH (ref 26.0–34.0)
MCHC: 33.2 g/dL (ref 30.0–36.0)
MCV: 104.2 fL — ABNORMAL HIGH (ref 78.0–100.0)
Platelets: 280 10*3/uL (ref 150–400)
RBC: 4.31 MIL/uL (ref 3.87–5.11)
RDW: 12.9 % (ref 11.5–15.5)
WBC: 9 10*3/uL (ref 4.0–10.5)

## 2017-07-18 LAB — I-STAT TROPONIN, ED: Troponin i, poc: 0.01 ng/mL (ref 0.00–0.08)

## 2017-07-18 MED ORDER — TRAMADOL HCL 50 MG PO TABS
50.0000 mg | ORAL_TABLET | Freq: Four times a day (QID) | ORAL | Status: DC | PRN
  Administered 2017-07-18: 50 mg via ORAL
  Filled 2017-07-18: qty 1

## 2017-07-18 MED ORDER — ALBUTEROL SULFATE (2.5 MG/3ML) 0.083% IN NEBU
2.5000 mg | INHALATION_SOLUTION | RESPIRATORY_TRACT | Status: DC | PRN
  Filled 2017-07-18: qty 3

## 2017-07-18 MED ORDER — SENNOSIDES-DOCUSATE SODIUM 8.6-50 MG PO TABS
1.0000 | ORAL_TABLET | Freq: Every evening | ORAL | Status: DC | PRN
  Administered 2017-07-21: 1 via ORAL
  Filled 2017-07-18: qty 1

## 2017-07-18 MED ORDER — ALPRAZOLAM 0.25 MG PO TABS
0.2500 mg | ORAL_TABLET | Freq: Every day | ORAL | Status: DC
  Administered 2017-07-19 – 2017-07-23 (×5): 0.25 mg via ORAL
  Filled 2017-07-18 (×5): qty 1

## 2017-07-18 MED ORDER — SODIUM CHLORIDE 0.9% FLUSH
3.0000 mL | Freq: Two times a day (BID) | INTRAVENOUS | Status: DC
  Administered 2017-07-18 – 2017-07-23 (×10): 3 mL via INTRAVENOUS

## 2017-07-18 MED ORDER — ONDANSETRON HCL 4 MG/2ML IJ SOLN: 4.0000 mg | Freq: Four times a day (QID) | INTRAMUSCULAR | Status: DC | PRN

## 2017-07-18 MED ORDER — TRAMADOL HCL 50 MG PO TABS
50.0000 mg | ORAL_TABLET | Freq: Four times a day (QID) | ORAL | Status: DC | PRN
  Administered 2017-07-19 – 2017-07-23 (×10): 50 mg via ORAL
  Filled 2017-07-18 (×10): qty 1

## 2017-07-18 MED ORDER — ZOLPIDEM TARTRATE 5 MG PO TABS: 5.0000 mg | ORAL_TABLET | Freq: Every evening | ORAL | Status: DC | PRN

## 2017-07-18 MED ORDER — ENOXAPARIN SODIUM 40 MG/0.4ML ~~LOC~~ SOLN: 40.0000 mg | SUBCUTANEOUS | Status: DC

## 2017-07-18 MED ORDER — ARFORMOTEROL TARTRATE 15 MCG/2ML IN NEBU
15.0000 ug | INHALATION_SOLUTION | Freq: Two times a day (BID) | RESPIRATORY_TRACT | Status: DC
  Administered 2017-07-18 – 2017-07-22 (×8): 15 ug via RESPIRATORY_TRACT
  Filled 2017-07-18 (×8): qty 2

## 2017-07-18 MED ORDER — ACETAMINOPHEN 650 MG RE SUPP: 650.0000 mg | Freq: Four times a day (QID) | RECTAL | Status: DC | PRN

## 2017-07-18 MED ORDER — LIDOCAINE HCL (PF) 1 % IJ SOLN
10.0000 mL | Freq: Once | INTRAMUSCULAR | Status: AC
  Administered 2017-07-18: 10 mL
  Filled 2017-07-18: qty 10

## 2017-07-18 MED ORDER — SODIUM CHLORIDE 0.9% FLUSH: 3.0000 mL | INTRAVENOUS | Status: DC | PRN

## 2017-07-18 MED ORDER — ONDANSETRON HCL 4 MG PO TABS: 4.0000 mg | ORAL_TABLET | Freq: Four times a day (QID) | ORAL | Status: DC | PRN

## 2017-07-18 MED ORDER — IPRATROPIUM BROMIDE 0.02 % IN SOLN
0.5000 mg | Freq: Four times a day (QID) | RESPIRATORY_TRACT | Status: DC
  Administered 2017-07-18 (×2): 0.5 mg via RESPIRATORY_TRACT
  Filled 2017-07-18 (×2): qty 2.5

## 2017-07-18 MED ORDER — NICOTINE 21 MG/24HR TD PT24
21.0000 mg | MEDICATED_PATCH | Freq: Every day | TRANSDERMAL | Status: DC
  Administered 2017-07-18 – 2017-07-23 (×6): 21 mg via TRANSDERMAL
  Filled 2017-07-18 (×6): qty 1

## 2017-07-18 MED ORDER — ACETAMINOPHEN 325 MG PO TABS
650.0000 mg | ORAL_TABLET | Freq: Four times a day (QID) | ORAL | Status: DC | PRN
  Administered 2017-07-18 – 2017-07-20 (×4): 650 mg via ORAL
  Filled 2017-07-18 (×4): qty 2

## 2017-07-18 MED ORDER — SODIUM CHLORIDE 0.9 % IV SOLN: 250.0000 mL | INTRAVENOUS | Status: DC | PRN

## 2017-07-18 MED ORDER — LIDOCAINE-EPINEPHRINE (PF) 2 %-1:200000 IJ SOLN
10.0000 mL | Freq: Once | INTRAMUSCULAR | Status: AC
  Administered 2017-07-18: 10 mL
  Filled 2017-07-18: qty 20

## 2017-07-18 MED ORDER — BUDESONIDE 0.5 MG/2ML IN SUSP
0.5000 mg | Freq: Two times a day (BID) | RESPIRATORY_TRACT | Status: DC
  Administered 2017-07-18 – 2017-07-23 (×10): 0.5 mg via RESPIRATORY_TRACT
  Filled 2017-07-18 (×12): qty 2

## 2017-07-18 MED ORDER — OXYCODONE HCL 5 MG PO TABS
5.0000 mg | ORAL_TABLET | ORAL | Status: AC | PRN
  Administered 2017-07-18 – 2017-07-19 (×2): 5 mg via ORAL
  Filled 2017-07-18 (×2): qty 1

## 2017-07-18 MED ORDER — TIOTROPIUM BROMIDE MONOHYDRATE 2.5 MCG/ACT IN AERS: 2.0000 | INHALATION_SPRAY | Freq: Every day | RESPIRATORY_TRACT | Status: DC

## 2017-07-18 MED ORDER — DULOXETINE HCL 60 MG PO CPEP
60.0000 mg | ORAL_CAPSULE | Freq: Every day | ORAL | Status: DC
  Administered 2017-07-19 – 2017-07-23 (×5): 60 mg via ORAL
  Filled 2017-07-18 (×5): qty 1

## 2017-07-18 MED ORDER — ENOXAPARIN SODIUM 30 MG/0.3ML ~~LOC~~ SOLN
30.0000 mg | SUBCUTANEOUS | Status: DC
  Filled 2017-07-18 (×4): qty 0.3

## 2017-07-18 MED ORDER — ENSURE ENLIVE PO LIQD
237.0000 mL | Freq: Two times a day (BID) | ORAL | Status: DC
  Administered 2017-07-23: 237 mL via ORAL
  Filled 2017-07-18: qty 237

## 2017-07-18 NOTE — ED Provider Notes (Signed)
[facility] EMERGENCY DEPARTMENT Provider Note   CSN: [id number] Arrival date & time: 07/18/17  1211     History   Chief Complaint Chief Complaint  Patient presents with  . Chest Pain  . Shortness of Breath  . Back Pain    HPI Darlene [name] is a 62 y.o. female.  HPI   62 year old female with left-sided chest and back pain.  Acute onset yesterday while cleaning her house.  Recent history significant for admission on 1/41/8 with a spontaneous left-sided pneumothorax.  This resolved with chest tube placement.  She was evaluated by cardiothoracic surgery but deemed not to be a good candidate for pleurodesis at that time.  She reports that she had been recovering well since discharge up until yesterday.  Past Medical History:  Diagnosis Date  . Anxiety   . COPD (chronic obstructive pulmonary disease) ([place])   . Emphysema   . Substance abuse ([place])    tobacco dependance    Patient Active Problem List   Diagnosis Date Noted  . Nodule of apex of right lung 06/18/2017  . Spontaneous pneumothorax on L 06/17/17 06/17/2017  . Acute respiratory failure with hypoxia ([place]) 06/17/2017  . Anxiety disorder 06/17/2017  . Tobacco abuse 06/17/2017  . Severe protein-calorie malnutrition ([place]) 06/17/2017  . 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 Osteoporosis 06/17/2017  . COPD with emphysema ([place]) 06/17/2017  . Pneumothorax 06/17/2017  . Anxiety   . COPD (chronic obstructive pulmonary disease) ([place])   . Substance abuse ([place])   . Multiple pulmonary nodules determined by computed tomography of lung 07/10/2014  . Cough 03/17/2013  . Chest pain 03/17/2013  . Cigarette smoker 12/30/2012  . COPD GOLD III  12/29/2012    Past Surgical History:  Procedure Laterality Date  . BACK SURGERY    . KNEE SURGERY      OB History    No data available       Home Medications    Prior to Admission medications   Medication Sig Start Date End Date Taking?  Authorizing Provider  acetaminophen (TYLENOL) 500 MG tablet Take 1,000 mg by mouth every 6 (six) hours as needed.   Yes [provider]  albuterol (PROAIR HFA) 108 (90 BASE) MCG/ACT inhaler Inhale 2 puffs into the lungs every 6 (six) hours as needed for wheezing or shortness of breath. 02/09/13  Yes Tanda Rockers, MD  ALPRAZolam Duanne Moron) 0.25 MG tablet Take 0.25 mg by mouth daily.    Yes [provider]  Artificial Tear Ointment (DRY EYES OP) Place 1 drop into both eyes daily as needed.   Yes [provider]  budesonide-formoterol (SYMBICORT) 160-4.5 MCG/ACT inhaler Inhale 2 puffs into the lungs 2 (two) times daily. 01/28/17  Yes Tanda Rockers, MD  Cholecalciferol (VITAMIN D) 2000 units tablet Take 6,000 Units by mouth daily.   Yes [provider]  DULoxetine (CYMBALTA) 60 MG capsule Take 60 mg by mouth daily.   Yes [provider]  feeding supplement, ENSURE ENLIVE, (ENSURE ENLIVE) LIQD Take 237 mLs by mouth 2 (two) times daily between meals. 06/21/17  Yes Sheikh, Omair Latif, DO  guaiFENesin (MUCINEX) 600 MG 12 hr tablet Take 2 tablets (1,200 mg total) by mouth 2 (two) times daily. Patient taking differently: Take 1,200 mg by mouth daily.  06/21/17  Yes Sheikh, Omair Latif, DO  nicotine (NICODERM CQ - DOSED IN MG/24 HOURS) 21 mg/24hr patch Place 1 patch (21 mg total) onto the skin  daily. 06/22/17  Yes Sheikh, Omair Latif, DO  Tiotropium Bromide Monohydrate (SPIRIVA RESPIMAT) 2.5 MCG/ACT AERS Inhale 2 puffs into the lungs daily. 06/22/17  Yes Tanda Rockers, MD  traMADol (ULTRAM) 50 MG tablet Take 50 mg by mouth every 6 (six) hours as needed. for pain 05/15/17  Yes [provider]  Vitamin D, Ergocalciferol, (DRISDOL) 50000 units CAPS capsule Take 50,000 Units by mouth every 14 (fourteen) days.    Yes [provider]  Tiotropium Bromide Monohydrate (SPIRIVA RESPIMAT) 2.5 MCG/ACT AERS Inhale 2 puffs into the lungs daily. Patient not taking:  Reported on 07/18/2017 06/22/17   Tanda Rockers, MD    Family History Family History  Problem Relation Age of Onset  . Alzheimer's disease Mother   . Cancer Paternal Aunt        colon    Social History Social History   Tobacco Use  . Smoking status: Former Smoker    Packs/day: 1.50    Years: 42.00    Pack years: 63.00    Types: Cigarettes    Last attempt to quit: 06/16/2017    Years since quitting: 0.0  . Smokeless tobacco: Never Used  . Tobacco comment: will attend cessation class at [facility]  Substance Use Topics  . Alcohol use: Yes    Alcohol/week: 3.0 oz    Types: 5 Cans of beer per week    Comment: 3-5 cans three to four timesa week  . Drug use: No     Allergies   Codeine   Review of Systems Review of Systems  All systems reviewed and negative, other than as noted in HPI.  Physical Exam Updated Vital Signs BP (!) 147/91   Pulse (!) 109   Temp 98.2 F (36.8 C) (Oral)   Resp 19   Ht 5\' 4"  (1.626 m)   Wt 40.8 kg (90 lb)   SpO2 95%   BMI 15.45 kg/m   Physical Exam  Constitutional: She appears well-developed and well-nourished. No distress.  HENT:  Head: Normocephalic and atraumatic.  Eyes: Conjunctivae are normal. Right eye exhibits no discharge. Left eye exhibits no discharge.  Neck: Neck supple.  Cardiovascular: Normal rate, regular rhythm and normal heart sounds. Exam reveals no gallop and no friction rub.  No murmur heard. Pulmonary/Chest: Effort normal. No respiratory distress.  Laying in bed.  Appears comfortable.  Speaks in complete sentences.  Breath sounds mildly diminished on the left as compared to the right.  Abdominal: Soft. She exhibits no distension. There is no tenderness.  Musculoskeletal: She exhibits no edema or tenderness.  Neurological: She is alert.  Skin: Skin is warm and dry.  Psychiatric: She has a normal mood and affect. Her behavior is normal. Thought content normal.  Nursing note and vitals reviewed.    ED Treatments /  Results  Labs (all labs ordered are listed, but only abnormal results are displayed) Labs Reviewed  BASIC METABOLIC PANEL - Abnormal; Notable for the following components:      Result Value   Chloride 99 (*)    Glucose, Bld 149 (*)    All other components within normal limits  CBC - Abnormal; Notable for the following components:   MCV 104.2 (*)    MCH 34.6 (*)    All other components within normal limits  I-STAT TROPONIN, ED    EKG  EKG Interpretation None       Radiology Dg Chest 2 View  Result Date: 07/18/2017 CLINICAL DATA:  Chest pain, shortness of  breath. EXAM: CHEST  2 VIEW COMPARISON:  Radiograph June 21, 2016. FINDINGS: The heart size and mediastinal contours are within normal limits. Moderate size left pneumothorax is noted on the order of approximately 20%. Right lung is clear. Hyperexpansion of the lungs is noted. No definite pleural effusion is noted. Nodular density is noted in the left midlung. The visualized skeletal structures are unremarkable. IMPRESSION: Moderate size left pneumothorax is noted. Critical Value/emergent results were called by telephone at the time of interpretation on 07/18/2017 at 12:56 pm to Dr. Marda Stalker , who verbally acknowledged these results. Nodular density is noted in left midlung; CT scan of the chest is recommended further evaluation. Hyperexpansion of the lungs is noted suggesting chronic obstructive pulmonary disease. Electronically Signed   By: Marijo Conception, M.D.   On: 07/18/2017 12:56    Procedures CHEST TUBE INSERTION Date/Time: 07/18/2017 3:50 PM Performed by: Virgel Manifold, MD Authorized by: Virgel Manifold, MD   Consent:    Consent obtained:  Verbal   Consent given by:  Patient   Risks discussed:  Bleeding, incomplete drainage, nerve damage, damage to surrounding structures and pain Pre-procedure details:    Skin preparation:  ChloraPrep Anesthesia (see MAR for exact dosages):    Anesthesia method:  Local  infiltration   Local anesthetic:  Lidocaine 1% WITH epi Procedure details:    Placement location:  L anterior   Scalpel size:  11   Tube size (Fr):  20   Ultrasound guidance: no     Tension pneumothorax: no     Drainage characteristics:  Air only   Suture material:  0 silk   Dressing:  Petrolatum-impregnated gauze and 4x4 sterile gauze Post-procedure details:    Post-insertion x-ray findings comment:  Tube at periphery of thorax, but functional.    Patient tolerance of procedure:  Tolerated well, no immediate complications    CRITICAL CARE Performed by: Virgel Manifold Total critical care time: 35 minutes Critical care time was exclusive of separately billable procedures and treating other patients. Critical care was necessary to treat or prevent imminent or life-threatening deterioration. Critical care was time spent personally by me on the following activities: development of treatment plan with patient and/or surrogate as well as nursing, discussions with consultants, evaluation of patient's response to treatment, examination of patient, obtaining history from patient or surrogate, ordering and performing treatments and interventions, ordering and review of laboratory studies, ordering and review of radiographic studies, pulse oximetry and re-evaluation of patient's condition.   (including critical care time)  Medications Ordered in ED Medications  lidocaine (PF) (XYLOCAINE) 1 % injection 10 mL (not administered)     Initial Impression / Assessment and Plan / ED Course  I have reviewed the triage vital signs and the nursing notes.  Pertinent labs & imaging results that were available during my care of the patient were reviewed by me and considered in my medical decision making (see chart for details).     62 year old female with recurrence of spontaneous left pneumothorax.  She does not appear to be acutely distressed but moderate in size and chest tube was again placed.  Dr.  Servando Snare, cardiothoracic surgery, currently in surgery but message relayed to him that patient will be readmitted to the hospital. I was advised that he would round on her but requesting CCM/pulmonology manage thoracostomy tube. Appreciate CCM's help and hospitalist service for admission.   Final Clinical Impressions(s) / ED Diagnoses   Final diagnoses:  Spontaneous pneumothorax    ED Discharge  Orders    None       Virgel Manifold, MD 07/18/17 [phone]

## 2017-07-18 NOTE — ED Triage Notes (Addendum)
Pt reports having chest pain, back pain and sob since yesterday. Recently had pneumothorax. spo 95%. Audible lung sounds bilateral at triage, more diminished on left.

## 2017-07-18 NOTE — Consult Note (Signed)
PULMONARY / CRITICAL CARE MEDICINE   Name: Darlene [name] MRN: [id number] DOB: May 03, 1956    ADMISSION DATE:  07/18/2017 CONSULTATION DATE:  07/18/17  REFERRING MD:  Dr. Wilson [name]   CHIEF COMPLAINT:  Chest Pain   HISTORY OF PRESENT ILLNESS:  62 y/o F with GOLD III COPD & ongoing smoking who presented to [facility] on 2/4 with complaints of ~ 24 hours of chest and back pain with associated shortness of breath.    She was recently admitted from 1/4-1/8 with a spontaneous pneumothorax.  A chest tube was placed per the EDP.  CVTS evaluated her at that time for possible VATS pleurodesis and it was felt at that time that she was not a candidate for surgery.  Dr. Chase [name] then recommended to reconsult CVTS to discuss VATS pleurodesis / blebectomy.  In the interim, PTX resolved and chest tube removed. She was discharged home with plans for follow up with Pulmonary.    The patient reports she began having pain in her back on 2/3 which progressed to her chest 2/4.  She states she continues to smoke. She reported pain with deep inspiration and splinted from taking a deep breath.  Has been taking mucinex for cough to loosen secretions.  On arrival, CXR demonstrated a moderate left pneumothorax.  Left small bore catheter was placed by the EDP on 2/4, placed to 20 cm suction.  Patient states she is reluctant to have any further procedures completed in regards to PTX.   PCCM called for evaluation of pneumothorax.    PAST MEDICAL HISTORY :  She  has a past medical history of Anxiety, COPD (chronic obstructive pulmonary disease) ([place]), Emphysema, and Substance abuse ([place]).  PAST SURGICAL HISTORY: She  has a past surgical history that includes Back surgery and Knee surgery.  Allergies  Allergen Reactions  . Codeine Nausea And Vomiting    No current facility-administered medications on file prior to encounter.    Current Outpatient Medications on File Prior to Encounter  Medication Sig  . acetaminophen  (TYLENOL) 500 MG tablet Take 1,000 mg by mouth every 6 (six) hours as needed.  Marland Kitchen albuterol (PROAIR HFA) 108 (90 BASE) MCG/ACT inhaler Inhale 2 puffs into the lungs every 6 (six) hours as needed for wheezing or shortness of breath.  . ALPRAZolam (XANAX) 0.25 MG tablet Take 0.25 mg by mouth daily.   . Artificial Tear Ointment (DRY EYES OP) Place 1 drop into both eyes daily as needed.  . budesonide-formoterol (SYMBICORT) 160-4.5 MCG/ACT inhaler Inhale 2 puffs into the lungs 2 (two) times daily.  . Cholecalciferol (VITAMIN D) 2000 units tablet Take 6,000 Units by mouth daily.  . DULoxetine (CYMBALTA) 60 MG capsule Take 60 mg by mouth daily.  . feeding supplement, ENSURE ENLIVE, (ENSURE ENLIVE) LIQD Take 237 mLs by mouth 2 (two) times daily between meals.  Marland Kitchen guaiFENesin (MUCINEX) 600 MG 12 hr tablet Take 2 tablets (1,200 mg total) by mouth 2 (two) times daily. (Patient taking differently: Take 1,200 mg by mouth daily. )  . nicotine (NICODERM CQ - DOSED IN MG/24 HOURS) 21 mg/24hr patch Place 1 patch (21 mg total) onto the skin daily.  . Tiotropium Bromide Monohydrate (SPIRIVA RESPIMAT) 2.5 MCG/ACT AERS Inhale 2 puffs into the lungs daily.  . traMADol (ULTRAM) 50 MG tablet Take 50 mg by mouth every 6 (six) hours as needed. for pain  . Vitamin D, Ergocalciferol, (DRISDOL) 50000 units CAPS capsule Take 50,000 Units by mouth every 14 (fourteen) days.   Marland Kitchen  Tiotropium Bromide Monohydrate (SPIRIVA RESPIMAT) 2.5 MCG/ACT AERS Inhale 2 puffs into the lungs daily. (Patient not taking: Reported on 07/18/2017)    FAMILY HISTORY:  Her indicated that her mother is alive. She indicated that her father is deceased. She indicated that her paternal aunt is alive.   SOCIAL HISTORY: She  reports that she quit smoking about 4 weeks ago. Her smoking use included cigarettes. She has a 63.00 pack-year smoking history. she has never used smokeless tobacco. She reports that she drinks about 3.0 oz of alcohol per week. She reports  that she does not use drugs.  REVIEW OF SYSTEMS:  All negative; except for those that are bolded, which indicate positives.  Constitutional: weight loss, weight gain, night sweats, fevers, chills, fatigue, weakness.  HEENT: headaches, sore throat, sneezing, nasal congestion, post nasal drip, difficulty swallowing, tooth/dental problems, visual complaints, visual changes, ear aches. Neuro: difficulty with speech, weakness, numbness, ataxia. CV:  chest pain, orthopnea, PND, swelling in lower extremities, dizziness, palpitations, syncope, pleuritic chest pain. Resp: cough, hemoptysis, dyspnea, wheezing. GI: heartburn, indigestion, abdominal pain, nausea, vomiting, diarrhea, constipation, change in bowel habits, loss of appetite, hematemesis, melena, hematochezia.  GU: dysuria, change in color of urine, urgency or frequency, flank pain, hematuria. MSK: joint pain or swelling, decreased range of motion, left sided back pain. Psych: change in mood or affect, depression, anxiety, suicidal ideations, homicidal ideations. Skin: rash, itching, bruising.   SUBJECTIVE:  Has mild discomfort at chest tube site but tolerating OK.  Is anxious regarding surgical procedure (VATS), but willing to consider.  She will discuss with her daughter who is an Therapist, sports and also open to discussing with CVTS team.  VITAL SIGNS: BP (!) 136/94   Pulse (!) 118   Temp 98.2 F (36.8 C) (Oral)   Resp (!) 23   Ht 5\' 4"  (1.626 m)   Wt 90 lb (40.8 kg)   SpO2 96%   BMI 15.45 kg/m   HEMODYNAMICS:    VENTILATOR SETTINGS:    INTAKE / OUTPUT: No intake/output data recorded.  PHYSICAL EXAMINATION: General:  Adult female, no distress. HEENT: [place] / AT.  MM pink/moist. Neuro: A&O x 3, no deficits. CV: RRR, no M/R/G. PULM: Resps even.  Clear bilaterally.  Small bore chest tube in place on left side. GI: Soft, non-tender, bsx4 active. Extremities: Warm/dry, no edema. Skin: No rashes or lesions.  LABS:  BMET Recent Labs   Lab 07/18/17 1223  NA 136  K 4.6  CL 99*  CO2 23  BUN 8  CREATININE 0.56  GLUCOSE 149*    Electrolytes Recent Labs  Lab 07/18/17 1223  CALCIUM 9.6    CBC Recent Labs  Lab 07/18/17 1223  WBC 9.0  HGB 14.9  HCT 44.9  PLT 280    Coag's No results for input(s): APTT, INR in the last 168 hours.  Sepsis Markers No results for input(s): LATICACIDVEN, PROCALCITON, O2SATVEN in the last 168 hours.  ABG No results for input(s): PHART, PCO2ART, PO2ART in the last 168 hours.  Liver Enzymes No results for input(s): AST, ALT, ALKPHOS, BILITOT, ALBUMIN in the last 168 hours.  Cardiac Enzymes No results for input(s): TROPONINI, PROBNP in the last 168 hours.  Glucose No results for input(s): GLUCAP in the last 168 hours.  Imaging Dg Chest 2 View  Result Date: 07/18/2017 CLINICAL DATA:  Chest pain, shortness of breath. EXAM: CHEST  2 VIEW COMPARISON:  Radiograph June 21, 2016. FINDINGS: The heart size and mediastinal contours are within normal  limits. Moderate size left pneumothorax is noted on the order of approximately 20%. Right lung is clear. Hyperexpansion of the lungs is noted. No definite pleural effusion is noted. Nodular density is noted in the left midlung. The visualized skeletal structures are unremarkable. IMPRESSION: Moderate size left pneumothorax is noted. Critical Value/emergent results were called by telephone at the time of interpretation on 07/18/2017 at 12:56 pm to Dr. Marda Stalker , who verbally acknowledged these results. Nodular density is noted in left midlung; CT scan of the chest is recommended further evaluation. Hyperexpansion of the lungs is noted suggesting chronic obstructive pulmonary disease. Electronically Signed   By: Marijo Conception, M.D.   On: 07/18/2017 12:56     STUDIES:  CXR 2/4 >> moderate left pneumothorax   CULTURES: None.  ANTIBIOTICS: None.  SIGNIFICANT EVENTS: 2/04  Admit with chest / back pain, found to have recurrent  pneumothorax, small bore chest tube placed.  LINES/TUBES: Left sided pigtail chest tube 2/4 >   DISCUSSION: 62 y/o F admitted on 2/4 with complaints of back / chest pain.  Found to have moderate left pneumothorax for which small bore chest tube was placed. PCCM asked to consult for chest tube management.  ASSESSMENT / PLAN:  Left sided PTX - recurrent, had prior PTX just 1 month prior.  At the time, had initially declined surgical intervention and CVTS also felt she wasn't a candidate.  She is now s/p small bore chest tube which was placed in ED on presentation 2/4. Hx COPD - follows with [name]. Tobacco dependence. P: Continue chest tube to suction. Needs CVTS consult for consideration VATS pleurodesis / blebectomy (given her recurrent PTX and hx emphysema with continued tobacco dependence, she ultimately needs definitive treatment vs temporary fix with chest tube). Follow CXR. Continue BD's. Bronchial hygiene. Tobacco cessation counseling. F/u with pulmonary as outpatient.  Rest per primary team. PCCM will follow for chest tube.   Montey Hora, [facility] Pager: [phone]  or [phone] 07/18/2017, 4:35 PM  Attending:  I have seen and examined the patient with nurse practitioner/resident and agree with the note above.  We formulated the plan together and I elicited the following history.    Subjective: Patient developed pleuritic type chest pain one day PTA and increasing dypsnea. She stated some improvement in dyspnea after insertion of the pig tail catheter in the ER.  Objective: Vitals:   07/18/17 1600 07/18/17 1615 07/18/17 1630 07/18/17 1645  BP: (!) 163/89 (!) 153/99 (!) 149/93 (!) 144/90  Pulse: (!) 119 (!) 115 (!) 112 (!) 105  Resp: 20 (!) 26 (!) 23 (!) 26  Temp:      TempSrc:      SpO2: 98% 96% 91% 98%  Weight:      Height:         No intake or output data in the 24 hours ending 07/18/17 1703  General:  awake, alert and oriented. HEENT: No subq air, no jvd Chest: bilateral breath sounds. No wheezes, crackles or rhonchi Cor: normal s1s2, regular rhythm. No rub. Abd: soft, non tender. Ext: +2 pulses, no cyanosis or pedal edema.   CBC    Component Value Date/Time   WBC 9.0 07/18/2017 1223   RBC 4.31 07/18/2017 1223   HGB 14.9 07/18/2017 1223   HCT 44.9 07/18/2017 1223   PLT 280 07/18/2017 1223   MCV 104.2 (H) 07/18/2017 1223   MCV 99.2 (A)  04/22/2015 1547   MCH 34.6 (H) 07/18/2017 1223   MCHC 33.2 07/18/2017 1223   RDW 12.9 07/18/2017 1223   LYMPHSABS 1.6 06/21/2017 0600   MONOABS 0.9 06/21/2017 0600   EOSABS 0.4 06/21/2017 0600   BASOSABS 0.0 06/21/2017 0600    BMET    Component Value Date/Time   NA 136 07/18/2017 1223   K 4.6 07/18/2017 1223   CL 99 (L) 07/18/2017 1223   CO2 23 07/18/2017 1223   GLUCOSE 149 (H) 07/18/2017 1223   BUN 8 07/18/2017 1223   CREATININE 0.56 07/18/2017 1223   CALCIUM 9.6 07/18/2017 1223   GFRNONAA >60 07/18/2017 1223   GFRAA >60 07/18/2017 1223    CXR images  DG Chest Portable 1 View  Final Result    DG Chest 2 View  Final Result       Impression/Plan: 1. Spontaneous pneumothorax 2. COPD/Severe, Emphysema 3. Tobacco use  Plan: 1. Continue chest tube drainage to wall suction via a Pleur evac collection system. 2. Maintain spo2 > 90% 3. Thoracic surgery consultation. Patient would benefit from some form of pleurodesis. 4. DVT prophylaxis.  My cc time 35 minutes  Jesus Genera, MD [facility] Pager: [phone]

## 2017-07-18 NOTE — H&P (Signed)
History and Physical    ELBERT POLYAKOV QIH:[id number] DOB: 19-Aug-1955 DOA: 07/18/2017  PCP: [name], MD  Patient coming from: Home  Chief Complaint: chest pain, back pain, shortness of breath  HPI: DAYLAN BOGGESS is a 62 y.o. female with medical history significant of COPD, tobacco abuse, who presents to the hospital with 2 day history of chest pain, back pain, shortness of breath. She was recently hospitalized from 1/4-1/8 with spontaneous left pneumothorax, treated with chest tube. During that admission, cardiothoracic surgery did not feel she was a candidate for VATS pleurodesis/blebectomy. She improved and was discharged home. She now returns with chest pain, back pain, and SOB with exertion since yesterday.    ED Course: CXR revealed moderate left pneumothorax. PCCM was consulted. Chest tube placed.   Review of Systems: As per HPI otherwise 10 point review of systems negative.   Past Medical History:  Diagnosis Date  . Anxiety   . COPD (chronic obstructive pulmonary disease) ([place])   . Emphysema   . Substance abuse ([place])    tobacco dependance    Past Surgical History:  Procedure Laterality Date  . BACK SURGERY    . KNEE SURGERY       reports that she quit smoking about 4 weeks ago. Her smoking use included cigarettes. She has a 63.00 pack-year smoking history. she has never used smokeless tobacco. She reports that she drinks about 3.0 oz of alcohol per week. She reports that she does not use drugs.  Allergies  Allergen Reactions  . Codeine Nausea And Vomiting    Family History  Problem Relation Age of Onset  . Alzheimer's disease Mother   . Cancer Paternal Aunt        colon    Prior to Admission medications   Medication Sig Start Date End Date Taking? Authorizing Provider  acetaminophen (TYLENOL) 500 MG tablet Take 1,000 mg by mouth every 6 (six) hours as needed.   Yes [provider]  albuterol (PROAIR HFA) 108 (90 BASE) MCG/ACT inhaler Inhale  2 puffs into the lungs every 6 (six) hours as needed for wheezing or shortness of breath. 02/09/13  Yes Tanda Rockers, MD  ALPRAZolam Duanne Moron) 0.25 MG tablet Take 0.25 mg by mouth daily.    Yes [provider]  Artificial Tear Ointment (DRY EYES OP) Place 1 drop into both eyes daily as needed.   Yes [provider]  budesonide-formoterol (SYMBICORT) 160-4.5 MCG/ACT inhaler Inhale 2 puffs into the lungs 2 (two) times daily. 01/28/17  Yes Tanda Rockers, MD  Cholecalciferol (VITAMIN D) 2000 units tablet Take 6,000 Units by mouth daily.   Yes [provider]  DULoxetine (CYMBALTA) 60 MG capsule Take 60 mg by mouth daily.   Yes [provider]  feeding supplement, ENSURE ENLIVE, (ENSURE ENLIVE) LIQD Take 237 mLs by mouth 2 (two) times daily between meals. 06/21/17  Yes Sheikh, Omair Latif, DO  guaiFENesin (MUCINEX) 600 MG 12 hr tablet Take 2 tablets (1,200 mg total) by mouth 2 (two) times daily. Patient taking differently: Take 1,200 mg by mouth daily.  06/21/17  Yes Sheikh, Omair Latif, DO  nicotine (NICODERM CQ - DOSED IN MG/24 HOURS) 21 mg/24hr patch Place 1 patch (21 mg total) onto the skin daily. 06/22/17  Yes Sheikh, Omair Latif, DO  Tiotropium Bromide Monohydrate (SPIRIVA RESPIMAT) 2.5 MCG/ACT AERS Inhale 2 puffs into the lungs daily. 06/22/17  Yes Tanda Rockers, MD  traMADol (ULTRAM) 50 MG tablet Take 50 mg by  mouth every 6 (six) hours as needed. for pain 05/15/17  Yes [provider]  Vitamin D, Ergocalciferol, (DRISDOL) 50000 units CAPS capsule Take 50,000 Units by mouth every 14 (fourteen) days.    Yes [provider]  Tiotropium Bromide Monohydrate (SPIRIVA RESPIMAT) 2.5 MCG/ACT AERS Inhale 2 puffs into the lungs daily. Patient not taking: Reported on 07/18/2017 06/22/17   Tanda Rockers, MD    Physical Exam: Vitals:   07/18/17 1600 07/18/17 1615 07/18/17 1630 07/18/17 1645  BP: (!) 163/89 (!) 153/99 (!) 149/93 (!) 144/90  Pulse: (!) 119 (!)  115 (!) 112 (!) 105  Resp: 20 (!) 26 (!) 23 (!) 26  Temp:      TempSrc:      SpO2: 98% 96% 91% 98%  Weight:      Height:        Constitutional: NAD, calm, comfortable, cachetic and thin  Eyes: PERRL, lids and conjunctivae normal ENMT: Mucous membranes are moist. Posterior pharynx clear of any exudate or lesions.Normal dentition.  Neck: normal, supple, no masses, no thyromegaly Respiratory: CTAB, no distress on room air, chest tube on left  Cardiovascular: tachycardic rate and regular rhythm, no murmurs / rubs / gallops. No extremity edema.  Abdomen: no tenderness, no masses palpated. No hepatosplenomegaly. Bowel sounds positive.  Musculoskeletal: no clubbing / cyanosis. No joint deformity upper and lower extremities. Good ROM, no contractures. Normal muscle tone.  Skin: no rashes, lesions, ulcers. No induration Neurologic: CN 2-12 grossly intact. Strength 5/5 in all 4.  Psychiatric: Normal judgment and insight. Alert and oriented x 3. Normal mood.   Labs on Admission: I have personally reviewed following labs and imaging studies  CBC: Recent Labs  Lab 07/18/17 1223  WBC 9.0  HGB 14.9  HCT 44.9  MCV 104.2*  PLT 130   Basic Metabolic Panel: Recent Labs  Lab 07/18/17 1223  NA 136  K 4.6  CL 99*  CO2 23  GLUCOSE 149*  BUN 8  CREATININE 0.56  CALCIUM 9.6   GFR: Estimated Creatinine Clearance: 47 mL/min (by C-G formula based on SCr of 0.56 mg/dL). Liver Function Tests: No results for input(s): AST, ALT, ALKPHOS, BILITOT, PROT, ALBUMIN in the last 168 hours. No results for input(s): LIPASE, AMYLASE in the last 168 hours. No results for input(s): AMMONIA in the last 168 hours. Coagulation Profile: No results for input(s): INR, PROTIME in the last 168 hours. Cardiac Enzymes: No results for input(s): CKTOTAL, CKMB, CKMBINDEX, TROPONINI in the last 168 hours. BNP (last 3 results) No results for input(s): PROBNP in the last 8760 hours. HbA1C: No results for input(s):  HGBA1C in the last 72 hours. CBG: No results for input(s): GLUCAP in the last 168 hours. Lipid Profile: No results for input(s): CHOL, HDL, LDLCALC, TRIG, CHOLHDL, LDLDIRECT in the last 72 hours. Thyroid Function Tests: No results for input(s): TSH, T4TOTAL, FREET4, T3FREE, THYROIDAB in the last 72 hours. Anemia Panel: No results for input(s): VITAMINB12, FOLATE, FERRITIN, TIBC, IRON, RETICCTPCT in the last 72 hours. Urine analysis: No results found for: COLORURINE, APPEARANCEUR, LABSPEC, PHURINE, GLUCOSEU, HGBUR, BILIRUBINUR, KETONESUR, PROTEINUR, UROBILINOGEN, NITRITE, LEUKOCYTESUR Sepsis Labs: !!!!!!!!!!!!!!!!!!!!!!!!!!!!!!!!!!!!!!!!!!!! @LABRCNTIP (procalcitonin:4,lacticidven:4) )No results found for this or any previous visit (from the past 240 hour(s)).   Radiological Exams on Admission: Dg Chest 2 View  Result Date: 07/18/2017 CLINICAL DATA:  Chest pain, shortness of breath. EXAM: CHEST  2 VIEW COMPARISON:  Radiograph June 21, 2016. FINDINGS: The heart size and mediastinal contours are within normal limits. Moderate size  left pneumothorax is noted on the order of approximately 20%. Right lung is clear. Hyperexpansion of the lungs is noted. No definite pleural effusion is noted. Nodular density is noted in the left midlung. The visualized skeletal structures are unremarkable. IMPRESSION: Moderate size left pneumothorax is noted. Critical Value/emergent results were called by telephone at the time of interpretation on 07/18/2017 at 12:56 pm to Dr. Marda Stalker , who verbally acknowledged these results. Nodular density is noted in left midlung; CT scan of the chest is recommended further evaluation. Hyperexpansion of the lungs is noted suggesting chronic obstructive pulmonary disease. Electronically Signed   By: Marijo Conception, M.D.   On: 07/18/2017 12:56   Dg Chest Portable 1 View  Result Date: 07/18/2017 CLINICAL DATA:  Chest tube placement EXAM: PORTABLE CHEST 1 VIEW COMPARISON:   July 18, 2017 FINDINGS: A chest tube is been placed on the left in the interval. The pigtail is located in the periphery of the left hemithorax. A tiny amount of air likely remains in the pleural space near the costophrenic angle. However, the moderate left-sided pneumothorax on the comparison study has otherwise resolved. The nodular density described in the left mid lung is partially obscured on this study by an EKG lead. No other interval changes. IMPRESSION: 1. Near complete resolution of the left-sided pneumothorax after placement of the chest tube. Of note, the pigtail of chest tube projects over the periphery of the left mid hemithorax. 2. The previously identified lung nodule in the mid left lung was better seen on the comparison study and is partially obscured by an EKG lead today. See the previous study for recommendations. Electronically Signed   By: Dorise Bullion III M.D   On: 07/18/2017 16:16    EKG: Independently reviewed. Sinus tachycardia   Assessment/Plan Principal Problem:   Recurrent spontaneous pneumothorax Active Problems:   COPD GOLD III    Anxiety   Pulmonary nodules   Tobacco abuse   COPD with emphysema (HCC)  Recurrent spontaneous left pneumothorax -Recently admitted 1/4-1/8 for left pneumothorax, managed with chest tube and evaluated by cardiothoracic surgery and PCCM. At that time, patient was not deemed candidate for VATS pleurodesis.  -Chest tube per PCCM -Cardiovascular surgery Dr. Servando Snare consulted by EDP  -Repeat CXR with near complete resolution left pneumothorax  Lung nodules -Followed by [name], pulmonology. At previous office visit, patient had declined biopsy    COPD  -Continue home inhalers   Depression/anxiety -Continue cymbalta, xanax  Tobacco abuse -Cessation counseling, nicotine patch daily    DVT prophylaxis: lovenox Code Status: full  Family Communication: no family at bedside Disposition Plan: admit inpatient to  telemetry Consults called: PCCM and Dr. Servando Snare consulted by EDP  Admission status: inpatient   Dessa Phi, DO [facility] [email or web address] Password [email or web address] 07/18/2017, 4:55 PM

## 2017-07-19 ENCOUNTER — Inpatient Hospital Stay (HOSPITAL_COMMUNITY): Payer: BC Managed Care – PPO

## 2017-07-19 DIAGNOSIS — J9383 Other pneumothorax: Principal | ICD-10-CM

## 2017-07-19 DIAGNOSIS — J9311 Primary spontaneous pneumothorax: Secondary | ICD-10-CM

## 2017-07-19 LAB — BASIC METABOLIC PANEL
ANION GAP: 11 (ref 5–15)
BUN: 7 mg/dL (ref 6–20)
CO2: 29 mmol/L (ref 22–32)
Calcium: 9.2 mg/dL (ref 8.9–10.3)
Chloride: 98 mmol/L — ABNORMAL LOW (ref 101–111)
Creatinine, Ser: 0.55 mg/dL (ref 0.44–1.00)
GFR calc Af Amer: >60 mL/min (ref >60)
Glucose, Bld: 102 mg/dL — ABNORMAL HIGH (ref 65–99)
POTASSIUM: 4.8 mmol/L (ref 3.5–5.1)
Sodium: 138 mmol/L (ref 135–145)

## 2017-07-19 LAB — CBC
HCT: 41.8 % (ref 36.0–46.0)
HEMOGLOBIN: 13.7 g/dL (ref 12.0–15.0)
MCH: 34.3 pg — ABNORMAL HIGH (ref 26.0–34.0)
MCHC: 32.8 g/dL (ref 30.0–36.0)
MCV: 104.5 fL — ABNORMAL HIGH (ref 78.0–100.0)
PLATELETS: 236 10*3/uL (ref 150–400)
RBC: 4 MIL/uL (ref 3.87–5.11)
RDW: 13.1 % (ref 11.5–15.5)
WBC: 8.8 10*3/uL (ref 4.0–10.5)

## 2017-07-19 MED ORDER — IPRATROPIUM BROMIDE 0.02 % IN SOLN
0.5000 mg | Freq: Three times a day (TID) | RESPIRATORY_TRACT | Status: DC
  Administered 2017-07-19 – 2017-07-23 (×12): 0.5 mg via RESPIRATORY_TRACT
  Filled 2017-07-19 (×13): qty 2.5

## 2017-07-19 MED ORDER — SALINE SPRAY 0.65 % NA SOLN
1.0000 | NASAL | Status: DC | PRN
  Filled 2017-07-19: qty 44

## 2017-07-19 NOTE — Progress Notes (Signed)
PROGRESS NOTE    Darlene [name]  HYQ:[id number] DOB: 1956/03/10 DOA: 07/18/2017 PCP: [name], MD     Brief Narrative:  Darlene [name] is a 62 y.o. female with medical history significant of COPD, tobacco abuse, who presents to the hospital with 2 day history of chest pain, back pain, shortness of breath. She was recently hospitalized from 1/4-1/8 with spontaneous left pneumothorax, treated with chest tube. During that admission, cardiothoracic surgery did not feel she was a candidate for VATS pleurodesis/blebectomy. She improved and was discharged home. She now returns with chest pain, back pain, and SOB with exertion since day prior to admission. In the ED, CXR revealed moderate left pneumothorax. PCCM was consulted. Chest tube placed.   Assessment & Plan:   Principal Problem:   Recurrent spontaneous pneumothorax Active Problems:   COPD GOLD III    Anxiety   Pulmonary nodules   Tobacco abuse   COPD with emphysema (HCC)  Recurrent spontaneous left pneumothorax -Recently admitted 1/4-1/8 for left pneumothorax, managed with chest tube and evaluated by cardiothoracic surgery and PCCM. At that time, patient was not deemed candidate for VATS pleurodesis.  -Chest tube management per PCCM -Cardiovascular surgery Dr. Servando Snare consulted by EDP  -Repeat CXR with resolution left pneumothorax  Lung nodules -Followed by [name], pulmonology. At previous office visit, patient had declined biopsy    COPD  -Continue home inhalers   Depression/anxiety -Continue cymbalta, xanax  Tobacco abuse -Cessation counseling, nicotine patch daily    DVT prophylaxis: Lovenox Code Status: Full Family Communication: No family at bedside Disposition Plan: Pending improvement, CVTS consultation   Consultants:   PCCM  CVTS Dr. Servando Snare   Procedures:   Chest tube placed in ED 2/4  Antimicrobials:  Anti-infectives (From admission, onward)   None        Subjective: Feeling  well, has some pleuritic chest pain.   Objective: Vitals:   07/19/17 0027 07/19/17 0057 07/19/17 0500 07/19/17 0825  BP: 112/84 127/81 (!) 145/83   Pulse: (!) 106 (!) 102 (!) 128   Resp: 17 18 (!) 22   Temp:   98.1 F (36.7 C)   TempSrc:   Oral   SpO2: 100% 99% 95% 98%  Weight:   38.6 kg (85 lb)   Height:        Intake/Output Summary (Last 24 hours) at 07/19/2017 1125 Last data filed at 07/19/2017 1000 Gross per 24 hour  Intake 3 ml  Output 600 ml  Net -597 ml   Filed Weights   07/18/17 1218 07/18/17 1856 07/19/17 0500  Weight: 40.8 kg (90 lb) 38.4 kg (84 lb 10.5 oz) 38.6 kg (85 lb)    Examination: General exam: Appears calm and comfortable, frail and thin  Respiratory system: Diminished but clear to auscultation. Respiratory effort normal. Cardiovascular system: S1 & S2 heard, tachycardic rate, regular rhythm. No JVD, murmurs, rubs, gallops or clicks. No pedal edema. Gastrointestinal system: Abdomen is nondistended, soft and nontender. No organomegaly or masses felt. Normal bowel sounds heard. Central nervous system: Alert and oriented. No focal neurological deficits. Extremities: Symmetric 5 x 5 power. Skin: No rashes, lesions or ulcers Psychiatry: Judgement and insight appear normal. Mood & affect appropriate.   Data Reviewed: I have personally reviewed following labs and imaging studies  CBC: Recent Labs  Lab 07/18/17 1223 07/19/17 0410  WBC 9.0 8.8  HGB 14.9 13.7  HCT 44.9 41.8  MCV 104.2* 104.5*  PLT 280 952   Basic Metabolic Panel: Recent Labs  Lab 07/18/17 1223 07/19/17 0410  NA 136 138  K 4.6 4.8  CL 99* 98*  CO2 23 29  GLUCOSE 149* 102*  BUN 8 7  CREATININE 0.56 0.55  CALCIUM 9.6 9.2   GFR: Estimated Creatinine Clearance: 44.4 mL/min (by C-G formula based on SCr of 0.55 mg/dL). Liver Function Tests: No results for input(s): AST, ALT, ALKPHOS, BILITOT, PROT, ALBUMIN in the last 168 hours. No results for input(s): LIPASE, AMYLASE in the last  168 hours. No results for input(s): AMMONIA in the last 168 hours. Coagulation Profile: No results for input(s): INR, PROTIME in the last 168 hours. Cardiac Enzymes: No results for input(s): CKTOTAL, CKMB, CKMBINDEX, TROPONINI in the last 168 hours. BNP (last 3 results) No results for input(s): PROBNP in the last 8760 hours. HbA1C: No results for input(s): HGBA1C in the last 72 hours. CBG: No results for input(s): GLUCAP in the last 168 hours. Lipid Profile: No results for input(s): CHOL, HDL, LDLCALC, TRIG, CHOLHDL, LDLDIRECT in the last 72 hours. Thyroid Function Tests: No results for input(s): TSH, T4TOTAL, FREET4, T3FREE, THYROIDAB in the last 72 hours. Anemia Panel: No results for input(s): VITAMINB12, FOLATE, FERRITIN, TIBC, IRON, RETICCTPCT in the last 72 hours. Sepsis Labs: No results for input(s): PROCALCITON, LATICACIDVEN in the last 168 hours.  No results found for this or any previous visit (from the past 240 hour(s)).     Radiology Studies: Dg Chest 2 View  Result Date: 07/18/2017 CLINICAL DATA:  Chest pain, shortness of breath. EXAM: CHEST  2 VIEW COMPARISON:  Radiograph June 21, 2016. FINDINGS: The heart size and mediastinal contours are within normal limits. Moderate size left pneumothorax is noted on the order of approximately 20%. Right lung is clear. Hyperexpansion of the lungs is noted. No definite pleural effusion is noted. Nodular density is noted in the left midlung. The visualized skeletal structures are unremarkable. IMPRESSION: Moderate size left pneumothorax is noted. Critical Value/emergent results were called by telephone at the time of interpretation on 07/18/2017 at 12:56 pm to Dr. Marda Stalker , who verbally acknowledged these results. Nodular density is noted in left midlung; CT scan of the chest is recommended further evaluation. Hyperexpansion of the lungs is noted suggesting chronic obstructive pulmonary disease. Electronically Signed   By: Marijo Conception, M.D.   On: 07/18/2017 12:56   Dg Chest Port 1 View  Result Date: 07/19/2017 CLINICAL DATA:  Left chest tube, shortness of Breath EXAM: PORTABLE CHEST 1 VIEW COMPARISON:  07/18/2017 FINDINGS: Left chest tube remains in place, unchanged. No pneumothorax. Heart is normal size. No focal airspace opacity or effusion. IMPRESSION: Left chest tube without pneumothorax.  No acute findings. Electronically Signed   By: Rolm Baptise M.D.   On: 07/19/2017 09:42   Dg Chest Portable 1 View  Result Date: 07/18/2017 CLINICAL DATA:  Chest tube placement EXAM: PORTABLE CHEST 1 VIEW COMPARISON:  July 18, 2017 FINDINGS: A chest tube is been placed on the left in the interval. The pigtail is located in the periphery of the left hemithorax. A tiny amount of air likely remains in the pleural space near the costophrenic angle. However, the moderate left-sided pneumothorax on the comparison study has otherwise resolved. The nodular density described in the left mid lung is partially obscured on this study by an EKG lead. No other interval changes. IMPRESSION: 1. Near complete resolution of the left-sided pneumothorax after placement of the chest tube. Of note, the pigtail of chest tube projects over the periphery of  the left mid hemithorax. 2. The previously identified lung nodule in the mid left lung was better seen on the comparison study and is partially obscured by an EKG lead today. See the previous study for recommendations. Electronically Signed   By: Dorise Bullion III M.D   On: 07/18/2017 16:16      Scheduled Meds: . ALPRAZolam  0.25 mg Oral Daily  . arformoterol  15 mcg Nebulization BID  . budesonide (PULMICORT) nebulizer solution  0.5 mg Nebulization BID  . DULoxetine  60 mg Oral Daily  . enoxaparin (LOVENOX) injection  30 mg Subcutaneous Q24H  . feeding supplement (ENSURE ENLIVE)  237 mL Oral BID BM  . ipratropium  0.5 mg Nebulization TID  . nicotine  21 mg Transdermal Daily  . sodium chloride  flush  3 mL Intravenous Q12H   Continuous Infusions: . sodium chloride       LOS: 1 day    Time spent: 30 minutes   Dessa Phi, DO [facility] [email or web address] Password [email or web address] 07/19/2017, 11:25 AM

## 2017-07-19 NOTE — Progress Notes (Addendum)
PULMONARY / CRITICAL CARE MEDICINE   Name: Darlene [name] MRN: [id number] DOB: 08-27-1955    ADMISSION DATE:  07/18/2017 CONSULTATION DATE:  07/18/17  REFERRING MD:  Dr. Wilson Singer   CHIEF COMPLAINT:  Chest Pain   HISTORY OF PRESENT ILLNESS:  62 y/o F with GOLD III COPD & ongoing smoking who presented to [facility] on 2/4 with complaints of ~ 24 hours of chest and back pain with associated shortness of breath.    She was recently admitted from 1/4-1/8 with a spontaneous pneumothorax.  A chest tube was placed per the EDP.  CVTS evaluated her at that time for possible VATS pleurodesis and it was felt at that time that she was not a candidate for surgery.  Dr. Chase Caller then recommended to reconsult CVTS to discuss VATS pleurodesis / blebectomy.  In the interim, PTX resolved and chest tube removed. She was discharged home with plans for follow up with Pulmonary.    The patient reports she began having pain in her back on 2/3 which progressed to her chest 2/4.  She states she continues to smoke. She reported pain with deep inspiration and splinted from taking a deep breath.  Has been taking mucinex for cough to loosen secretions.  On arrival, CXR demonstrated a moderate left pneumothorax.  Left small bore catheter was placed by the EDP on 2/4, placed to 20 cm suction.  Patient states she is reluctant to have any further procedures completed in regards to PTX.   CVTS called by ED who asked PCCM called for help with management of chest tube.  PAST MEDICAL HISTORY :  She  has a past medical history of Anxiety, COPD (chronic obstructive pulmonary disease) ([place]), Emphysema, and Substance abuse ([place]).  PAST SURGICAL HISTORY: She  has a past surgical history that includes Back surgery and Knee surgery.  Allergies  Allergen Reactions  . Codeine Nausea And Vomiting    No current facility-administered medications on file prior to encounter.    Current Outpatient Medications on File Prior to Encounter   Medication Sig  . acetaminophen (TYLENOL) 500 MG tablet Take 1,000 mg by mouth every 6 (six) hours as needed.  Marland Kitchen albuterol (PROAIR HFA) 108 (90 BASE) MCG/ACT inhaler Inhale 2 puffs into the lungs every 6 (six) hours as needed for wheezing or shortness of breath.  . ALPRAZolam (XANAX) 0.25 MG tablet Take 0.25 mg by mouth daily.   . Artificial Tear Ointment (DRY EYES OP) Place 1 drop into both eyes daily as needed.  . budesonide-formoterol (SYMBICORT) 160-4.5 MCG/ACT inhaler Inhale 2 puffs into the lungs 2 (two) times daily.  . Cholecalciferol (VITAMIN D) 2000 units tablet Take 6,000 Units by mouth daily.  . DULoxetine (CYMBALTA) 60 MG capsule Take 60 mg by mouth daily.  . feeding supplement, ENSURE ENLIVE, (ENSURE ENLIVE) LIQD Take 237 mLs by mouth 2 (two) times daily between meals.  Marland Kitchen guaiFENesin (MUCINEX) 600 MG 12 hr tablet Take 2 tablets (1,200 mg total) by mouth 2 (two) times daily. (Patient taking differently: Take 1,200 mg by mouth daily. )  . nicotine (NICODERM CQ - DOSED IN MG/24 HOURS) 21 mg/24hr patch Place 1 patch (21 mg total) onto the skin daily.  . Tiotropium Bromide Monohydrate (SPIRIVA RESPIMAT) 2.5 MCG/ACT AERS Inhale 2 puffs into the lungs daily.  . traMADol (ULTRAM) 50 MG tablet Take 50 mg by mouth every 6 (six) hours as needed. for pain  . Vitamin D, Ergocalciferol, (DRISDOL) 50000 units CAPS capsule Take 50,000 Units by mouth  every 14 (fourteen) days.     FAMILY HISTORY:  Her indicated that her mother is alive. She indicated that her father is deceased. She indicated that her paternal aunt is alive.   SOCIAL HISTORY: She  reports that she quit smoking about 4 weeks ago. Her smoking use included cigarettes. She has a 63.00 pack-year smoking history. she has never used smokeless tobacco. She reports that she drinks about 3.0 oz of alcohol per week. She reports that she does not use drugs.  REVIEW OF SYSTEMS:  All negative; except for those that are bolded, which indicate  positives.  Constitutional: weight loss, weight gain, night sweats, fevers, chills, fatigue, weakness.  HEENT: headaches, sore throat, sneezing, nasal congestion, post nasal drip, difficulty swallowing, tooth/dental problems, visual complaints, visual changes, ear aches. Neuro: difficulty with speech, weakness, numbness, ataxia. CV:  chest pain, orthopnea, PND, swelling in lower extremities, dizziness, palpitations, syncope, pleuritic chest pain. Resp: cough, hemoptysis, dyspnea, wheezing. GI: heartburn, indigestion, abdominal pain, nausea, vomiting, diarrhea, constipation, change in bowel habits, loss of appetite, hematemesis, melena, hematochezia.  GU: dysuria, change in color of urine, urgency or frequency, flank pain, hematuria. MSK: joint pain or swelling, decreased range of motion, left chest pain at site of chest tube Psych: change in mood or affect, depression, anxiety, suicidal ideations, homicidal ideations. Skin: rash, itching, bruising.  SUBJECTIVE:  Feels that pain at CT site is better. No c/o dyspnea  VITAL SIGNS: BP (!) 145/83 (BP Location: Right Arm)   Pulse (!) 128   Temp 98.1 F (36.7 C) (Oral)   Resp (!) 22   Ht 5\' 4"  (1.626 m)   Wt 85 lb (38.6 kg)   SpO2 98%   BMI 14.59 kg/m   HEMODYNAMICS:    VENTILATOR SETTINGS:    INTAKE / OUTPUT: I/O last 3 completed shifts: In: 3 [I.V.:3] Out: 300 [Urine:300]  PHYSICAL EXAMINATION: Gen:      No acute distress, frail HEENT:  EOMI, sclera anicteric Neck:     No masses; no thyromegaly Lungs:    Clear to auscultation bilaterally; normal respiratory effort CV:         Regular rate and rhythm; no murmurs Abd:      + bowel sounds; soft, non-tender; no palpable masses, no distension Ext:    No edema; adequate peripheral perfusion Skin:      Warm and dry; no rash Neuro: alert and oriented x 3 Psych: normal mood and affect  LABS:  BMET Recent Labs  Lab 07/18/17 1223 07/19/17 0410  NA 136 138  K 4.6 4.8  CL 99*  98*  CO2 23 29  BUN 8 7  CREATININE 0.56 0.55  GLUCOSE 149* 102*    Electrolytes Recent Labs  Lab 07/18/17 1223 07/19/17 0410  CALCIUM 9.6 9.2    CBC Recent Labs  Lab 07/18/17 1223 07/19/17 0410  WBC 9.0 8.8  HGB 14.9 13.7  HCT 44.9 41.8  PLT 280 236    Coag's No results for input(s): APTT, INR in the last 168 hours.  Sepsis Markers No results for input(s): LATICACIDVEN, PROCALCITON, O2SATVEN in the last 168 hours.  ABG No results for input(s): PHART, PCO2ART, PO2ART in the last 168 hours.  Liver Enzymes No results for input(s): AST, ALT, ALKPHOS, BILITOT, ALBUMIN in the last 168 hours.  Cardiac Enzymes No results for input(s): TROPONINI, PROBNP in the last 168 hours.  Glucose No results for input(s): GLUCAP in the last 168 hours.  Imaging Dg Chest 2 View  Result  Date: 07/18/2017 CLINICAL DATA:  Chest pain, shortness of breath. EXAM: CHEST  2 VIEW COMPARISON:  Radiograph June 21, 2016. FINDINGS: The heart size and mediastinal contours are within normal limits. Moderate size left pneumothorax is noted on the order of approximately 20%. Right lung is clear. Hyperexpansion of the lungs is noted. No definite pleural effusion is noted. Nodular density is noted in the left midlung. The visualized skeletal structures are unremarkable. IMPRESSION: Moderate size left pneumothorax is noted. Critical Value/emergent results were called by telephone at the time of interpretation on 07/18/2017 at 12:56 pm to Dr. Marda Stalker , who verbally acknowledged these results. Nodular density is noted in left midlung; CT scan of the chest is recommended further evaluation. Hyperexpansion of the lungs is noted suggesting chronic obstructive pulmonary disease. Electronically Signed   By: Marijo Conception, M.D.   On: 07/18/2017 12:56   Dg Chest Port 1 View  Result Date: 07/19/2017 CLINICAL DATA:  Left chest tube, shortness of Breath EXAM: PORTABLE CHEST 1 VIEW COMPARISON:  07/18/2017  FINDINGS: Left chest tube remains in place, unchanged. No pneumothorax. Heart is normal size. No focal airspace opacity or effusion. IMPRESSION: Left chest tube without pneumothorax.  No acute findings. Electronically Signed   By: Rolm Baptise M.D.   On: 07/19/2017 09:42   Dg Chest Portable 1 View  Result Date: 07/18/2017 CLINICAL DATA:  Chest tube placement EXAM: PORTABLE CHEST 1 VIEW COMPARISON:  July 18, 2017 FINDINGS: A chest tube is been placed on the left in the interval. The pigtail is located in the periphery of the left hemithorax. A tiny amount of air likely remains in the pleural space near the costophrenic angle. However, the moderate left-sided pneumothorax on the comparison study has otherwise resolved. The nodular density described in the left mid lung is partially obscured on this study by an EKG lead. No other interval changes. IMPRESSION: 1. Near complete resolution of the left-sided pneumothorax after placement of the chest tube. Of note, the pigtail of chest tube projects over the periphery of the left mid hemithorax. 2. The previously identified lung nodule in the mid left lung was better seen on the comparison study and is partially obscured by an EKG lead today. See the previous study for recommendations. Electronically Signed   By: Dorise Bullion III M.D   On: 07/18/2017 16:16     STUDIES:  CXR 2/4 >> moderate left pneumothorax  CXR 2/5 >> Resolved pneumothorax, CT in place. I have reviewed the images personally  CULTURES: None.  ANTIBIOTICS: None.  SIGNIFICANT EVENTS: 2/04  Admit with chest / back pain, found to have recurrent pneumothorax, small bore chest tube placed.  LINES/TUBES: Left sided pigtail chest tube 2/4 >   DISCUSSION: 61 year old with advanced COPD, active smoker admitted with recurrent left-sided pneumothorax PCCM asked to consult for chest tube management.  ASSESSMENT / PLAN:  Recurrent pneumothorax No air leak noted on chest tube.  Will  place to waterseal Follow-up chest x-ray today afternoon Keep chest tube in place until she can be evaluated by cardiothoracic surgery for pleurodesis.  COPD, no sign of acute exacerbation Continue bronchodilators Tobacco cessation  Abnormal CT, right upper lobe cavitary lesion, pulmonary nodules Patient has refused biopsy. Follow-up in clinic with [name].  Consider PET scan as an outpatient.  Marshell Garfinkel MD [facility] Pager [phone] If no answer or after 3pm call: [phone] 07/19/2017, 12:17 PM

## 2017-07-19 NOTE — Plan of Care (Signed)
Pt oriented to unit, plan of care, and method of reporting concerns. Verbalizes understanding of need to call for assistance prior to ambulation. Call light and bedside table within reach 

## 2017-07-19 NOTE — Consult Note (Signed)
[address]       [place] [address]             [phone]        Darlene [name] [facility] Medical Record #[id number] Date of Birth: 08-12-1955  Referring: Dr. Chase Caller Primary Care: [name], MD Primary Cardiologist:No primary care provider on file.  Chief Complaint:    Chief Complaint  Patient presents with  . Chest Pain  . Shortness of Breath  . Back Pain    History of Present Illness:     Darlene [name] is a 62 year old female  with a past medical history significant for COPD, tobacco abuse, anxiety, and  spontaneous pneumothorax.  She was hospitalized January 4 - January 8 with a spontaneous spontaneous left pneumothorax and treated with a chest tube.  During that admission we saw the patient and did not feel it was appropriate for surgery.  CT scan at that time showed enlarging cavitary right lung lesion .  She was discharged with follow-up with pulmonary to further evaluate her lung nodules .  PET scan was recommended but has not been done .  Patient came to the emergency room yesterday follow-up chest x-ray in the emergency department  revealed a moderate left pneumothorax.  A pigtail catheter was placed by the emergency room physician. Recent chest xray showed a left chest tube in place without pneumothorax. No acute findings.  The patient has severe diffuse emphysematous disease in both lungs with enlarging cavitary right lung lesion confirmed on serial CT scans done by the pulmonary service over the past year.   The patient says today she does not wish to have the lung nodules evaluated   Current Activity/ Functional Status: Patient was independent with mobility/ambulation, transfers, ADL's, IADL's.   Zubrod Score: At the time of surgery this patient's most appropriate activity status/level should be described as: []     0    Normal activity, no symptoms [x]     1    Restricted in physical strenuous activity but ambulatory, able to  do out light work []     2    Ambulatory and capable of self care, unable to do work activities, up and about                 more than 50%  Of the time                            []     3    Only limited self care, in bed greater than 50% of waking hours []     4    Completely disabled, no self care, confined to bed or chair []     5    Moribund  Past Medical History:  Diagnosis Date  . Anxiety   . COPD (chronic obstructive pulmonary disease) ([place])   . Emphysema   . Substance abuse ([place])    tobacco dependance    Past Surgical History:  Procedure Laterality Date  . BACK SURGERY    . KNEE SURGERY      Social History   Tobacco Use  Smoking Status Former Smoker  . Packs/day: 1.50  . Years: 42.00  . Pack years: 63.00  . Types: Cigarettes  . Last attempt to quit: 06/16/2017  . Years since quitting: 0.0  Smokeless Tobacco Never Used  Tobacco Comment   will attend cessation class at [facility]  Social History   Substance and Sexual Activity  Alcohol Use Yes  . Alcohol/week: 3.0 oz  . Types: 5 Cans of beer per week   Comment: 3-5 cans three to four timesa week    Social History   Socioeconomic History  . Marital status: Widowed    Spouse name: Not on file  . Number of children: 2  . Years of education: Not on file  . Highest education level: Not on file  Social Needs  . Financial resource strain: Not on file  . Food insecurity - worry: Not on file  . Food insecurity - inability: Not on file  . Transportation needs - medical: Not on file  . Transportation needs - non-medical: Not on file  Occupational History  . Occupation: Audiological scientist: [employer]  Tobacco Use  . Smoking status: Former Smoker    Packs/day: 1.50    Years: 42.00    Pack years: 63.00    Types: Cigarettes    Last attempt to quit: 06/16/2017    Years since quitting: 0.0  . Smokeless tobacco: Never Used  . Tobacco comment: will attend cessation class at [facility]  Substance and Sexual  Activity  . Alcohol use: Yes    Alcohol/week: 3.0 oz    Types: 5 Cans of beer per week    Comment: 3-5 cans three to four timesa week  . Drug use: No  . Sexual activity: Not on file  Other Topics Concern  . Not on file  Social History Narrative  . Not on file    Allergies  Allergen Reactions  . Codeine Nausea And Vomiting    Current Facility-Administered Medications  Medication Dose Route Frequency Provider Last Rate Last Dose  . 0.9 %  sodium chloride infusion  250 mL Intravenous PRN Dessa Phi, DO      . acetaminophen (TYLENOL) tablet 650 mg  650 mg Oral Q6H PRN Dessa Phi, DO   650 mg at 07/19/17 0411   Or  . acetaminophen (TYLENOL) suppository 650 mg  650 mg Rectal Q6H PRN Dessa Phi, DO      . albuterol (PROVENTIL) (2.5 MG/3ML) 0.083% nebulizer solution 2.5 mg  2.5 mg Nebulization Q3H PRN Desai, Rahul P, PA-C      . ALPRAZolam Duanne Moron) tablet 0.25 mg  0.25 mg Oral Daily Dessa Phi, DO   0.25 mg at 07/19/17 1018  . arformoterol (BROVANA) nebulizer solution 15 mcg  15 mcg Nebulization BID Shearon Stalls, Rahul P, PA-C   15 mcg at 07/19/17 4270  . budesonide (PULMICORT) nebulizer solution 0.5 mg  0.5 mg Nebulization BID Shearon Stalls, Rahul P, PA-C   0.5 mg at 07/19/17 0831  . DULoxetine (CYMBALTA) DR capsule 60 mg  60 mg Oral Daily Dessa Phi, DO   60 mg at 07/19/17 1018  . enoxaparin (LOVENOX) injection 30 mg  30 mg Subcutaneous Q24H Dessa Phi, DO      . feeding supplement (ENSURE ENLIVE) (ENSURE ENLIVE) liquid 237 mL  237 mL Oral BID BM Dessa Phi, DO      . ipratropium (ATROVENT) nebulizer solution 0.5 mg  0.5 mg Nebulization TID Dessa Phi, DO   0.5 mg at 07/19/17 0826  . nicotine (NICODERM CQ - dosed in mg/24 hours) patch 21 mg  21 mg Transdermal Daily Dessa Phi, DO   21 mg at 07/19/17 1018  . ondansetron (ZOFRAN) tablet 4 mg  4 mg Oral Q6H PRN Dessa Phi, DO  Or  . ondansetron (ZOFRAN) injection 4 mg  4 mg Intravenous Q6H PRN Dessa Phi, DO       . senna-docusate (Senokot-S) tablet 1 tablet  1 tablet Oral QHS PRN Dessa Phi, DO      . sodium chloride flush (NS) 0.9 % injection 3 mL  3 mL Intravenous Q12H Dessa Phi, DO   3 mL at 07/19/17 1020  . sodium chloride flush (NS) 0.9 % injection 3 mL  3 mL Intravenous PRN Dessa Phi, DO      . traMADol Veatrice Bourbon) tablet 50 mg  50 mg Oral Q6H PRN Gardiner Barefoot, NP   50 mg at 07/19/17 1017  . zolpidem (AMBIEN) tablet 5 mg  5 mg Oral QHS PRN Dessa Phi, DO        Medications Prior to Admission  Medication Sig Dispense Refill Last Dose  . acetaminophen (TYLENOL) 500 MG tablet Take 1,000 mg by mouth every 6 (six) hours as needed.   07/18/2017 at Unknown time  . albuterol (PROAIR HFA) 108 (90 BASE) MCG/ACT inhaler Inhale 2 puffs into the lungs every 6 (six) hours as needed for wheezing or shortness of breath. 3 each 0 07/18/2017 at prn  . ALPRAZolam (XANAX) 0.25 MG tablet Take 0.25 mg by mouth daily.    07/18/2017 at prn  . Artificial Tear Ointment (DRY EYES OP) Place 1 drop into both eyes daily as needed.   07/18/2017 at Unknown time  . budesonide-formoterol (SYMBICORT) 160-4.5 MCG/ACT inhaler Inhale 2 puffs into the lungs 2 (two) times daily. 1 Inhaler 6 07/18/2017 at Unknown time  . Cholecalciferol (VITAMIN D) 2000 units tablet Take 6,000 Units by mouth daily.   07/18/2017 at Unknown time  . DULoxetine (CYMBALTA) 60 MG capsule Take 60 mg by mouth daily.   07/18/2017 at Unknown time  . feeding supplement, ENSURE ENLIVE, (ENSURE ENLIVE) LIQD Take 237 mLs by mouth 2 (two) times daily between meals. 237 mL 12 07/15/2017  . guaiFENesin (MUCINEX) 600 MG 12 hr tablet Take 2 tablets (1,200 mg total) by mouth 2 (two) times daily. (Patient taking differently: Take 1,200 mg by mouth daily. ) 20 tablet 0 07/18/2017 at Unknown time  . nicotine (NICODERM CQ - DOSED IN MG/24 HOURS) 21 mg/24hr patch Place 1 patch (21 mg total) onto the skin daily. 28 patch 0 Past Month at Unknown time  . Tiotropium Bromide  Monohydrate (SPIRIVA RESPIMAT) 2.5 MCG/ACT AERS Inhale 2 puffs into the lungs daily. 1 Inhaler 0 07/18/2017 at Unknown time  . traMADol (ULTRAM) 50 MG tablet Take 50 mg by mouth every 6 (six) hours as needed. for pain  0 07/15/2017 at prn  . Vitamin D, Ergocalciferol, (DRISDOL) 50000 units CAPS capsule Take 50,000 Units by mouth every 14 (fourteen) days.    Past Week at Unknown time    Family History  Problem Relation Age of Onset  . Alzheimer's disease Mother   . Cancer Paternal Aunt        colon     Review of Systems:   Review of Systems  HENT: Negative.   Respiratory: Positive for cough, sputum production, shortness of breath and wheezing.   Cardiovascular: Positive for chest pain and palpitations.  Gastrointestinal: Negative.   Musculoskeletal: Positive for back pain.  Psychiatric/Behavioral: The patient is nervous/anxious.     Physical Exam: BP (!) 145/83 (BP Location: Right Arm)   Pulse (!) 128   Temp 98.1 F (36.7 C) (Oral)   Resp (!) 22   Ht 5'  4" (1.626 m)   Wt 85 lb (38.6 kg)   SpO2 98%   BMI 14.59 kg/m    General appearance: alert, cooperative and no distress Resp: clear to auscultation bilaterally and wheezing in all fields Cardio: sinus tachycardia GI: soft, non-tender; bowel sounds normal; no masses,  no organomegaly Extremities: extremities normal, atraumatic, no cyanosis or edema  Diagnostic Studies & Laboratory data:     Recent Radiology Findings:   Dg Chest 2 View  Result Date: 07/18/2017 CLINICAL DATA:  Chest pain, shortness of breath. EXAM: CHEST  2 VIEW COMPARISON:  Radiograph June 21, 2016. FINDINGS: The heart size and mediastinal contours are within normal limits. Moderate size left pneumothorax is noted on the order of approximately 20%. Right lung is clear. Hyperexpansion of the lungs is noted. No definite pleural effusion is noted. Nodular density is noted in the left midlung. The visualized skeletal structures are unremarkable. IMPRESSION:  Moderate size left pneumothorax is noted. Critical Value/emergent results were called by telephone at the time of interpretation on 07/18/2017 at 12:56 pm to Dr. Marda Stalker , who verbally acknowledged these results. Nodular density is noted in left midlung; CT scan of the chest is recommended further evaluation. Hyperexpansion of the lungs is noted suggesting chronic obstructive pulmonary disease. Electronically Signed   By: Marijo Conception, M.D.   On: 07/18/2017 12:56   Dg Chest Port 1 View  Result Date: 07/19/2017 CLINICAL DATA:  Left chest tube, shortness of Breath EXAM: PORTABLE CHEST 1 VIEW COMPARISON:  07/18/2017 FINDINGS: Left chest tube remains in place, unchanged. No pneumothorax. Heart is normal size. No focal airspace opacity or effusion. IMPRESSION: Left chest tube without pneumothorax.  No acute findings. Electronically Signed   By: Rolm Baptise M.D.   On: 07/19/2017 09:42   Dg Chest Portable 1 View  Result Date: 07/18/2017 CLINICAL DATA:  Chest tube placement EXAM: PORTABLE CHEST 1 VIEW COMPARISON:  July 18, 2017 FINDINGS: A chest tube is been placed on the left in the interval. The pigtail is located in the periphery of the left hemithorax. A tiny amount of air likely remains in the pleural space near the costophrenic angle. However, the moderate left-sided pneumothorax on the comparison study has otherwise resolved. The nodular density described in the left mid lung is partially obscured on this study by an EKG lead. No other interval changes. IMPRESSION: 1. Near complete resolution of the left-sided pneumothorax after placement of the chest tube. Of note, the pigtail of chest tube projects over the periphery of the left mid hemithorax. 2. The previously identified lung nodule in the mid left lung was better seen on the comparison study and is partially obscured by an EKG lead today. See the previous study for recommendations. Electronically Signed   By: Dorise Bullion III M.D   On:  07/18/2017 16:16     I have independently reviewed the above radiologic studies.  Recent Lab Findings: Lab Results  Component Value Date   WBC 8.8 07/19/2017   HGB 13.7 07/19/2017   HCT 41.8 07/19/2017   PLT 236 07/19/2017   GLUCOSE 102 (H) 07/19/2017   ALT 11 (L) 06/21/2017   AST 15 06/21/2017   NA 138 07/19/2017   K 4.8 07/19/2017   CL 98 (L) 07/19/2017   CREATININE 0.55 07/19/2017   BUN 7 07/19/2017   CO2 29 07/19/2017      Assessment / Plan:   Patient with enlarging right lung nodule cavitary over the past year. 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 I would not recommend any surgical intervention, could consider  chemical pleurodesis with talc Further work up/follow up  of pulmonary nodules per Pulmonary Service.  Grace Isaac MD      [address] [place] [address] Office [phone]   Ruth

## 2017-07-19 NOTE — Plan of Care (Signed)
  Progressing Clinical Measurements: Will remain free from infection 07/19/2017 0145 - Progressing by Colonel Bald, RN Note No s/s of infection at this time. Coping: Level of anxiety will decrease 07/19/2017 0145 - Progressing by Colonel Bald, RN Note No s/s of anxiety at this time.

## 2017-07-20 ENCOUNTER — Inpatient Hospital Stay (HOSPITAL_COMMUNITY): Payer: BC Managed Care – PPO

## 2017-07-20 DIAGNOSIS — J439 Emphysema, unspecified: Secondary | ICD-10-CM

## 2017-07-20 DIAGNOSIS — J449 Chronic obstructive pulmonary disease, unspecified: Secondary | ICD-10-CM

## 2017-07-20 DIAGNOSIS — R918 Other nonspecific abnormal finding of lung field: Secondary | ICD-10-CM

## 2017-07-20 LAB — BASIC METABOLIC PANEL
Anion gap: 11 (ref 5–15)
BUN: 9 mg/dL (ref 6–20)
CO2: 27 mmol/L (ref 22–32)
CREATININE: 0.54 mg/dL (ref 0.44–1.00)
Calcium: 9 mg/dL (ref 8.9–10.3)
Chloride: 99 mmol/L — ABNORMAL LOW (ref 101–111)
GFR calc Af Amer: >60 mL/min (ref >60)
GLUCOSE: 112 mg/dL — AB (ref 65–99)
POTASSIUM: 4.1 mmol/L (ref 3.5–5.1)
Sodium: 137 mmol/L (ref 135–145)

## 2017-07-20 LAB — CBC
HCT: 39.9 % (ref 36.0–46.0)
Hemoglobin: 12.9 g/dL (ref 12.0–15.0)
MCH: 33.9 pg (ref 26.0–34.0)
MCHC: 32.3 g/dL (ref 30.0–36.0)
MCV: 104.7 fL — AB (ref 78.0–100.0)
PLATELETS: 227 10*3/uL (ref 150–400)
RBC: 3.81 MIL/uL — AB (ref 3.87–5.11)
RDW: 12.8 % (ref 11.5–15.5)
WBC: 8 10*3/uL (ref 4.0–10.5)

## 2017-07-20 MED ORDER — KETOROLAC TROMETHAMINE 15 MG/ML IJ SOLN
15.0000 mg | Freq: Three times a day (TID) | INTRAMUSCULAR | Status: DC | PRN
  Administered 2017-07-20 – 2017-07-21 (×4): 15 mg via INTRAVENOUS
  Filled 2017-07-20 (×4): qty 1

## 2017-07-20 NOTE — Progress Notes (Addendum)
PROGRESS NOTE    Darlene [name]  XIP:[id number] DOB: 1955-07-06 DOA: 07/18/2017 PCP: [name], MD     Brief Narrative:  Darlene [name] is a 62 y.o. female with medical history significant of COPD, tobacco abuse, who presents to the hospital with 2 day history of chest pain, back pain, shortness of breath. She was recently hospitalized from 1/4-1/8 with spontaneous left pneumothorax, treated with chest tube. During that admission, cardiothoracic surgery did not feel she was a candidate for VATS pleurodesis/blebectomy. She improved and was discharged home. She now returns with chest pain, back pain, and SOB with exertion since day prior to admission. In the ED, CXR revealed moderate left pneumothorax. PCCM was consulted. Chest tube placed.   Assessment & Plan:   Recurrent spontaneous left pneumothorax -Recently admitted 1/4-1/8 for left pneumothorax, managed with chest tube and evaluated by cardiothoracic surgery and PCCM. At that time, patient was not deemed candidate for VATS pleurodesis.  -Repeat chest x-ray today with small pneumothorax -recurrent, Chest tube placed back to suction, continue management per PCCM -Cardiovascular surgery Dr. 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 with lobar pulmonary and declined biopsy at prior visit, importance of biopsy/PET scan reiterated with patient  COPD  -Continue home inhalers   Depression/anxiety -Continue cymbalta, xanax  Tobacco abuse -Cessation counseling, nicotine patch daily   Severe malnutrition -Supplements advised  DVT prophylaxis: Lovenox Code Status: Full Family Communication: No family at bedside Disposition Plan: Pending improvement,  pleurodesis   Consultants:   PCCM  CVTS Dr. Servando Snare   Procedures:   Chest tube placed in ED 2/4  Antimicrobials:  Anti-infectives (From admission, onward)   None       Subjective: -Having increased pain overnight and the left upper lung/apex  Objective: Vitals:   07/19/17 2036 07/20/17 0530 07/20/17 0842 07/20/17 1508  BP:  124/89  139/87  Pulse:  (!) 102  (!) 109  Resp:  20  17  Temp:  97.8 F (36.6 C)  98.1 F (36.7 C)  TempSrc:  Oral  Oral  SpO2: 97% 98% 95% 95%  Weight:  39.5 kg (87 lb)    Height:        Intake/Output Summary (Last 24 hours) at 07/20/2017 1534 Last data filed at 07/20/2017 0921 Gross per 24 hour  Intake 1080 ml  Output 12 ml  Net 1068 ml   Filed Weights   07/18/17 1856 07/19/17 0500 07/20/17 0530  Weight: 38.4 kg (84 lb 10.5 oz) 38.6 kg (85 lb) 39.5 kg (87 lb)    Examination: Gen: Awake, Alert, Oriented X 3, extremely cachectic female HEENT: PERRLA, Neck supple, no JVD Lungs: Poor air movement, no wheezes noted CVS: RRR,No Gallops,Rubs or new Murmurs Abd: soft, Non tender, non distended, BS present Extremities: No Cyanosis, Clubbing or edema Skin: no new rashes  Data Reviewed: I have personally reviewed following labs and imaging studies  CBC: Recent Labs  Lab 07/18/17 1223 07/19/17 0410 07/20/17 0442  WBC 9.0 8.8 8.0  HGB 14.9 13.7 12.9  HCT 44.9 41.8 39.9  MCV 104.2* 104.5* 104.7*  PLT 280 236 673   Basic Metabolic Panel: Recent Labs  Lab 07/18/17 1223 07/19/17 0410 07/20/17 0442  NA 136 138 137  K 4.6 4.8  4.1  CL 99* 98* 99*  CO2 23 29 27   GLUCOSE 149* 102* 112*  BUN 8 7 9   CREATININE 0.56 0.55 0.54  CALCIUM 9.6 9.2 9.0   GFR: Estimated Creatinine Clearance: 45.5 mL/min (by C-G formula based on SCr of 0.54 mg/dL). Liver Function Tests: No results for input(s): AST, ALT, ALKPHOS, BILITOT, PROT, ALBUMIN in the last 168 hours. No results for input(s): LIPASE, AMYLASE in the last 168 hours. No results for  input(s): AMMONIA in the last 168 hours. Coagulation Profile: No results for input(s): INR, PROTIME in the last 168 hours. Cardiac Enzymes: No results for input(s): CKTOTAL, CKMB, CKMBINDEX, TROPONINI in the last 168 hours. BNP (last 3 results) No results for input(s): PROBNP in the last 8760 hours. HbA1C: No results for input(s): HGBA1C in the last 72 hours. CBG: No results for input(s): GLUCAP in the last 168 hours. Lipid Profile: No results for input(s): CHOL, HDL, LDLCALC, TRIG, CHOLHDL, LDLDIRECT in the last 72 hours. Thyroid Function Tests: No results for input(s): TSH, T4TOTAL, FREET4, T3FREE, THYROIDAB in the last 72 hours. Anemia Panel: No results for input(s): VITAMINB12, FOLATE, FERRITIN, TIBC, IRON, RETICCTPCT in the last 72 hours. Sepsis Labs: No results for input(s): PROCALCITON, LATICACIDVEN in the last 168 hours.  No results found for this or any previous visit (from the past 240 hour(s)).     Radiology Studies: Dg Chest Port 1 View  Result Date: 07/20/2017 CLINICAL DATA:  Left chest tube EXAM: PORTABLE CHEST 1 VIEW COMPARISON:  07/19/2017 FINDINGS: Left chest tube remains in place. Very small left basilar pneumothorax noted. Scarring in the apices. Heart is normal size. No acute bony abnormality. IMPRESSION: Left chest tube remains in stable position. Small left basilar pneumothorax, similar to prior study. Biapical scarring. Electronically Signed   By: Rolm Baptise M.D.   On: 07/20/2017 09:52   Dg Chest Port 1 View  Result Date: 07/19/2017 CLINICAL DATA:  Pneumothorax. EXAM: PORTABLE CHEST 1 VIEW COMPARISON:  Radiograph of same day. FINDINGS: The heart size and mediastinal contours are within normal limits. Atherosclerosis of thoracic aorta is noted. Right lung is clear. Left-sided chest tube is unchanged in position. Minimal left apical pneumothorax is noted. The visualized skeletal structures are unremarkable. IMPRESSION: Left-sided chest tube is unchanged in  position. Minimal left apical pneumothorax is noted. Electronically Signed   By: Marijo Conception, M.D.   On: 07/19/2017 18:22   Dg Chest Port 1 View  Result Date: 07/19/2017 CLINICAL DATA:  Left chest tube, shortness of Breath EXAM: PORTABLE CHEST 1 VIEW COMPARISON:  07/18/2017 FINDINGS: Left chest tube remains in place, unchanged. No pneumothorax. Heart is normal size. No focal airspace opacity or effusion. IMPRESSION: Left chest tube without pneumothorax.  No acute findings. Electronically Signed   By: Rolm Baptise M.D.   On: 07/19/2017 09:42   Dg Chest Portable 1 View  Result Date: 07/18/2017 CLINICAL DATA:  Chest tube placement EXAM: PORTABLE CHEST 1 VIEW COMPARISON:  July 18, 2017 FINDINGS: A chest tube is been placed on the left in the interval. The pigtail is located in the periphery of the left hemithorax. A tiny amount of air likely remains in the pleural space near the costophrenic angle. However, the moderate left-sided pneumothorax on the comparison study has otherwise resolved. The nodular density described in the left mid lung is partially obscured on this study by an EKG lead. No other interval changes. IMPRESSION: 1. Near complete resolution of the left-sided pneumothorax after placement of the chest  tube. Of note, the pigtail of chest tube projects over the periphery of the left mid hemithorax. 2. The previously identified lung nodule in the mid left lung was better seen on the comparison study and is partially obscured by an EKG lead today. See the previous study for recommendations. Electronically Signed   By: Dorise Bullion III M.D   On: 07/18/2017 16:16      Scheduled Meds: . ALPRAZolam  0.25 mg Oral Daily  . arformoterol  15 mcg Nebulization BID  . budesonide (PULMICORT) nebulizer solution  0.5 mg Nebulization BID  . DULoxetine  60 mg Oral Daily  . enoxaparin (LOVENOX) injection  30 mg Subcutaneous Q24H  . feeding supplement (ENSURE ENLIVE)  237 mL Oral BID BM  .  ipratropium  0.5 mg Nebulization TID  . nicotine  21 mg Transdermal Daily  . sodium chloride flush  3 mL Intravenous Q12H   Continuous Infusions: . sodium chloride       LOS: 2 days    Time spent: 35 minutes   Domenic Polite, MD [facility] [email or web address] Password [facility] 07/20/2017, 3:34 PM

## 2017-07-20 NOTE — Progress Notes (Addendum)
      [address]       [place] [address]             [phone]             Subjective: Having a lot of new pain at the chest tube site. Chest tube placed back on suction.   Objective: Vital signs in last 24 hours: Temp:  [97.8 F (36.6 C)-99.2 F (37.3 C)] 97.8 F (36.6 C) (02/06 0530) Pulse Rate:  [102-114] 102 (02/06 0530) Cardiac Rhythm: Sinus tachycardia (02/06 0807) Resp:  [16-20] 20 (02/06 0530) BP: (123-126)/(85-89) 124/89 (02/06 0530) SpO2:  [94 %-98 %] 95 % (02/06 0842) Weight:  [87 lb (39.5 kg)] 87 lb (39.5 kg) (02/06 0530)     Intake/Output from previous day: 02/05 0701 - 02/06 0700 In: 600 [P.O.:600] Out: 312 [Urine:300; Chest Tube:12] Intake/Output this shift: Total I/O In: 480 [P.O.:480] Out: -   General appearance: alert, cooperative and no distress Heart: sinus tachycardia Lungs: clear to auscultation bilaterally and diminished in bilateral lower lobes Abdomen: soft, non-tender; bowel sounds normal; no masses,  no organomegaly Extremities: extremities normal, atraumatic, no cyanosis or edema Wound: chest tube with good placement and secure  Lab Results: Recent Labs    07/19/17 0410 07/20/17 0442  WBC 8.8 8.0  HGB 13.7 12.9  HCT 41.8 39.9  PLT 236 227   BMET:  Recent Labs    07/19/17 0410 07/20/17 0442  NA 138 137  K 4.8 4.1  CL 98* 99*  CO2 29 27  GLUCOSE 102* 112*  BUN 7 9  CREATININE 0.55 0.54  CALCIUM 9.2 9.0    PT/INR: No results for input(s): LABPROT, INR in the last 72 hours. ABG    Component Value Date/Time   TCO2 25 07/16/2008 1244   CBG (last 3)  No results for input(s): GLUCAP in the last 72 hours.  Assessment/Plan: 1. Chest tube-minimal drainage. CXR from today shows small left basilar pneumothorax. Biapical scarring. Small airleak, placed back on suction. Not a surgical candidate. Can maybe place talc in chest tube for a chemical pleurodesis. On 1L [place] with good oxygenation.  2. Sinus  tachycardia rate 110s-120s. BP well controlled. 3. Discussed smoking cessation yesterday during consult. On Nicotine patch 4. Anxiety-on Xanax and Cymbalta. Ambien ordered PRN for sleep.   Plan: Poor surgical candidate. Continue medical care per primary. Will discuss talc slurry injection into pigtail catheter with Dr. Servando Snare. This would need done once air leak resolves.     LOS: 2 days    Elgie Collard 07/20/2017  Chest tube on water seal now, no air leak I have seen and examined Darlene [name] and agree with the above assessment  and plan.  Grace Isaac MD [facility] [phone] Office [phone] 07/20/2017 6:08 PM

## 2017-07-20 NOTE — Progress Notes (Signed)
PULMONARY / CRITICAL CARE MEDICINE   Name: Darlene [name] MRN: [id number] DOB: 1956-05-21    ADMISSION DATE:  07/18/2017 CONSULTATION DATE:  07/18/17  REFERRING MD:  Dr. Wilson Singer   CHIEF COMPLAINT:  Chest Pain   HISTORY OF PRESENT ILLNESS:  62 y/o F with GOLD III COPD & ongoing smoking who presented to [facility] on 2/4 with complaints of ~ 24 hours of chest and back pain with associated shortness of breath.    She was recently admitted from 1/4-1/8 with a spontaneous pneumothorax.  A chest tube was placed per the EDP.  CVTS evaluated her at that time for possible VATS pleurodesis and it was felt at that time that she was not a candidate for surgery.  Dr. Chase Caller then recommended to reconsult CVTS to discuss VATS pleurodesis / blebectomy.  In the interim, PTX resolved and chest tube removed. She was discharged home with plans for follow up with Pulmonary.    The patient reports she began having pain in her back on 2/3 which progressed to her chest 2/4.  She states she continues to smoke. She reported pain with deep inspiration and splinted from taking a deep breath.  Has been taking mucinex for cough to loosen secretions.  On arrival, CXR demonstrated a moderate left pneumothorax.  Left small bore catheter was placed by the EDP on 2/4, placed to 20 cm suction.  Patient states she is reluctant to have any further procedures completed in regards to PTX.   CVTS called by ED who asked PCCM called for help with management of chest tube.  SUBJECTIVE:    Walked in to find pt in acute distress w/ pleuritic type left chest pain When placed CT to 20 cm sxn had immediate airleak   VITAL SIGNS: BP 124/89 (BP Location: Right Arm)   Pulse (!) 102   Temp 97.8 F (36.6 C) (Oral)   Resp 20   Ht 5\' 4"  (1.626 m)   Wt 87 lb (39.5 kg)   SpO2 95%   BMI 14.93 kg/m   HEMODYNAMICS:    VENTILATOR SETTINGS:    INTAKE / OUTPUT: I/O last 3 completed shifts: In: 37 [P.O.:600; I.V.:3] Out: 612 [Urine:600;  Chest Tube:12]  PHYSICAL EXAMINATION: General: frail 62 year old white female. Now w/ left sided chest pain  HENT: NCAT. No JVD MMM Pulm: some scattered rhonchi. Decreased on left.  Left chest tube: after being placed back to sxn w initally 4/7 airleak then intermittent 1/7 air leak Card: RRR  Abd: soft, not tender Ext: no edema   LABS:  BMET Recent Labs  Lab 07/18/17 1223 07/19/17 0410 07/20/17 0442  NA 136 138 137  K 4.6 4.8 4.1  CL 99* 98* 99*  CO2 23 29 27   BUN 8 7 9   CREATININE 0.56 0.55 0.54  GLUCOSE 149* 102* 112*    Electrolytes Recent Labs  Lab 07/18/17 1223 07/19/17 0410 07/20/17 0442  CALCIUM 9.6 9.2 9.0    CBC Recent Labs  Lab 07/18/17 1223 07/19/17 0410 07/20/17 0442  WBC 9.0 8.8 8.0  HGB 14.9 13.7 12.9  HCT 44.9 41.8 39.9  PLT 280 236 227    Coag's No results for input(s): APTT, INR in the last 168 hours.  Sepsis Markers No results for input(s): LATICACIDVEN, PROCALCITON, O2SATVEN in the last 168 hours.  ABG No results for input(s): PHART, PCO2ART, PO2ART in the last 168 hours.  Liver Enzymes No results for input(s): AST, ALT, ALKPHOS, BILITOT, ALBUMIN in the last 168  hours.  Cardiac Enzymes No results for input(s): TROPONINI, PROBNP in the last 168 hours.  Glucose No results for input(s): GLUCAP in the last 168 hours.  Imaging Dg Chest Port 1 View  Result Date: 07/20/2017 CLINICAL DATA:  Left chest tube EXAM: PORTABLE CHEST 1 VIEW COMPARISON:  07/19/2017 FINDINGS: Left chest tube remains in place. Very small left basilar pneumothorax noted. Scarring in the apices. Heart is normal size. No acute bony abnormality. IMPRESSION: Left chest tube remains in stable position. Small left basilar pneumothorax, similar to prior study. Biapical scarring. Electronically Signed   By: Rolm Baptise M.D.   On: 07/20/2017 09:52   Dg Chest Port 1 View  Result Date: 07/19/2017 CLINICAL DATA:  Pneumothorax. EXAM: PORTABLE CHEST 1 VIEW COMPARISON:   Radiograph of same day. FINDINGS: The heart size and mediastinal contours are within normal limits. Atherosclerosis of thoracic aorta is noted. Right lung is clear. Left-sided chest tube is unchanged in position. Minimal left apical pneumothorax is noted. The visualized skeletal structures are unremarkable. IMPRESSION: Left-sided chest tube is unchanged in position. Minimal left apical pneumothorax is noted. Electronically Signed   By: Marijo Conception, M.D.   On: 07/19/2017 18:22     STUDIES:  CXR 2/4 >> moderate left pneumothorax  CXR 2/5 >> Resolved pneumothorax, CT in place. I have reviewed the images personally  CULTURES: None.  ANTIBIOTICS: None.  SIGNIFICANT EVENTS: 2/04  Admit with chest / back pain, found to have recurrent pneumothorax, small bore chest tube placed.  LINES/TUBES: Left sided pigtail chest tube 2/4 >   DISCUSSION: 62 year old with advanced COPD, active smoker admitted with recurrent left-sided pneumothorax PCCM asked to consult for chest tube management.  ASSESSMENT / PLAN:  Recurrent pneumothorax -minimal left basilar ptx on cxr. BUT still w/ airleak Plan Resume CT back to 20 cm suction Repeat cxr in am  Further recs per CVTS  COPD, no sign of acute exacerbation Cont bds Smoking cessatuib   Abnormal CT, right upper lobe cavitary lesion, pulmonary nodules Patient has refused biopsy. Follow-up in clinic with [name].  Consider PET scan as an outpatient.  Erick Colace ACNP-BC [place] Pager # [phone] OR # [phone] if no answer

## 2017-07-21 ENCOUNTER — Inpatient Hospital Stay (HOSPITAL_COMMUNITY): Payer: BC Managed Care – PPO

## 2017-07-21 LAB — BASIC METABOLIC PANEL
ANION GAP: 15 (ref 5–15)
BUN: 14 mg/dL (ref 6–20)
CALCIUM: 9.3 mg/dL (ref 8.9–10.3)
CO2: 23 mmol/L (ref 22–32)
Chloride: 100 mmol/L — ABNORMAL LOW (ref 101–111)
Creatinine, Ser: 0.55 mg/dL (ref 0.44–1.00)
GFR calc non Af Amer: >60 mL/min (ref >60)
Glucose, Bld: 85 mg/dL (ref 65–99)
Potassium: 4.1 mmol/L (ref 3.5–5.1)
Sodium: 138 mmol/L (ref 135–145)

## 2017-07-21 LAB — CBC
HEMATOCRIT: 41.9 % (ref 36.0–46.0)
Hemoglobin: 13.5 g/dL (ref 12.0–15.0)
MCH: 33.9 pg (ref 26.0–34.0)
MCHC: 32.2 g/dL (ref 30.0–36.0)
MCV: 105.3 fL — ABNORMAL HIGH (ref 78.0–100.0)
Platelets: 226 10*3/uL (ref 150–400)
RBC: 3.98 MIL/uL (ref 3.87–5.11)
RDW: 12.7 % (ref 11.5–15.5)
WBC: 8.1 10*3/uL (ref 4.0–10.5)

## 2017-07-21 NOTE — Progress Notes (Signed)
PULMONARY / CRITICAL CARE MEDICINE   Name: Darlene [name] MRN: [id number] DOB: 1955-11-25    ADMISSION DATE:  07/18/2017 CONSULTATION DATE:  07/18/17  REFERRING MD:  Dr. Wilson Singer   CHIEF COMPLAINT:  Chest Pain   HISTORY OF PRESENT ILLNESS:  62 y/o F with GOLD III COPD & ongoing smoking who presented to [facility] on 2/4 with complaints of ~ 24 hours of chest and back pain with associated shortness of breath.    She was recently admitted from 1/4-1/8 with a spontaneous pneumothorax.  A chest tube was placed per the EDP.  CVTS evaluated her at that time for possible VATS pleurodesis and it was felt at that time that she was not a candidate for surgery.  Dr. Chase Caller then recommended to reconsult CVTS to discuss VATS pleurodesis / blebectomy.  In the interim, PTX resolved and chest tube removed. She was discharged home with plans for follow up with Pulmonary.    The patient reports she began having pain in her back on 2/3 which progressed to her chest 2/4.  She states she continues to smoke. She reported pain with deep inspiration and splinted from taking a deep breath.  Has been taking mucinex for cough to loosen secretions.  On arrival, CXR demonstrated a moderate left pneumothorax.  Left small bore catheter was placed by the EDP on 2/4, placed to 20 cm suction.  Patient states she is reluctant to have any further procedures completed in regards to PTX.   CVTS called by ED who asked PCCM called for help with management of chest tube.  SUBJECTIVE:    Chest pain better  VITAL SIGNS: BP 140/90 (BP Location: Right Arm)   Pulse (!) 107   Temp 97.9 F (36.6 C) (Oral)   Resp (!) 22   Ht 5\' 4"  (1.626 m)   Wt 87 lb 14.4 oz (39.9 kg)   SpO2 97%   BMI 15.09 kg/m   HEMODYNAMICS:    VENTILATOR SETTINGS:    INTAKE / OUTPUT: I/O last 3 completed shifts: In: 2040 [P.O.:2040] Out: 354 [Urine:300; Chest Tube:54]  PHYSICAL EXAMINATION: General: This is a frail 62 year old female resting  comfortably in bed HEENT: Normocephalic atraumatic no jugular venous distention Pulmonary: Diminished bases, now has a persistent left-sided air leak measured at approximately 2 out of 7 and continuous, I broke the chest tube dressing down, there are no visual side ports, all ports appear intact Cardiac: Regular rate and rhythm Abdomen: Extremities: Brisk cap refill no edema Neuro/psych: Awake oriented no focal deficits LABS:  BMET Recent Labs  Lab 07/19/17 0410 07/20/17 0442 07/21/17 0426  NA 138 137 138  K 4.8 4.1 4.1  CL 98* 99* 100*  CO2 29 27 23   BUN 7 9 14   CREATININE 0.55 0.54 0.55  GLUCOSE 102* 112* 85    Electrolytes Recent Labs  Lab 07/19/17 0410 07/20/17 0442 07/21/17 0426  CALCIUM 9.2 9.0 9.3    CBC Recent Labs  Lab 07/19/17 0410 07/20/17 0442 07/21/17 0426  WBC 8.8 8.0 8.1  HGB 13.7 12.9 13.5  HCT 41.8 39.9 41.9  PLT 236 227 226    Coag's No results for input(s): APTT, INR in the last 168 hours.  Sepsis Markers No results for input(s): LATICACIDVEN, PROCALCITON, O2SATVEN in the last 168 hours.  ABG No results for input(s): PHART, PCO2ART, PO2ART in the last 168 hours.  Liver Enzymes No results for input(s): AST, ALT, ALKPHOS, BILITOT, ALBUMIN in the last 168 hours.  Cardiac Enzymes No  results for input(s): TROPONINI, PROBNP in the last 168 hours.  Glucose No results for input(s): GLUCAP in the last 168 hours.  Imaging Dg Chest Port 1 View  Result Date: 07/21/2017 CLINICAL DATA:  Chest tube in place.  Recent pneumothorax. EXAM: PORTABLE CHEST 1 VIEW COMPARISON:  July 20, 2017 chest radiograph and chest CT June 18, 2017 FINDINGS: Chest tube remains on the left. There is a minimal left apical pneumothorax without tension component. The ill-defined nodular opacity in the left upper lobe is less apparent compared to 1 day prior. There is mild left base atelectasis. On the right, there is ill-defined opacity in the right upper lobe near the  apex. This opacity has been noted previously. The cavitation in this area noted on prior CT is not well seen by radiography. Right lung otherwise is clear. Heart size and pulmonary vascularity normal. No adenopathy. There is aortic atherosclerosis. Bones appear osteoporotic. IMPRESSION: 1. Chest tube present on the left. Minimal left apical pneumothorax. Mild left base atelectasis. 2. Ill-defined opacity right upper lobe near the apex. The cavitation noted in this area on prior CT is not well seen by radiography. Right lung is otherwise clear. 3. There is aortic atherosclerosis. Heart size normal. No adenopathy evident. 4.  Bones osteoporotic. Aortic Atherosclerosis (ICD10-I70.0). Electronically Signed   By: Lowella Grip III M.D.   On: 07/21/2017 09:21   Dg Chest Port 1 View  Result Date: 07/20/2017 CLINICAL DATA:  Left pneumothorax.  Chest tube in place. EXAM: PORTABLE CHEST 1 VIEW COMPARISON:  Chest x-rays dated 07/19/2017, 07/18/2017, and 06/21/2017 and chest CT dated 06/18/2017 FINDINGS: There is a small left apical pneumothorax. No visible residual left basal pneumothorax. Small chest tube remains in place. Calcified granuloma in the superior segment of the left lower lobe is again noted. Irregular cavitary lesion in the right lung apex is not well seen on the current chest x-ray. Right lung is otherwise clear. Heart size and vascularity are normal. IMPRESSION: 1. Small left apical pneumothorax. 2. Cavitary lesion in the right lung apex noted on prior CT scan is not well seen on the current chest x-ray. Electronically Signed   By: Lorriane Shire M.D.   On: 07/20/2017 16:25     STUDIES:  CXR 2/4 >> moderate left pneumothorax  CXR 2/5 >> Resolved pneumothorax, CT in place. I have reviewed the images personally  CULTURES: None.  ANTIBIOTICS: None.  SIGNIFICANT EVENTS: 2/04  Admit with chest / back pain, found to have recurrent pneumothorax, small bore chest tube  placed.  LINES/TUBES: Left sided pigtail chest tube 2/4 >   DISCUSSION: 62 year old with advanced COPD, active smoker admitted with recurrent left-sided pneumothorax PCCM asked to consult for chest tube management.  ASSESSMENT / PLAN:  Recurrent pneumothorax Small persistent left apical PTX; now w/ PERSISTENT airleak Plan Cont snx at 20 Repeat cxr this afternoon (to assure tube still in good position) and again in am  Eventually talc pleurodesis when ptx resolved  COPD, no sign of acute exacerbation Cont bds Cont smoking cessation   Abnormal CT, right upper lobe cavitary lesion, pulmonary nodules Patient has refused biopsy. Follow-up in clinic with [name].  Consider PET scan as an outpatient.  Erick Colace ACNP-BC [place] Pager # [phone] OR # [phone] if no answer

## 2017-07-21 NOTE — Progress Notes (Signed)
PROGRESS NOTE    Darlene [name]  VQM:[id number] DOB: [birth year] DOA: 07/18/2017 PCP: [name], MD     Brief Narrative:  Darlene [name] is a 62 y.o. female with medical history significant of COPD, tobacco abuse, who presents to the hospital with 2 day history of chest pain, back pain, shortness of breath. She was recently hospitalized from 1/4-1/8 with spontaneous left pneumothorax, treated with chest tube. During that admission, cardiothoracic surgery did not feel she was a candidate for VATS pleurodesis/blebectomy. She improved and was discharged home. She now returns with chest pain, back pain, and SOB with exertion since day prior to admission. In the ED, CXR revealed moderate left pneumothorax. PCCM was consulted. Chest tube placed.   Assessment & Plan:   Recurrent spontaneous left pneumothorax -Recently admitted 1/4-1/8 for left pneumothorax, managed with chest tube and evaluated by cardiothoracic surgery and PCCM. At that time, patient was not deemed candidate for VATS -Status post chest tube, had recurrence of small pneumothorax in left apex yesterday and hence chest tube placed back to suction,this is improving but not resolved  -Continue current management and repeat chest x-ray tomorrow a.m.  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 with lobar pulmonary and declined biopsy at prior visit, importance of biopsy/PET scan reiterated with patient  COPD  -Stable, continue nebs when necessary  Depression/anxiety -Continue cymbalta, xanax  Tobacco abuse -Cessation counseling, nicotine patch daily   Severe malnutrition -Supplements advised  DVT prophylaxis: Lovenox Code Status: Full Family Communication: No family at bedside Disposition Plan: Pending improvement,  pleurodesis   Consultants:   PCCM  CVTS Dr. Servando Snare   Procedures:   Chest tube placed in ED 2/4  Antimicrobials:  Anti-infectives (From admission, onward)   None       Subjective: -Left upper lung, shoulder pain pain is much improved, Toradol helping, denies any dyspnea cough or wheezing  Objective: Vitals:   07/20/17 2104 07/20/17 2333 07/21/17 0413 07/21/17 0729  BP: 129/89 129/90 140/90   Pulse: (!) 106 (!) 108 (!) 107   Resp: (!) 28 (!) 21 (!) 22   Temp: 98.6 F (37 C)  97.9 F (36.6 C)   TempSrc: Oral  Oral   SpO2: 95% 98% 95% 97%  Weight:   39.9 kg (87 lb 14.4 oz)   Height:        Intake/Output Summary (Last 24 hours) at 07/21/2017 1340 Last data filed at 07/21/2017 1000 Gross per 24 hour  Intake 1280 ml  Output 342 ml  Net 938 ml   Filed Weights   07/19/17 0500 07/20/17 0530 07/21/17 0413  Weight: 38.6 kg (85 lb) 39.5 kg (87 lb) 39.9 kg (87 lb 14.4 oz)    Examination: Gen: Awake, Alert, Oriented X 3, extremely cachectic female HEENT: PERRLA, Neck supple, no JVD Lungs: Poor air movement, no wheezes noted CVS: RRR,No Gallops,Rubs or new Murmurs Abd: soft, Non tender, non distended, BS present Extremities: No Cyanosis, Clubbing or edema Skin: no new rashes   Data Reviewed: I have personally reviewed following labs and imaging studies  CBC: Recent Labs  Lab 07/18/17 1223 07/19/17 0410 07/20/17 0442 07/21/17 0426  WBC 9.0 8.8 8.0 8.1  HGB 14.9 13.7 12.9 13.5  HCT 44.9 41.8 39.9 41.9  MCV 104.2* 104.5* 104.7* 105.3*  PLT 280 236 227  478   Basic Metabolic Panel: Recent Labs  Lab 07/18/17 1223 07/19/17 0410 07/20/17 0442 07/21/17 0426  NA 136 138 137 138  K 4.6 4.8 4.1 4.1  CL 99* 98* 99* 100*  CO2 23 29 27 23   GLUCOSE 149* 102* 112* 85  BUN 8 7 9 14   CREATININE 0.56 0.55 0.54 0.55  CALCIUM 9.6 9.2 9.0 9.3   GFR: Estimated Creatinine Clearance: 45.9 mL/min (by C-G formula based on SCr of 0.55 mg/dL). Liver Function Tests: No  results for input(s): AST, ALT, ALKPHOS, BILITOT, PROT, ALBUMIN in the last 168 hours. No results for input(s): LIPASE, AMYLASE in the last 168 hours. No results for input(s): AMMONIA in the last 168 hours. Coagulation Profile: No results for input(s): INR, PROTIME in the last 168 hours. Cardiac Enzymes: No results for input(s): CKTOTAL, CKMB, CKMBINDEX, TROPONINI in the last 168 hours. BNP (last 3 results) No results for input(s): PROBNP in the last 8760 hours. HbA1C: No results for input(s): HGBA1C in the last 72 hours. CBG: No results for input(s): GLUCAP in the last 168 hours. Lipid Profile: No results for input(s): CHOL, HDL, LDLCALC, TRIG, CHOLHDL, LDLDIRECT in the last 72 hours. Thyroid Function Tests: No results for input(s): TSH, T4TOTAL, FREET4, T3FREE, THYROIDAB in the last 72 hours. Anemia Panel: No results for input(s): VITAMINB12, FOLATE, FERRITIN, TIBC, IRON, RETICCTPCT in the last 72 hours. Sepsis Labs: No results for input(s): PROCALCITON, LATICACIDVEN in the last 168 hours.  No results found for this or any previous visit (from the past 240 hour(s)).     Radiology Studies: Dg Chest Port 1 View  Result Date: 07/21/2017 CLINICAL DATA:  Chest tube in place.  Recent pneumothorax. EXAM: PORTABLE CHEST 1 VIEW COMPARISON:  July 20, 2017 chest radiograph and chest CT June 18, 2017 FINDINGS: Chest tube remains on the left. There is a minimal left apical pneumothorax without tension component. The ill-defined nodular opacity in the left upper lobe is less apparent compared to 1 day prior. There is mild left base atelectasis. On the right, there is ill-defined opacity in the right upper lobe near the apex. This opacity has been noted previously. The cavitation in this area noted on prior CT is not well seen by radiography. Right lung otherwise is clear. Heart size and pulmonary vascularity normal. No adenopathy. There is aortic atherosclerosis. Bones appear osteoporotic.  IMPRESSION: 1. Chest tube present on the left. Minimal left apical pneumothorax. Mild left base atelectasis. 2. Ill-defined opacity right upper lobe near the apex. The cavitation noted in this area on prior CT is not well seen by radiography. Right lung is otherwise clear. 3. There is aortic atherosclerosis. Heart size normal. No adenopathy evident. 4.  Bones osteoporotic. Aortic Atherosclerosis (ICD10-I70.0). Electronically Signed   By: Lowella Grip III M.D.   On: 07/21/2017 09:21   Dg Chest Port 1 View  Result Date: 07/20/2017 CLINICAL DATA:  Left pneumothorax.  Chest tube in place. EXAM: PORTABLE CHEST 1 VIEW COMPARISON:  Chest x-rays dated 07/19/2017, 07/18/2017, and 06/21/2017 and chest CT dated 06/18/2017 FINDINGS: There is a small left apical pneumothorax. No visible residual left basal pneumothorax. Small chest tube remains in place. Calcified granuloma in the superior segment of the left lower lobe is again noted. Irregular cavitary lesion in the right lung apex is not well seen on the current chest x-ray. Right lung is otherwise clear. Heart size and vascularity are normal. IMPRESSION: 1. Small left apical pneumothorax. 2. Cavitary lesion in the right lung apex  noted on prior CT scan is not well seen on the current chest x-ray. Electronically Signed   By: Lorriane Shire M.D.   On: 07/20/2017 16:25   Dg Chest Port 1 View  Result Date: 07/20/2017 CLINICAL DATA:  Left chest tube EXAM: PORTABLE CHEST 1 VIEW COMPARISON:  07/19/2017 FINDINGS: Left chest tube remains in place. Very small left basilar pneumothorax noted. Scarring in the apices. Heart is normal size. No acute bony abnormality. IMPRESSION: Left chest tube remains in stable position. Small left basilar pneumothorax, similar to prior study. Biapical scarring. Electronically Signed   By: Rolm Baptise M.D.   On: 07/20/2017 09:52   Dg Chest Port 1 View  Result Date: 07/19/2017 CLINICAL DATA:  Pneumothorax. EXAM: PORTABLE CHEST 1 VIEW  COMPARISON:  Radiograph of same day. FINDINGS: The heart size and mediastinal contours are within normal limits. Atherosclerosis of thoracic aorta is noted. Right lung is clear. Left-sided chest tube is unchanged in position. Minimal left apical pneumothorax is noted. The visualized skeletal structures are unremarkable. IMPRESSION: Left-sided chest tube is unchanged in position. Minimal left apical pneumothorax is noted. Electronically Signed   By: Marijo Conception, M.D.   On: 07/19/2017 18:22      Scheduled Meds: . ALPRAZolam  0.25 mg Oral Daily  . arformoterol  15 mcg Nebulization BID  . budesonide (PULMICORT) nebulizer solution  0.5 mg Nebulization BID  . DULoxetine  60 mg Oral Daily  . enoxaparin (LOVENOX) injection  30 mg Subcutaneous Q24H  . feeding supplement (ENSURE ENLIVE)  237 mL Oral BID BM  . ipratropium  0.5 mg Nebulization TID  . nicotine  21 mg Transdermal Daily  . sodium chloride flush  3 mL Intravenous Q12H   Continuous Infusions: . sodium chloride       LOS: 3 days    Time spent: 15 minutes   Domenic Polite, MD [facility] [email or web address] Password [email or web address] 07/21/2017, 1:40 PM

## 2017-07-21 NOTE — Progress Notes (Signed)
PCXR reviewed  Chest tube has been inadvertently removed from pleural space explaining the constant air leak.  There is minimal PTX per radiology read. I do not see PTX at all.   Plan Remove CT (no longer functional) Repeat CXR in am   Erick Colace ACNP-BC [place] Pager # [phone] OR # [phone] if no answer

## 2017-07-22 ENCOUNTER — Inpatient Hospital Stay (HOSPITAL_COMMUNITY): Payer: BC Managed Care – PPO

## 2017-07-22 ENCOUNTER — Telehealth: Payer: Self-pay | Admitting: Internal Medicine

## 2017-07-22 ENCOUNTER — Ambulatory Visit: Payer: BC Managed Care – PPO | Admitting: Internal Medicine

## 2017-07-22 DIAGNOSIS — Z720 Tobacco use: Secondary | ICD-10-CM

## 2017-07-22 LAB — TSH: TSH: 0.789 u[IU]/mL (ref 0.350–4.500)

## 2017-07-22 MED ORDER — LEVALBUTEROL HCL 0.63 MG/3ML IN NEBU: 0.6300 mg | INHALATION_SOLUTION | Freq: Four times a day (QID) | RESPIRATORY_TRACT | Status: DC | PRN

## 2017-07-22 NOTE — Progress Notes (Signed)
PROGRESS NOTE    Darlene [name]  ZOX:[id number] DOB: [birth year] DOA: 07/18/2017 PCP: [name], MD     Brief Narrative:  Darlene [name] is a 62 y.o. female with medical history significant of COPD, tobacco abuse, who presents to the hospital with 2 day history of chest pain, back pain, shortness of breath. She was recently hospitalized from 1/4-1/8 with spontaneous left pneumothorax, treated with chest tube. During that admission, cardiothoracic surgery did not feel she was a candidate for VATS pleurodesis/blebectomy. She improved and was discharged home. She now returns with chest pain, back pain, and SOB with exertion since day prior to admission. In the ED, CXR revealed moderate left pneumothorax. PCCM was consulted. Chest tube placed.   Assessment & Plan:   Recurrent spontaneous left pneumothorax -Recently admitted 1/4-1/8 for left pneumothorax, managed with chest tube and evaluated by cardiothoracic surgery and PCCM. 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 with lobar pulmonary and declined biopsy at prior visit, importance of biopsy/PET scan reiterated with patient  Severe Tachycardia -HR in 120-140 range, sinus rhythm today -check TSH -she is asymptomatic -changed albuterol to xopenex -monitor  COPD  -Stable, continue nebs when necessary  Depression/anxiety -Continue cymbalta, xanax  Tobacco abuse -Cessation counseling,  nicotine patch daily   Severe malnutrition -Supplements advised  DVT prophylaxis: Lovenox Code Status: Full Family Communication: No family at bedside Disposition Plan: home later today or in am   Consultants:   PCCM  CVTS Dr. Servando Snare   Procedures:   Chest tube placed in ED 2/4  Antimicrobials:  Anti-infectives (From admission, onward)   None       Subjective: -Left upper lung, shoulder pain pain is much improved, Toradol helping, denies any dyspnea cough or wheezing  Objective: Vitals:   07/22/17 1304 07/22/17 1305 07/22/17 1425 07/22/17 1448  BP:    113/78  Pulse: (!) 138     Resp: (!) 21 (!) 21    Temp:    98.6 F (37 C)  TempSrc:    Oral  SpO2: 95%  96% 100%  Weight:      Height:       No intake or output data in the 24 hours ending 07/22/17 1500 Filed Weights   07/20/17 0530 07/21/17 0413 07/22/17 0600  Weight: 39.5 kg (87 lb) 39.9 kg (87 lb 14.4 oz) 39.9 kg (87 lb 15.4 oz)    Examination: Gen: Awake, Alert, Oriented X 3, extremely cachectic female HEENT: PERRLA, Neck supple, no JVD Lungs: Poor air movement, no wheezes noted CVS: RRR,No Gallops,Rubs or new Murmurs Abd: soft, Non tender, non distended, BS present Extremities: No Cyanosis, Clubbing or edema Skin: no new rashes   Data Reviewed: I have personally reviewed following labs and imaging studies  CBC: Recent Labs  Lab 07/18/17 1223 07/19/17 0410 07/20/17 0442 07/21/17 0426  WBC 9.0 8.8 8.0 8.1  HGB 14.9 13.7 12.9 13.5  HCT 44.9 41.8 39.9 41.9  MCV 104.2* 104.5*  104.7* 105.3*  PLT 280 236 227 970   Basic Metabolic Panel: Recent Labs  Lab 07/18/17 1223 07/19/17 0410 07/20/17 0442 07/21/17 0426  NA 136 138 137 138  K 4.6 4.8 4.1 4.1  CL 99* 98* 99* 100*  CO2 23 29 27 23   GLUCOSE 149* 102* 112* 85  BUN 8 7 9 14   CREATININE 0.56 0.55 0.54 0.55  CALCIUM 9.6 9.2 9.0 9.3   GFR: Estimated Creatinine Clearance: 45.9 mL/min (by C-G formula based on SCr of 0.55  mg/dL). Liver Function Tests: No results for input(s): AST, ALT, ALKPHOS, BILITOT, PROT, ALBUMIN in the last 168 hours. No results for input(s): LIPASE, AMYLASE in the last 168 hours. No results for input(s): AMMONIA in the last 168 hours. Coagulation Profile: No results for input(s): INR, PROTIME in the last 168 hours. Cardiac Enzymes: No results for input(s): CKTOTAL, CKMB, CKMBINDEX, TROPONINI in the last 168 hours. BNP (last 3 results) No results for input(s): PROBNP in the last 8760 hours. HbA1C: No results for input(s): HGBA1C in the last 72 hours. CBG: No results for input(s): GLUCAP in the last 168 hours. Lipid Profile: No results for input(s): CHOL, HDL, LDLCALC, TRIG, CHOLHDL, LDLDIRECT in the last 72 hours. Thyroid Function Tests: Recent Labs    07/22/17 1344  TSH 0.789   Anemia Panel: No results for input(s): VITAMINB12, FOLATE, FERRITIN, TIBC, IRON, RETICCTPCT in the last 72 hours. Sepsis Labs: No results for input(s): PROCALCITON, LATICACIDVEN in the last 168 hours.  No results found for this or any previous visit (from the past 240 hour(s)).     Radiology Studies: Dg Chest Port 1 View  Result Date: 07/22/2017 CLINICAL DATA:  Follow-up pneumothorax EXAM: PORTABLE CHEST 1 VIEW COMPARISON:  07/21/2017 FINDINGS: Cardiac shadow is stable. The lungs are well aerated bilaterally. Previously seen nodular density in the left mid lung is again seen and stable. Left chest tube has been fully removed. No recurrent pneumothorax is seen. No sizable effusion is noted. IMPRESSION: No significant recurrent pneumothorax identified. Stable nodule in the left mid lung. Electronically Signed   By: Inez Catalina M.D.   On: 07/22/2017 08:28   Dg Chest Port 1 View  Result Date: 07/21/2017 CLINICAL DATA:  Left-sided chest pain shoulder pain. EXAM: PORTABLE CHEST 1 VIEW COMPARISON:  07/20/2017 FINDINGS: Cardiomediastinal silhouette is normal. Mediastinal contours appear intact. Calcific  atherosclerotic disease of the aorta. There is a tiny residual left pneumothorax. The left-sided chest tube has displaced and is seen within the soft tissues of the left chest wall. There is a small amount of subcutaneous emphysema. Redemonstration of 9 mm left lower lobe soft tissue pulmonary nodule, architectural distortion and scarring in bilateral lung apices. The cavitating lesion demonstrated by recent CT in the right upper lobe is not well seen radiographically. Osseous structures are without acute abnormality. Soft tissues are grossly normal. IMPRESSION: Tiny residual left pneumothorax. Left chest tube has displaced and is seen overlying the soft tissues of the left chest wall with associated subcutaneous emphysema. Electronically Signed   By: Fidela Salisbury M.D.   On: 07/21/2017 13:45   Dg Chest Port 1 View  Result Date: 07/21/2017 CLINICAL DATA:  Chest tube in place.  Recent pneumothorax. EXAM: PORTABLE CHEST 1 VIEW COMPARISON:  July 20, 2017 chest radiograph and chest CT June 18, 2017 FINDINGS: Chest tube remains on the left. There is a minimal left apical pneumothorax without tension component. The ill-defined nodular opacity in the left upper lobe is less apparent compared  to 1 day prior. There is mild left base atelectasis. On the right, there is ill-defined opacity in the right upper lobe near the apex. This opacity has been noted previously. The cavitation in this area noted on prior CT is not well seen by radiography. Right lung otherwise is clear. Heart size and pulmonary vascularity normal. No adenopathy. There is aortic atherosclerosis. Bones appear osteoporotic. IMPRESSION: 1. Chest tube present on the left. Minimal left apical pneumothorax. Mild left base atelectasis. 2. Ill-defined opacity right upper lobe near the apex. The cavitation noted in this area on prior CT is not well seen by radiography. Right lung is otherwise clear. 3. There is aortic atherosclerosis. Heart size  normal. No adenopathy evident. 4.  Bones osteoporotic. Aortic Atherosclerosis (ICD10-I70.0). Electronically Signed   By: Lowella Grip III M.D.   On: 07/21/2017 09:21   Dg Chest Port 1 View  Result Date: 07/20/2017 CLINICAL DATA:  Left pneumothorax.  Chest tube in place. EXAM: PORTABLE CHEST 1 VIEW COMPARISON:  Chest x-rays dated 07/19/2017, 07/18/2017, and 06/21/2017 and chest CT dated 06/18/2017 FINDINGS: There is a small left apical pneumothorax. No visible residual left basal pneumothorax. Small chest tube remains in place. Calcified granuloma in the superior segment of the left lower lobe is again noted. Irregular cavitary lesion in the right lung apex is not well seen on the current chest x-ray. Right lung is otherwise clear. Heart size and vascularity are normal. IMPRESSION: 1. Small left apical pneumothorax. 2. Cavitary lesion in the right lung apex noted on prior CT scan is not well seen on the current chest x-ray. Electronically Signed   By: Lorriane Shire M.D.   On: 07/20/2017 16:25      Scheduled Meds: . ALPRAZolam  0.25 mg Oral Daily  . arformoterol  15 mcg Nebulization BID  . budesonide (PULMICORT) nebulizer solution  0.5 mg Nebulization BID  . DULoxetine  60 mg Oral Daily  . enoxaparin (LOVENOX) injection  30 mg Subcutaneous Q24H  . feeding supplement (ENSURE ENLIVE)  237 mL Oral BID BM  . ipratropium  0.5 mg Nebulization TID  . nicotine  21 mg Transdermal Daily  . sodium chloride flush  3 mL Intravenous Q12H   Continuous Infusions: . sodium chloride       LOS: 4 days    Time spent: 25 minutes   Domenic Polite, MD [facility] [email or web address] Password [email or web address] 07/22/2017, 3:00 PM

## 2017-07-22 NOTE — Telephone Encounter (Signed)
Pt would like the hospital note faxed to her work at [employer].

## 2017-07-22 NOTE — Telephone Encounter (Signed)
ATC pt, no answer. Left message for pt to call back.  

## 2017-07-22 NOTE — Telephone Encounter (Signed)
Can you make a follow up clinic appointment in 2 weeks with CXR. Thanks

## 2017-07-22 NOTE — Progress Notes (Addendum)
PULMONARY / CRITICAL CARE MEDICINE   Name: Darlene [name] MRN: [id number] DOB: [birth year]    ADMISSION DATE:  07/18/2017 CONSULTATION DATE:  07/18/17  REFERRING MD:  Dr. Wilson Singer   CHIEF COMPLAINT:  Chest Pain, recurrent pneumothorax  HISTORY OF PRESENT ILLNESS:  62 y/o F with GOLD III COPD & ongoing smoking who presented to [facility] on 2/4 with complaints of ~ 24 hours of chest and back pain with associated shortness of breath.    She was recently admitted from 1/4-1/8 with a spontaneous pneumothorax.  A chest tube was placed per the EDP.  CVTS evaluated her at that time for possible VATS pleurodesis and it was felt at that time that she was not a candidate for surgery.  Dr. Chase Caller then recommended to reconsult CVTS to discuss VATS pleurodesis / blebectomy.  In the interim, PTX resolved and chest tube removed. She was discharged home with plans for follow up with Pulmonary.    The patient reports she began having pain in her back on 2/3 which progressed to her chest 2/4.  She states she continues to smoke. She reported pain with deep inspiration and splinted from taking a deep breath.  Has been taking mucinex for cough to loosen secretions.  On arrival, CXR demonstrated a moderate left pneumothorax.  Left small bore catheter was placed by the EDP on 2/4, placed to 20 cm suction.  Patient states she is reluctant to have any further procedures completed in regards to PTX.   CVTS called by ED who asked PCCM called for help with management of chest tube.  SUBJECTIVE:   Feels well, No distress. No events overnight CT has been removed as it is displaced into the soft tissue of the chest and out of the pleural space.   VITAL SIGNS: BP 136/80 (BP Location: Right Arm)   Pulse (!) 126   Temp 98.3 F (36.8 C) (Oral)   Resp (!) 23   Ht 5\' 4"  (1.626 m)   Wt 87 lb 15.4 oz (39.9 kg)   SpO2 94%   BMI 15.10 kg/m   HEMODYNAMICS:    VENTILATOR SETTINGS:    INTAKE / OUTPUT: I/O last 3 completed  shifts: In: 800 [P.O.:800] Out: 342 [Urine:300; Chest Tube:42]  PHYSICAL EXAMINATION: Gen:      No acute distress, frail HEENT:  EOMI, sclera anicteric Neck:     No masses; no thyromegaly Lungs:    Clear to auscultation bilaterally; normal respiratory effort CV:         Regular rate and rhythm; no murmurs Abd:      + bowel sounds; soft, non-tender; no palpable masses, no distension Ext:    No edema; adequate peripheral perfusion Skin:      Warm and dry; no rash Neuro: alert and oriented x 3 Psych: normal mood and affect  LABS:  BMET Recent Labs  Lab 07/19/17 0410 07/20/17 0442 07/21/17 0426  NA 138 137 138  K 4.8 4.1 4.1  CL 98* 99* 100*  CO2 29 27 23   BUN 7 9 14   CREATININE 0.55 0.54 0.55  GLUCOSE 102* 112* 85    Electrolytes Recent Labs  Lab 07/19/17 0410 07/20/17 0442 07/21/17 0426  CALCIUM 9.2 9.0 9.3    CBC Recent Labs  Lab 07/19/17 0410 07/20/17 0442 07/21/17 0426  WBC 8.8 8.0 8.1  HGB 13.7 12.9 13.5  HCT 41.8 39.9 41.9  PLT 236 227 226    Coag's No results for input(s): APTT, INR in the last  168 hours.  Sepsis Markers No results for input(s): LATICACIDVEN, PROCALCITON, O2SATVEN in the last 168 hours.  ABG No results for input(s): PHART, PCO2ART, PO2ART in the last 168 hours.  Liver Enzymes No results for input(s): AST, ALT, ALKPHOS, BILITOT, ALBUMIN in the last 168 hours.  Cardiac Enzymes No results for input(s): TROPONINI, PROBNP in the last 168 hours.  Glucose No results for input(s): GLUCAP in the last 168 hours.  Imaging Dg Chest Port 1 View  Result Date: 07/22/2017 CLINICAL DATA:  Follow-up pneumothorax EXAM: PORTABLE CHEST 1 VIEW COMPARISON:  07/21/2017 FINDINGS: Cardiac shadow is stable. The lungs are well aerated bilaterally. Previously seen nodular density in the left mid lung is again seen and stable. Left chest tube has been fully removed. No recurrent pneumothorax is seen. No sizable effusion is noted. IMPRESSION: No  significant recurrent pneumothorax identified. Stable nodule in the left mid lung. Electronically Signed   By: Inez Catalina M.D.   On: 07/22/2017 08:28   Dg Chest Port 1 View  Result Date: 07/21/2017 CLINICAL DATA:  Left-sided chest pain shoulder pain. EXAM: PORTABLE CHEST 1 VIEW COMPARISON:  07/20/2017 FINDINGS: Cardiomediastinal silhouette is normal. Mediastinal contours appear intact. Calcific atherosclerotic disease of the aorta. There is a tiny residual left pneumothorax. The left-sided chest tube has displaced and is seen within the soft tissues of the left chest wall. There is a small amount of subcutaneous emphysema. Redemonstration of 9 mm left lower lobe soft tissue pulmonary nodule, architectural distortion and scarring in bilateral lung apices. The cavitating lesion demonstrated by recent CT in the right upper lobe is not well seen radiographically. Osseous structures are without acute abnormality. Soft tissues are grossly normal. IMPRESSION: Tiny residual left pneumothorax. Left chest tube has displaced and is seen overlying the soft tissues of the left chest wall with associated subcutaneous emphysema. Electronically Signed   By: Fidela Salisbury M.D.   On: 07/21/2017 13:45    STUDIES:  CXR 2/4 >> moderate left pneumothorax  CXR 2/5 >> Resolved pneumothorax, CT in place. CXR 2/7 >> tiny apical pneumothorax, CT displaced CXR 2/8 >> No pneumothorax  I have reviewed the images personally  CULTURES: None.  ANTIBIOTICS: None.  SIGNIFICANT EVENTS: 2/04  Admit with chest / back pain, found to have recurrent pneumothorax, small bore chest tube placed. 2/7 Chest tube removed  LINES/TUBES: Left sided pigtail chest tube 2/4 > 2/7  DISCUSSION: 62 year old with advanced COPD, active smoker admitted with recurrent left-sided pneumothorax PCCM asked to consult for chest tube management.  ASSESSMENT / PLAN:  Recurrent pneumothorax Chest tube was displaced from the pleural space and  has been removed. No pneumothorax today.   Plan Unable to do talc pleurodesis as the chest tube is out Not a candidate for VATS, blebectomy per CVTS. Will check if any other intervention is possible.  Will arrange follow up in clinic in 2 weeks with chest x ray  COPD, no sign of acute exacerbation Continue bronchodilators,  Smoking cessation Check ambulatory sats before discharge  Abnormal CT, right upper lobe cavitary lesion, pulmonary nodules Patient has refused biopsy. Follow-up in clinic with [name].  Consider PET scan as an outpatient.  Discussed with Dr. Broadus John.   Marshell Garfinkel MD [facility] Pager [phone] If no answer or after 3pm call: [phone] 07/22/2017, 10:22 AM

## 2017-07-22 NOTE — Progress Notes (Addendum)
SATURATION QUALIFICATIONS: (This note is used to comply with regulatory documentation for home oxygen)  Patient Saturations on Room Air at Rest = 95%  Patient Saturations on Room Air while Ambulating = 78%  Patient Saturations on 4 Liters of oxygen while Ambulating = 92-96%  Please briefly explain why patient needs home oxygen: pt's oxygen level dropped to 79% on RA while ambulating. Pt stopped walking & her sat went up to 85% on RA, pt required 4L to maintain her O2 sats btwn 92-96% on 4 L [place].

## 2017-07-22 NOTE — Care Management Note (Signed)
Case Management Note  Patient Details  Name: Darlene [name] MRN: [id number] Date of Birth: July 19, 1955  Subjective/Objective: Pt presented for Recurrent spontaneous pneumothorax. Chest Tube removed 07-21-17. Plan for home on Oxygen support. PTA Independent from home and pt still works.                   Action/Plan: DME referral made to [facility] for 02. DME 02 to be delivered to room prior to d/c. No other home needs identified at time of visit.   Expected Discharge Date:                  Expected Discharge Plan:  Home/Self Care  In-House Referral:  NA  Discharge planning Services  CM Consult  Post Acute Care Choice:  Durable Medical Equipment Choice offered to:  Patient  DME Arranged:  N/A DME Agency:  NA  HH Arranged:  NA HH Agency:  NA  Status of Service:  Completed, signed off  If discussed at [place] of Stay Meetings, dates discussed:    Additional Comments:  Bethena Roys, RN 07/22/2017, 3:08 PM

## 2017-07-22 NOTE — Progress Notes (Signed)
MD notified of pt's hr sustaining at 140 for 1 min around 1304-1304. Strip saved & in chart. Pt asymptomatic. Will continue to monitor the pt. Hoover Brunette, RN

## 2017-07-22 NOTE — Progress Notes (Signed)
      [address]       [place] [address]             [phone]         Subjective: Feels good this afternoon. She is having less pain on the left side.   Objective: Vital signs in last 24 hours: Temp:  [98.2 F (36.8 C)-98.3 F (36.8 C)] 98.3 F (36.8 C) (02/08 0600) Pulse Rate:  [96-126] 126 (02/08 0900) Cardiac Rhythm: Sinus tachycardia (02/08 0900) Resp:  [17-23] 23 (02/08 0900) BP: (123-136)/(79-84) 136/80 (02/08 0600) SpO2:  [93 %-100 %] 94 % (02/08 0900) Weight:  [87 lb 15.4 oz (39.9 kg)] 87 lb 15.4 oz (39.9 kg) (02/08 0600)    Intake/Output from previous day: 02/07 0701 - 02/08 0700 In: 320 [P.O.:320] Out: -  Intake/Output this shift: No intake/output data recorded.  General appearance: alert, cooperative and no distress Heart: regular rate and rhythm, S1, S2 normal, no murmur, click, rub or gallop Lungs: clear to auscultation bilaterally Abdomen: soft, non-tender; bowel sounds normal; no masses,  no organomegaly Extremities: extremities normal, atraumatic, no cyanosis or edema  Lab Results: Recent Labs    07/20/17 0442 07/21/17 0426  WBC 8.0 8.1  HGB 12.9 13.5  HCT 39.9 41.9  PLT 227 226   BMET:  Recent Labs    07/20/17 0442 07/21/17 0426  NA 137 138  K 4.1 4.1  CL 99* 100*  CO2 27 23  GLUCOSE 112* 85  BUN 9 14  CREATININE 0.54 0.55  CALCIUM 9.0 9.3    PT/INR: No results for input(s): LABPROT, INR in the last 72 hours. ABG    Component Value Date/Time   TCO2 25 07/16/2008 1244   CBG (last 3)  No results for input(s): GLUCAP in the last 72 hours.  Assessment/Plan: 1. Chest tube has been removed. No residual pneumothorax on CXR. Tolerating 2L [place].  2. Remains tachycardic. BP well controlled 3. Recommend smoking cessation, continue Nicotine patches.  4. Anxiety-on Xanax and Cymbalta. Ambien ordered PRN for sleep.   Plan: f/u with Pulm in a few weeks with CXR. Can follow-up with our service PRN.      LOS: 4  days    Elgie Collard 07/22/2017

## 2017-07-22 NOTE — Telephone Encounter (Signed)
Spoke with patient. Advised her to ask the hospital staff for a work note since she was seen there and not here by MW. Patient verbalized understanding. Nothing else needed at time of call.

## 2017-07-23 ENCOUNTER — Inpatient Hospital Stay (HOSPITAL_COMMUNITY): Payer: BC Managed Care – PPO

## 2017-07-23 NOTE — Discharge Summary (Signed)
Physician Discharge Summary  Darlene [name] ACZ:[id number] DOB: January 02, 1956 DOA: 07/18/2017  PCP: [name], MD  Admit date: 07/18/2017 Discharge date: 07/23/2017  Time spent:45 minutes  Recommendations for Outpatient Follow-up:  1. Follow-up with Dr. Clois Comber on 2/25 for recurrent pneumothorax with chest x-ray 2.  Next time she presents to the emergency room with spontaneous pneumothorax needs a larger Chest tube and urgent CTS consult for [place] pleurodesis 3. PET scan for right lung cavitary lesion with multiple pulmonary nodules  Discharge Diagnoses:  Principal Problem:   Recurrent spontaneous pneumothorax   Right lung cavitary lesion   COPD GOLD III    Anxiety   Pulmonary nodules   Tobacco abuse   COPD with emphysema [facility])   Discharge Condition: Stable  Diet recommendation: Regular  Filed Weights   07/21/17 0413 07/22/17 0600 07/23/17 0545  Weight: 39.9 kg (87 lb 14.4 oz) 39.9 kg (87 lb 15.4 oz) 40 kg (88 lb 3.2 oz)    History of present illness:  Darlene [name] a 62 y.o.femalewith medical history significant ofCOPD, tobacco abuse, who presents to the hospitalwith 2 day history of chest pain, back pain, shortness of breath.    Hospital Course:   Recurrent spontaneous left pneumothorax -Recently admitted 1/4-1/8 for left pneumothorax, managed with chest tube and evaluated by cardiothoracic surgery and PCCM. At that time, patient was not deemed candidate for VATS -Status post chest tube, had recurrence of small pneumothorax in left apex and hence chest tube placed back to suction, now resolved, Chest tube removed as this was dislodged 2 days ago -repeat CXR with no PTX now -per Pulm FU in office in 2 weeks with CXR and pleurodesis when recurs -Ambulatory sats dropped below 88% and hence set up with home O2 at discharge  Right lung cavitary lesion with multiple pulmonary nodules -Cavitary lesion increasing in size based on labs CT scan in  January -Concerning for malignancy, she is followed by [name] with lobar pulmonary and declined biopsy at prior visit, importance of biopsy/PET scan reiterated with patient  Severe Tachycardia -HR in 120-140 range yesterday, sinus rhythm -TSH okay -she is asymptomatic -changed albuterol to xopenex, stopped Brovana -Heart rate improved to 95 to 115 range today  COPD  -Stable, continue nebs when necessary  Depression/anxiety -Continue cymbalta, xanax  Tobacco abuse -Cessation counseling, nicotine patch given  Severe malnutrition -Supplements advised  Consultants:   PCCM  CVTS Dr. Servando Snare   Procedures:   Chest tube placed in ED 2/4   Discharge Exam: Vitals:   07/23/17 0545 07/23/17 0715  BP: 127/77   Pulse: (!) 107   Resp: 18   Temp: 98 F (36.7 C)   SpO2: 95% 92%    General: AAO 3 Cardiovascular: S1-S2/regular rate rhythm Respiratory: Improved air movement bilaterally  Discharge Instructions    Allergies as of 07/23/2017      Reactions   Codeine Nausea And Vomiting      Medication List    TAKE these medications   acetaminophen 500 MG tablet Commonly known as:  TYLENOL Take 1,000 mg by mouth every 6 (six) hours as needed.   albuterol 108 (90 Base) MCG/ACT inhaler Commonly known as:  PROAIR HFA Inhale 2 puffs into the lungs every 6 (six) hours as needed for wheezing or shortness of breath.   ALPRAZolam 0.25 MG tablet Commonly known as:  XANAX Take 0.25 mg by mouth daily.   budesonide-formoterol 160-4.5 MCG/ACT inhaler Commonly known as:  SYMBICORT Inhale 2 puffs into the  lungs 2 (two) times daily.   DRY EYES OP Place 1 drop into both eyes daily as needed.   DULoxetine 60 MG capsule Commonly known as:  CYMBALTA Take 60 mg by mouth daily.   feeding supplement (ENSURE ENLIVE) Liqd Take 237 mLs by mouth 2 (two) times daily between meals.   guaiFENesin 600 MG 12 hr tablet Commonly known as:  MUCINEX Take 2 tablets (1,200 mg  total) by mouth 2 (two) times daily. What changed:  when to take this   nicotine 21 mg/24hr patch Commonly known as:  NICODERM CQ - dosed in mg/24 hours Place 1 patch (21 mg total) onto the skin daily.   Tiotropium Bromide Monohydrate 2.5 MCG/ACT Aers Commonly known as:  SPIRIVA RESPIMAT Inhale 2 puffs into the lungs daily.   traMADol 50 MG tablet Commonly known as:  ULTRAM Take 50 mg by mouth every 6 (six) hours as needed. for pain   Vitamin D (Ergocalciferol) 50000 units Caps capsule Commonly known as:  DRISDOL Take 50,000 Units by mouth every 14 (fourteen) days.   Vitamin D 2000 units tablet Take 6,000 Units by mouth daily.            Durable Medical Equipment  (From admission, onward)        Start     Ordered   07/22/17 1449  For home use only DME oxygen  Once    Question Answer Comment  Mode or (Route) Nasal cannula   Liters per Minute 2   Frequency Continuous (stationary and portable oxygen unit needed)   Oxygen conserving device Yes   Oxygen delivery system Gas      07/22/17 1448     Allergies  Allergen Reactions  . Codeine Nausea And Vomiting   Follow-up Information    [facility] Follow up.   Why:  Oxygen Contact information: [place] [address] [phone]        Tanda Rockers, MD. Go in 2 week(s).   Specialty:  Pulmonary Disease Contact information: [address] [place] [address] [phone]            The results of significant diagnostics from this hospitalization (including imaging, microbiology, ancillary and laboratory) are listed below for reference.    Significant Diagnostic Studies: Dg Chest 2 View  Result Date: 07/23/2017 CLINICAL DATA:  Follow-up left pneumothorax. EXAM: CHEST  2 VIEW COMPARISON:  07/22/2017 and prior radiographs FINDINGS: No definite pneumothorax identified on this study. The cardiomediastinal silhouette is unremarkable. Left mid lung nodule is again  identified. No other changes identified. IMPRESSION: No definite pneumothorax.  No other significant change. Electronically Signed   By: Margarette Canada M.D.   On: 07/23/2017 09:22   Dg Chest 2 View  Result Date: 07/18/2017 CLINICAL DATA:  Chest pain, shortness of breath. EXAM: CHEST  2 VIEW COMPARISON:  Radiograph June 21, 2016. FINDINGS: The heart size and mediastinal contours are within normal limits. Moderate size left pneumothorax is noted on the order of approximately 20%. Right lung is clear. Hyperexpansion of the lungs is noted. No definite pleural effusion is noted. Nodular density is noted in the left midlung. The visualized skeletal structures are unremarkable. IMPRESSION: Moderate size left pneumothorax is noted. Critical Value/emergent results were called by telephone at the time of interpretation on 07/18/2017 at 12:56 pm to Dr. Marda Stalker , who verbally acknowledged these results. Nodular density is noted in left midlung; CT scan of the chest is recommended  further evaluation. Hyperexpansion of the lungs is noted suggesting chronic obstructive pulmonary disease. Electronically Signed   By: Marijo Conception, M.D.   On: 07/18/2017 12:56   Dg Chest Port 1 View  Result Date: 07/22/2017 CLINICAL DATA:  Dyspnea.  Recent pneumothorax. EXAM: PORTABLE CHEST 1 VIEW COMPARISON:  Chest radiograph 07/22/2017 and before FINDINGS: Trace residual left apical pneumothorax. Nodular opacity in the left mid lung is unchanged. Unchanged left axillary subcutaneous emphysema. The lungs are clear. No pleural effusion. Normal cardiomediastinal contours. IMPRESSION: Trace residual left apical pneumothorax.  The Electronically Signed   By: Ulyses Jarred M.D.   On: 07/22/2017 16:41   Dg Chest Port 1 View  Result Date: 07/22/2017 CLINICAL DATA:  Follow-up pneumothorax EXAM: PORTABLE CHEST 1 VIEW COMPARISON:  07/21/2017 FINDINGS: Cardiac shadow is stable. The lungs are well aerated bilaterally. Previously seen nodular  density in the left mid lung is again seen and stable. Left chest tube has been fully removed. No recurrent pneumothorax is seen. No sizable effusion is noted. IMPRESSION: No significant recurrent pneumothorax identified. Stable nodule in the left mid lung. Electronically Signed   By: Inez Catalina M.D.   On: 07/22/2017 08:28   Dg Chest Port 1 View  Result Date: 07/21/2017 CLINICAL DATA:  Left-sided chest pain shoulder pain. EXAM: PORTABLE CHEST 1 VIEW COMPARISON:  07/20/2017 FINDINGS: Cardiomediastinal silhouette is normal. Mediastinal contours appear intact. Calcific atherosclerotic disease of the aorta. There is a tiny residual left pneumothorax. The left-sided chest tube has displaced and is seen within the soft tissues of the left chest wall. There is a small amount of subcutaneous emphysema. Redemonstration of 9 mm left lower lobe soft tissue pulmonary nodule, architectural distortion and scarring in bilateral lung apices. The cavitating lesion demonstrated by recent CT in the right upper lobe is not well seen radiographically. Osseous structures are without acute abnormality. Soft tissues are grossly normal. IMPRESSION: Tiny residual left pneumothorax. Left chest tube has displaced and is seen overlying the soft tissues of the left chest wall with associated subcutaneous emphysema. Electronically Signed   By: Fidela Salisbury M.D.   On: 07/21/2017 13:45   Dg Chest Port 1 View  Result Date: 07/21/2017 CLINICAL DATA:  Chest tube in place.  Recent pneumothorax. EXAM: PORTABLE CHEST 1 VIEW COMPARISON:  July 20, 2017 chest radiograph and chest CT June 18, 2017 FINDINGS: Chest tube remains on the left. There is a minimal left apical pneumothorax without tension component. The ill-defined nodular opacity in the left upper lobe is less apparent compared to 1 day prior. There is mild left base atelectasis. On the right, there is ill-defined opacity in the right upper lobe near the apex. This opacity has  been noted previously. The cavitation in this area noted on prior CT is not well seen by radiography. Right lung otherwise is clear. Heart size and pulmonary vascularity normal. No adenopathy. There is aortic atherosclerosis. Bones appear osteoporotic. IMPRESSION: 1. Chest tube present on the left. Minimal left apical pneumothorax. Mild left base atelectasis. 2. Ill-defined opacity right upper lobe near the apex. The cavitation noted in this area on prior CT is not well seen by radiography. Right lung is otherwise clear. 3. There is aortic atherosclerosis. Heart size normal. No adenopathy evident. 4.  Bones osteoporotic. Aortic Atherosclerosis (ICD10-I70.0). Electronically Signed   By: Lowella Grip III M.D.   On: 07/21/2017 09:21   Dg Chest Port 1 View  Result Date: 07/20/2017 CLINICAL DATA:  Left pneumothorax.  Chest tube in  place. EXAM: PORTABLE CHEST 1 VIEW COMPARISON:  Chest x-rays dated 07/19/2017, 07/18/2017, and 06/21/2017 and chest CT dated 06/18/2017 FINDINGS: There is a small left apical pneumothorax. No visible residual left basal pneumothorax. Small chest tube remains in place. Calcified granuloma in the superior segment of the left lower lobe is again noted. Irregular cavitary lesion in the right lung apex is not well seen on the current chest x-ray. Right lung is otherwise clear. Heart size and vascularity are normal. IMPRESSION: 1. Small left apical pneumothorax. 2. Cavitary lesion in the right lung apex noted on prior CT scan is not well seen on the current chest x-ray. Electronically Signed   By: Lorriane Shire M.D.   On: 07/20/2017 16:25   Dg Chest Port 1 View  Result Date: 07/20/2017 CLINICAL DATA:  Left chest tube EXAM: PORTABLE CHEST 1 VIEW COMPARISON:  07/19/2017 FINDINGS: Left chest tube remains in place. Very small left basilar pneumothorax noted. Scarring in the apices. Heart is normal size. No acute bony abnormality. IMPRESSION: Left chest tube remains in stable position. Small  left basilar pneumothorax, similar to prior study. Biapical scarring. Electronically Signed   By: Rolm Baptise M.D.   On: 07/20/2017 09:52   Dg Chest Port 1 View  Result Date: 07/19/2017 CLINICAL DATA:  Pneumothorax. EXAM: PORTABLE CHEST 1 VIEW COMPARISON:  Radiograph of same day. FINDINGS: The heart size and mediastinal contours are within normal limits. Atherosclerosis of thoracic aorta is noted. Right lung is clear. Left-sided chest tube is unchanged in position. Minimal left apical pneumothorax is noted. The visualized skeletal structures are unremarkable. IMPRESSION: Left-sided chest tube is unchanged in position. Minimal left apical pneumothorax is noted. Electronically Signed   By: Marijo Conception, M.D.   On: 07/19/2017 18:22   Dg Chest Port 1 View  Result Date: 07/19/2017 CLINICAL DATA:  Left chest tube, shortness of Breath EXAM: PORTABLE CHEST 1 VIEW COMPARISON:  07/18/2017 FINDINGS: Left chest tube remains in place, unchanged. No pneumothorax. Heart is normal size. No focal airspace opacity or effusion. IMPRESSION: Left chest tube without pneumothorax.  No acute findings. Electronically Signed   By: Rolm Baptise M.D.   On: 07/19/2017 09:42   Dg Chest Portable 1 View  Result Date: 07/18/2017 CLINICAL DATA:  Chest tube placement EXAM: PORTABLE CHEST 1 VIEW COMPARISON:  July 18, 2017 FINDINGS: A chest tube is been placed on the left in the interval. The pigtail is located in the periphery of the left hemithorax. A tiny amount of air likely remains in the pleural space near the costophrenic angle. However, the moderate left-sided pneumothorax on the comparison study has otherwise resolved. The nodular density described in the left mid lung is partially obscured on this study by an EKG lead. No other interval changes. IMPRESSION: 1. Near complete resolution of the left-sided pneumothorax after placement of the chest tube. Of note, the pigtail of chest tube projects over the periphery of the left mid  hemithorax. 2. The previously identified lung nodule in the mid left lung was better seen on the comparison study and is partially obscured by an EKG lead today. See the previous study for recommendations. Electronically Signed   By: Dorise Bullion III M.D   On: 07/18/2017 16:16    Microbiology: No results found for this or any previous visit (from the past 240 hour(s)).   Labs: Basic Metabolic Panel: Recent Labs  Lab 07/18/17 1223 07/19/17 0410 07/20/17 0442 07/21/17 0426  NA 136 138 137 138  K 4.6  4.8 4.1 4.1  CL 99* 98* 99* 100*  CO2 23 29 27 23   GLUCOSE 149* 102* 112* 85  BUN 8 7 9 14   CREATININE 0.56 0.55 0.54 0.55  CALCIUM 9.6 9.2 9.0 9.3   Liver Function Tests: No results for input(s): AST, ALT, ALKPHOS, BILITOT, PROT, ALBUMIN in the last 168 hours. No results for input(s): LIPASE, AMYLASE in the last 168 hours. No results for input(s): AMMONIA in the last 168 hours. CBC: Recent Labs  Lab 07/18/17 1223 07/19/17 0410 07/20/17 0442 07/21/17 0426  WBC 9.0 8.8 8.0 8.1  HGB 14.9 13.7 12.9 13.5  HCT 44.9 41.8 39.9 41.9  MCV 104.2* 104.5* 104.7* 105.3*  PLT 280 236 227 226   Cardiac Enzymes: No results for input(s): CKTOTAL, CKMB, CKMBINDEX, TROPONINI in the last 168 hours. BNP: BNP (last 3 results) No results for input(s): BNP in the last 8760 hours.  ProBNP (last 3 results) No results for input(s): PROBNP in the last 8760 hours.  CBG: No results for input(s): GLUCAP in the last 168 hours.     Signed:  Domenic Polite MD.  [facility] 07/23/2017, 11:31 AM

## 2017-07-27 ENCOUNTER — Telehealth: Payer: Self-pay | Admitting: Internal Medicine

## 2017-07-27 NOTE — Telephone Encounter (Signed)
MW patient is requesting a few more days absence from work. Is this ok to do, thanks!

## 2017-07-27 NOTE — Telephone Encounter (Signed)
Pt is returning call. CB is [phone]

## 2017-07-27 NOTE — Telephone Encounter (Signed)
Called patient, unable to reach, unable to leave voicemail. Will call back

## 2017-07-27 NOTE — Telephone Encounter (Signed)
Pt is aware of below message and voiced her understanding.  Pt has pending apt with MW on 08/08/17. Pt would like to return to work on 08/01/17 vs after 08/08/17.  MW please advise if okay for pt to return to work on 08/01/17 or move apt up. Thanks.

## 2017-07-27 NOTE — Telephone Encounter (Signed)
Attempted to call pt but no answer.   Left message for pt to return our call x1 

## 2017-07-27 NOTE — Telephone Encounter (Signed)
Fine with me - be sure has ov w/in next week and just let her stay until then and we'll address it at that point

## 2017-07-27 NOTE — Telephone Encounter (Signed)
Ok

## 2017-07-28 NOTE — Telephone Encounter (Signed)
Patient returned call, [phone].  Asks for work note to be faxed to [employer] at  [phone] ATTN: Mardene Celeste.

## 2017-07-28 NOTE — Telephone Encounter (Signed)
Letter has been faxed. Will close this message.

## 2017-07-28 NOTE — Telephone Encounter (Signed)
lmtcb x2 for pt. 

## 2017-08-01 ENCOUNTER — Telehealth: Payer: Self-pay | Admitting: Internal Medicine

## 2017-08-01 NOTE — Telephone Encounter (Signed)
Rec'd disability forms via fax from [place] to [employer] via interoffice mail-pr

## 2017-08-08 ENCOUNTER — Telehealth: Payer: Self-pay | Admitting: Internal Medicine

## 2017-08-08 ENCOUNTER — Ambulatory Visit (INDEPENDENT_AMBULATORY_CARE_PROVIDER_SITE_OTHER)
Admission: RE | Admit: 2017-08-08 | Discharge: 2017-08-08 | Disposition: A | Discharge status: Home / Self Care | Payer: BC Managed Care – PPO | Source: Ambulatory Visit | Attending: Adult Health | Admitting: Adult Health

## 2017-08-08 ENCOUNTER — Encounter: Payer: Self-pay | Admitting: Adult Health

## 2017-08-08 ENCOUNTER — Ambulatory Visit: Payer: BC Managed Care – PPO | Admitting: Adult Health

## 2017-08-08 DIAGNOSIS — F17210 Nicotine dependence, cigarettes, uncomplicated: Secondary | ICD-10-CM

## 2017-08-08 DIAGNOSIS — J449 Chronic obstructive pulmonary disease, unspecified: Secondary | ICD-10-CM

## 2017-08-08 DIAGNOSIS — J9601 Acute respiratory failure with hypoxia: Secondary | ICD-10-CM | POA: Diagnosis not present

## 2017-08-08 NOTE — Telephone Encounter (Signed)
Left message for Darlene [name] to call back.

## 2017-08-08 NOTE — Progress Notes (Signed)
Chart and office note reviewed in detail  > agree with a/p as outlined    

## 2017-08-08 NOTE — Assessment & Plan Note (Signed)
Cont on current regimen   Plan   cont on current regimen .

## 2017-08-08 NOTE — Addendum Note (Signed)
Addended by: Parke Poisson E on: 08/08/2017 05:10 PM   Modules accepted: Orders

## 2017-08-08 NOTE — Assessment & Plan Note (Signed)
Recent PTX resolved with chest tube High risk for recurrence   Plan  Check cxr today .

## 2017-08-08 NOTE — Addendum Note (Signed)
Addended by: Parke Poisson E on: 08/08/2017 05:33 PM   Modules accepted: Orders

## 2017-08-08 NOTE — Assessment & Plan Note (Signed)
Smoking cessation  

## 2017-08-08 NOTE — Progress Notes (Signed)
@Patient  ID: Darlene [name], female    DOB: 08/08/1955, 62 y.o.   MRN: [id number]  Chief Complaint  Patient presents with  . Follow-up    COPD     Referring provider: [name], MD  HPI: 62 year old female active smoker followed for gold 3 COPD  TEST PFT's 02/02/2013   FEV1 1.28 (50%)   p  33% response to saba  with ratio 47 and DLCO 66 corrects to 73  - hfa 75 % 12/29/2012  > 75% 02/02/2013  -   alpha testing 02/02/2013 > MM   - Spirometry 06/22/2017  FEV1 0.97 (39%)  Ratio 46 though < 6 sec exp    08/08/2017 Follow up : COPD , PTX and lung mass Patient presents for a post hospital follow-up.  Patient was admitted earlier this month for a spontaneous pneumothorax.  A chest tube was placed.  Patient was evaluated by CVT S and felt not a surgical candidate for a VATS pleurodesis at this time.  The pneumothorax resolved with chest tube. Since discharge patient is feeling some better.  She has returned back to work . She does secretarial work.  She was set up on Oxygen at home to wear with activity . She needs portable to take to work.  Says ribs sore at times.   Abnormal chest CT with a right upper lobe cavitary lesions and multiple pulmonary nodules. She has been recommended for a repeat PET scan and possible biopsy however patient has refused.  Still smoking discussed cessation .    Allergies  Allergen Reactions  . Codeine Nausea And Vomiting    Immunization History  Administered Date(s) Administered  . Influenza,inj,Quad PF,6+ Mos 03/14/2017  . Pneumococcal Polysaccharide-23 07/22/2011, 06/21/2017    Past Medical History:  Diagnosis Date  . Anxiety   . COPD (chronic obstructive pulmonary disease) ([place])   . Emphysema   . Substance abuse ([place])    tobacco dependance    Tobacco History: Social History   Tobacco Use  Smoking Status Current Every Day Smoker  . Packs/day: 1.00  . Years: 42.00  . Pack years: 42.00  . Types: Cigarettes  Smokeless Tobacco Never  Used  Tobacco Comment   quit Jan 2019 for 15 days and has started back up   Ready to quit: No Counseling given: Yes Comment: quit Jan 2019 for 15 days and has started back up   Outpatient Encounter Medications as of 08/08/2017  Medication Sig  . acetaminophen (TYLENOL) 500 MG tablet Take 1,000 mg by mouth every 6 (six) hours as needed. Patient states she is taking 1000mg  about every 4 hours  . albuterol (PROAIR HFA) 108 (90 BASE) MCG/ACT inhaler Inhale 2 puffs into the lungs every 6 (six) hours as needed for wheezing or shortness of breath.  . ALPRAZolam (XANAX) 0.25 MG tablet Take 0.25 mg by mouth daily.   . Artificial Tear Ointment (DRY EYES OP) Place 1 drop into both eyes daily as needed.  . budesonide-formoterol (SYMBICORT) 160-4.5 MCG/ACT inhaler Inhale 2 puffs into the lungs 2 (two) times daily.  . DULoxetine (CYMBALTA) 60 MG capsule Take 60 mg by mouth daily.  Marland Kitchen guaiFENesin (MUCINEX) 600 MG 12 hr tablet Take 2 tablets (1,200 mg total) by mouth 2 (two) times daily. (Patient taking differently: Take 1,200 mg by mouth daily. )  . nicotine (NICODERM CQ - DOSED IN MG/24 HOURS) 21 mg/24hr patch Place 1 patch (21 mg total) onto the skin daily.  . Tiotropium Bromide Monohydrate (SPIRIVA RESPIMAT)  2.5 MCG/ACT AERS Inhale 2 puffs into the lungs daily.  . traMADol (ULTRAM) 50 MG tablet Take 50 mg by mouth every 6 (six) hours as needed. for pain  . Vitamin D, Ergocalciferol, (DRISDOL) 50000 units CAPS capsule Take 50,000 Units by mouth every 14 (fourteen) days.   . feeding supplement, ENSURE ENLIVE, (ENSURE ENLIVE) LIQD Take 237 mLs by mouth 2 (two) times daily between meals. (Patient not taking: Reported on 08/08/2017)  . [DISCONTINUED] Cholecalciferol (VITAMIN D) 2000 units tablet Take 6,000 Units by mouth daily.   No facility-administered encounter medications on file as of 08/08/2017.      Review of Systems  Constitutional:   No  weight loss, night sweats,  Fevers, chills,  +fatigue, or   lassitude.  HEENT:   No headaches,  Difficulty swallowing,  Tooth/dental problems, or  Sore throat,                No sneezing, itching, ear ache, nasal congestion, post nasal drip,   CV:  No chest pain,  Orthopnea, PND, swelling in lower extremities, anasarca, dizziness, palpitations, syncope.   GI  No heartburn, indigestion, abdominal pain, nausea, vomiting, diarrhea, change in bowel habits, loss of appetite, bloody stools.   Resp:    No chest wall deformity  Skin: no rash or lesions.  GU: no dysuria, change in color of urine, no urgency or frequency.  No flank pain, no hematuria   MS:  No joint pain or swelling.  No decreased range of motion.  No back pain.    Physical Exam  BP 112/72 (BP Location: Left Arm, Cuff Size: Normal)   Pulse 99   Ht 5\' 4"  (1.626 m)   Wt 88 lb 12.8 oz (40.3 kg)   SpO2 94%   BMI 15.24 kg/m   GEN: A/Ox3; pleasant , NAD, frail    HEENT:  /AT,  EACs-clear, TMs-wnl, NOSE-clear, THROAT-clear, no lesions, no postnasal drip or exudate noted.   NECK:  Supple w/ fair ROM; no JVD; normal carotid impulses w/o bruits; no thyromegaly or nodules palpated; no lymphadenopathy.    RESP  Decreased BS in bases , . no accessory muscle use, no dullness to percussion  CARD:  RRR, no m/r/g, no peripheral edema, pulses intact, no cyanosis or clubbing.  GI:   Soft & nt; nml bowel sounds; no organomegaly or masses detected.   Musco: Warm bil, no deformities or joint swelling noted.   Neuro: alert, no focal deficits noted.    Skin: Warm, no lesions or rashes    Lab Results:  CBC  BMET  BNP No results found for: BNP  ProBNP No results found for: PROBNP  Imaging: Dg Chest 2 View  Result Date: 07/23/2017 CLINICAL DATA:  Follow-up left pneumothorax. EXAM: CHEST  2 VIEW COMPARISON:  07/22/2017 and prior radiographs FINDINGS: No definite pneumothorax identified on this study. The cardiomediastinal silhouette is unremarkable. Left mid lung nodule is again  identified. No other changes identified. IMPRESSION: No definite pneumothorax.  No other significant change. Electronically Signed   By: Margarette Canada M.D.   On: 07/23/2017 09:22   Dg Chest 2 View  Result Date: 07/18/2017 CLINICAL DATA:  Chest pain, shortness of breath. EXAM: CHEST  2 VIEW COMPARISON:  Radiograph June 21, 2016. FINDINGS: The heart size and mediastinal contours are within normal limits. Moderate size left pneumothorax is noted on the order of approximately 20%. Right lung is clear. Hyperexpansion of the lungs is noted. No definite pleural effusion is noted. Nodular  density is noted in the left midlung. The visualized skeletal structures are unremarkable. IMPRESSION: Moderate size left pneumothorax is noted. Critical Value/emergent results were called by telephone at the time of interpretation on 07/18/2017 at 12:56 pm to Dr. Marda Stalker , who verbally acknowledged these results. Nodular density is noted in left midlung; CT scan of the chest is recommended further evaluation. Hyperexpansion of the lungs is noted suggesting chronic obstructive pulmonary disease. Electronically Signed   By: Marijo Conception, M.D.   On: 07/18/2017 12:56   Dg Chest Port 1 View  Result Date: 07/22/2017 CLINICAL DATA:  Dyspnea.  Recent pneumothorax. EXAM: PORTABLE CHEST 1 VIEW COMPARISON:  Chest radiograph 07/22/2017 and before FINDINGS: Trace residual left apical pneumothorax. Nodular opacity in the left mid lung is unchanged. Unchanged left axillary subcutaneous emphysema. The lungs are clear. No pleural effusion. Normal cardiomediastinal contours. IMPRESSION: Trace residual left apical pneumothorax.  The Electronically Signed   By: Ulyses Jarred M.D.   On: 07/22/2017 16:41   Dg Chest Port 1 View  Result Date: 07/22/2017 CLINICAL DATA:  Follow-up pneumothorax EXAM: PORTABLE CHEST 1 VIEW COMPARISON:  07/21/2017 FINDINGS: Cardiac shadow is stable. The lungs are well aerated bilaterally. Previously seen nodular  density in the left mid lung is again seen and stable. Left chest tube has been fully removed. No recurrent pneumothorax is seen. No sizable effusion is noted. IMPRESSION: No significant recurrent pneumothorax identified. Stable nodule in the left mid lung. Electronically Signed   By: Inez Catalina M.D.   On: 07/22/2017 08:28   Dg Chest Port 1 View  Result Date: 07/21/2017 CLINICAL DATA:  Left-sided chest pain shoulder pain. EXAM: PORTABLE CHEST 1 VIEW COMPARISON:  07/20/2017 FINDINGS: Cardiomediastinal silhouette is normal. Mediastinal contours appear intact. Calcific atherosclerotic disease of the aorta. There is a tiny residual left pneumothorax. The left-sided chest tube has displaced and is seen within the soft tissues of the left chest wall. There is a small amount of subcutaneous emphysema. Redemonstration of 9 mm left lower lobe soft tissue pulmonary nodule, architectural distortion and scarring in bilateral lung apices. The cavitating lesion demonstrated by recent CT in the right upper lobe is not well seen radiographically. Osseous structures are without acute abnormality. Soft tissues are grossly normal. IMPRESSION: Tiny residual left pneumothorax. Left chest tube has displaced and is seen overlying the soft tissues of the left chest wall with associated subcutaneous emphysema. Electronically Signed   By: Fidela Salisbury M.D.   On: 07/21/2017 13:45   Dg Chest Port 1 View  Result Date: 07/21/2017 CLINICAL DATA:  Chest tube in place.  Recent pneumothorax. EXAM: PORTABLE CHEST 1 VIEW COMPARISON:  July 20, 2017 chest radiograph and chest CT June 18, 2017 FINDINGS: Chest tube remains on the left. There is a minimal left apical pneumothorax without tension component. The ill-defined nodular opacity in the left upper lobe is less apparent compared to 1 day prior. There is mild left base atelectasis. On the right, there is ill-defined opacity in the right upper lobe near the apex. This opacity has  been noted previously. The cavitation in this area noted on prior CT is not well seen by radiography. Right lung otherwise is clear. Heart size and pulmonary vascularity normal. No adenopathy. There is aortic atherosclerosis. Bones appear osteoporotic. IMPRESSION: 1. Chest tube present on the left. Minimal left apical pneumothorax. Mild left base atelectasis. 2. Ill-defined opacity right upper lobe near the apex. The cavitation noted in this area on prior CT is not well  seen by radiography. Right lung is otherwise clear. 3. There is aortic atherosclerosis. Heart size normal. No adenopathy evident. 4.  Bones osteoporotic. Aortic Atherosclerosis (ICD10-I70.0). Electronically Signed   By: Lowella Grip III M.D.   On: 07/21/2017 09:21   Dg Chest Port 1 View  Result Date: 07/20/2017 CLINICAL DATA:  Left pneumothorax.  Chest tube in place. EXAM: PORTABLE CHEST 1 VIEW COMPARISON:  Chest x-rays dated 07/19/2017, 07/18/2017, and 06/21/2017 and chest CT dated 06/18/2017 FINDINGS: There is a small left apical pneumothorax. No visible residual left basal pneumothorax. Small chest tube remains in place. Calcified granuloma in the superior segment of the left lower lobe is again noted. Irregular cavitary lesion in the right lung apex is not well seen on the current chest x-ray. Right lung is otherwise clear. Heart size and vascularity are normal. IMPRESSION: 1. Small left apical pneumothorax. 2. Cavitary lesion in the right lung apex noted on prior CT scan is not well seen on the current chest x-ray. Electronically Signed   By: Lorriane Shire M.D.   On: 07/20/2017 16:25   Dg Chest Port 1 View  Result Date: 07/20/2017 CLINICAL DATA:  Left chest tube EXAM: PORTABLE CHEST 1 VIEW COMPARISON:  07/19/2017 FINDINGS: Left chest tube remains in place. Very small left basilar pneumothorax noted. Scarring in the apices. Heart is normal size. No acute bony abnormality. IMPRESSION: Left chest tube remains in stable position. Small  left basilar pneumothorax, similar to prior study. Biapical scarring. Electronically Signed   By: Rolm Baptise M.D.   On: 07/20/2017 09:52   Dg Chest Port 1 View  Result Date: 07/19/2017 CLINICAL DATA:  Pneumothorax. EXAM: PORTABLE CHEST 1 VIEW COMPARISON:  Radiograph of same day. FINDINGS: The heart size and mediastinal contours are within normal limits. Atherosclerosis of thoracic aorta is noted. Right lung is clear. Left-sided chest tube is unchanged in position. Minimal left apical pneumothorax is noted. The visualized skeletal structures are unremarkable. IMPRESSION: Left-sided chest tube is unchanged in position. Minimal left apical pneumothorax is noted. Electronically Signed   By: Marijo Conception, M.D.   On: 07/19/2017 18:22   Dg Chest Port 1 View  Result Date: 07/19/2017 CLINICAL DATA:  Left chest tube, shortness of Breath EXAM: PORTABLE CHEST 1 VIEW COMPARISON:  07/18/2017 FINDINGS: Left chest tube remains in place, unchanged. No pneumothorax. Heart is normal size. No focal airspace opacity or effusion. IMPRESSION: Left chest tube without pneumothorax.  No acute findings. Electronically Signed   By: Rolm Baptise M.D.   On: 07/19/2017 09:42   Dg Chest Portable 1 View  Result Date: 07/18/2017 CLINICAL DATA:  Chest tube placement EXAM: PORTABLE CHEST 1 VIEW COMPARISON:  July 18, 2017 FINDINGS: A chest tube is been placed on the left in the interval. The pigtail is located in the periphery of the left hemithorax. A tiny amount of air likely remains in the pleural space near the costophrenic angle. However, the moderate left-sided pneumothorax on the comparison study has otherwise resolved. The nodular density described in the left mid lung is partially obscured on this study by an EKG lead. No other interval changes. IMPRESSION: 1. Near complete resolution of the left-sided pneumothorax after placement of the chest tube. Of note, the pigtail of chest tube projects over the periphery of the left mid  hemithorax. 2. The previously identified lung nodule in the mid left lung was better seen on the comparison study and is partially obscured by an EKG lead today. See the previous study for  recommendations. Electronically Signed   By: Dorise Bullion III M.D   On: 07/18/2017 16:16     Assessment & Plan:   Recurrent spontaneous pneumothorax Recent PTX resolved with chest tube High risk for recurrence   Plan  Check cxr today .   COPD GOLD III  Cont on current regimen   Plan   cont on current regimen .    Cigarette smoker Smoking cessation   Acute respiratory failure with hypoxia ([place]) Resolved  On home O2 now  POC order      Rexene Edison, NP 08/08/2017

## 2017-08-08 NOTE — Telephone Encounter (Signed)
[place] with me but that's something that should have been addressed by the discharge planner as I have not seen her since the last admit

## 2017-08-08 NOTE — Telephone Encounter (Signed)
Called Darlene [name] letting her know MW said it was fine for pt to have palliative care but it should have been addressed by the doctor when she was last at the hospital due to him not seeing her since the last admit.  Darlene [name] expressed understanding and said she would take care of calling the palliative people and they would contact the doctor for a signature.  Nothing further needed at this current time.

## 2017-08-08 NOTE — Patient Instructions (Addendum)
Continue on current regimen  Chest xray today  Follow up with [name]  In 6 weeks and As needed   Work on not smoking .  Continue on Oxygen 2l/m with activity .  Please contact office for sooner follow up if symptoms do not improve or worsen or seek emergency care

## 2017-08-08 NOTE — Telephone Encounter (Signed)
Spoke with Aron Baba., she would like to see if MW would be ok with Palliative Care for pt due to her having 2 hospitalizations in the last month. She feels she may need additional support to prevent hospitalizations, on the other hand she can have someone to call and talk to for support if she has any problems. Ok for them to proceed with palliative care?  MW please advise. She also stated this is a program given at no charge to the patient.    Patient Instructions by Tanda Rockers, MD at 06/22/2017 3:15 PM   Author: Tanda Rockers, MD Author Type: Physician Filed: 06/22/2017 4:52 PM  Note Status: Addendum Cosign: Cosign Not Required Encounter Date: 06/22/2017  Editor: Tanda Rockers, MD (Physician)  Prior Versions: 1. Tanda Rockers, MD (Physician) at 06/22/2017 4:37 PM - Signed    Continue symbicort 160 Take 2 puffs first thing in am and then another 2 puffs about 12 hours later.  Add spiriva 2 pffs each am  - call if insurance won't cover with list of alternatives   Only use your albuterol as a rescue medication to be used if you can't catch your breath by resting or doing a relaxed purse lip breathing pattern.  - The less you use it, the better it will work when you need it. - Ok to use up to 2 puffs  every 4 hours if you must but call for immediate appointment if use goes up over your usual need - Don't leave home without it !!  (think of it like the spare tire for your car)    You have severe copd and should consider filing for full disability at this point   Congratulations on not smoking, it's the most important aspect of your care  Please schedule a follow up office visit in 4 weeks, sooner if needed with cxr on retur

## 2017-08-08 NOTE — Assessment & Plan Note (Signed)
Resolved  On home O2 now  POC order

## 2017-08-09 ENCOUNTER — Telehealth: Payer: Self-pay | Admitting: Adult Health

## 2017-08-09 DIAGNOSIS — J449 Chronic obstructive pulmonary disease, unspecified: Secondary | ICD-10-CM

## 2017-08-09 DIAGNOSIS — R64 Cachexia: Principal | ICD-10-CM

## 2017-08-09 NOTE — Telephone Encounter (Signed)
Spoke to [place].  Order was sent to them that states pt needs 4 liter pulse dose Inogen.  They do not provide Inogen but they can do Simply Go Mini.  Pt will need to be brought in and walked to qualify.  TP's note states 2 liters.  If pt is needing 2 liters nothing is needed.

## 2017-08-09 NOTE — Telephone Encounter (Signed)
Called Jason & left vm for him to call me back.

## 2017-08-10 ENCOUNTER — Telehealth: Payer: Self-pay | Admitting: Internal Medicine

## 2017-08-10 NOTE — Telephone Encounter (Signed)
Forms were received from [employer] on 08/01/17. Patrice forwarded the forms to [employer] per our office policy on filling out these types of forms. Called [employer] and spoke with Energy manager. Advised her of our office policy on these types of forms. She updated their system. Nothing further was needed at this time.

## 2017-08-10 NOTE — Progress Notes (Signed)
Was able to talk to the patient regarding their results.  They verbalized an understanding of what was discussed. No further questions at this time. 

## 2017-08-11 NOTE — Telephone Encounter (Signed)
Spoke with Darlene [name]. Pt qualified at 4lpm pulsed while pt was seen on 2.25.19 with TP. Pt states she would like to use Inogen system if this is an option. Will speak with out PCC's to see if pt could keep her home concentrator and use POC through Inogen and still be covered by insurance.

## 2017-08-12 NOTE — Telephone Encounter (Signed)
Darlene [name] did you decide on what we were going to do? Do you want to qualify her for a Simply Go Mini here in the office?

## 2017-08-12 NOTE — Telephone Encounter (Signed)
As far as I am aware insurance will cover one or the other not both Darlene [name]

## 2017-08-12 NOTE — Telephone Encounter (Signed)
Called and spoke to pt. Informed her she could Inogen and see if there is a system she would like. Pt veberalized her understanding and states she will also call her insurance company to see if they would cover Inogen. Gave pt the number to [place], [phone]. Will call pt on Monday 08/15/2017 to see if she would like [place] to place an order for Inogen or simply go mini.

## 2017-08-15 ENCOUNTER — Telehealth: Payer: Self-pay | Admitting: Internal Medicine

## 2017-08-15 NOTE — Telephone Encounter (Signed)
Forms placed in Dr Gustavus Bryant lookat

## 2017-08-15 NOTE — Telephone Encounter (Signed)
Pt is calling back [phone]

## 2017-08-15 NOTE — Telephone Encounter (Addendum)
ATC pt- unable to leave vm, due to mailbox being full. Will call back.  

## 2017-08-15 NOTE — Telephone Encounter (Signed)
LMTCB

## 2017-08-15 NOTE — Telephone Encounter (Signed)
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  If she's already returned, then need rtw date

## 2017-08-15 NOTE — Telephone Encounter (Signed)
lmtcb for pt to see what how she would like to proceed.

## 2017-08-16 ENCOUNTER — Other Ambulatory Visit: Payer: Self-pay

## 2017-08-16 ENCOUNTER — Emergency Department (HOSPITAL_COMMUNITY): Payer: BC Managed Care – PPO

## 2017-08-16 ENCOUNTER — Emergency Department (HOSPITAL_COMMUNITY)
Admission: EM | Admit: 2017-08-16 | Discharge: 2017-08-16 | Disposition: A | Discharge status: Home / Self Care | Payer: BC Managed Care – PPO | Attending: Emergency Medicine | Admitting: Emergency Medicine

## 2017-08-16 ENCOUNTER — Telehealth: Payer: Self-pay | Admitting: Internal Medicine

## 2017-08-16 ENCOUNTER — Encounter (HOSPITAL_COMMUNITY): Payer: Self-pay | Admitting: Emergency Medicine

## 2017-08-16 DIAGNOSIS — R0602 Shortness of breath: Secondary | ICD-10-CM | POA: Diagnosis present

## 2017-08-16 DIAGNOSIS — J441 Chronic obstructive pulmonary disease with (acute) exacerbation: Secondary | ICD-10-CM | POA: Insufficient documentation

## 2017-08-16 DIAGNOSIS — F17210 Nicotine dependence, cigarettes, uncomplicated: Secondary | ICD-10-CM | POA: Insufficient documentation

## 2017-08-16 DIAGNOSIS — Z79899 Other long term (current) drug therapy: Secondary | ICD-10-CM | POA: Diagnosis not present

## 2017-08-16 LAB — CBC WITH DIFFERENTIAL/PLATELET
Basophils Absolute: 0.1 10*3/uL (ref 0.0–0.1)
Basophils Relative: 1 %
Eosinophils Absolute: 0.2 10*3/uL (ref 0.0–0.7)
Eosinophils Relative: 2 %
HEMATOCRIT: 41.8 % (ref 36.0–46.0)
Hemoglobin: 13.6 g/dL (ref 12.0–15.0)
LYMPHS ABS: 1.7 10*3/uL (ref 0.7–4.0)
LYMPHS PCT: 17 %
MCH: 33.5 pg (ref 26.0–34.0)
MCHC: 32.5 g/dL (ref 30.0–36.0)
MCV: 103 fL — AB (ref 78.0–100.0)
MONO ABS: 0.9 10*3/uL (ref 0.1–1.0)
MONOS PCT: 9 %
NEUTROS ABS: 7.4 10*3/uL (ref 1.7–7.7)
Neutrophils Relative %: 71 %
Platelets: 344 10*3/uL (ref 150–400)
RBC: 4.06 MIL/uL (ref 3.87–5.11)
RDW: 13.1 % (ref 11.5–15.5)
WBC: 10.3 10*3/uL (ref 4.0–10.5)

## 2017-08-16 LAB — HEPATIC FUNCTION PANEL
ALT: 8 U/L — AB (ref 14–54)
AST: 20 U/L (ref 15–41)
Albumin: 3.3 g/dL — ABNORMAL LOW (ref 3.5–5.0)
Alkaline Phosphatase: 72 U/L (ref 38–126)
BILIRUBIN DIRECT: 0.2 mg/dL (ref 0.1–0.5)
BILIRUBIN TOTAL: 1.4 mg/dL — AB (ref 0.3–1.2)
Indirect Bilirubin: 1.2 mg/dL — ABNORMAL HIGH (ref 0.3–0.9)
Total Protein: 6.4 g/dL — ABNORMAL LOW (ref 6.5–8.1)

## 2017-08-16 LAB — BASIC METABOLIC PANEL
ANION GAP: 11 (ref 5–15)
BUN: 5 mg/dL — ABNORMAL LOW (ref 6–20)
CO2: 27 mmol/L (ref 22–32)
Calcium: 9.1 mg/dL (ref 8.9–10.3)
Chloride: 100 mmol/L — ABNORMAL LOW (ref 101–111)
Creatinine, Ser: 0.51 mg/dL (ref 0.44–1.00)
GFR calc Af Amer: >60 mL/min (ref >60)
GFR calc non Af Amer: >60 mL/min (ref >60)
GLUCOSE: 120 mg/dL — AB (ref 65–99)
POTASSIUM: 3.6 mmol/L (ref 3.5–5.1)
Sodium: 138 mmol/L (ref 135–145)

## 2017-08-16 LAB — LIPASE, BLOOD: LIPASE: 20 U/L (ref 11–51)

## 2017-08-16 LAB — I-STAT TROPONIN, ED
Troponin i, poc: 0 ng/mL (ref 0.00–0.08)
Troponin i, poc: 0 ng/mL (ref 0.00–0.08)

## 2017-08-16 MED ORDER — FENTANYL CITRATE (PF) 100 MCG/2ML IJ SOLN
50.0000 ug | Freq: Once | INTRAMUSCULAR | Status: AC
  Administered 2017-08-16: 50 ug via INTRAVENOUS
  Filled 2017-08-16: qty 2

## 2017-08-16 MED ORDER — IOPAMIDOL (ISOVUE-370) INJECTION 76%
100.0000 mL | Freq: Once | INTRAVENOUS | Status: AC | PRN
  Administered 2017-08-16: 100 mL via INTRAVENOUS

## 2017-08-16 MED ORDER — PREDNISONE 50 MG PO TABS: 50.0000 mg | ORAL_TABLET | Freq: Every day | ORAL | 0 refills | Status: AC

## 2017-08-16 MED ORDER — SODIUM CHLORIDE 0.9 % IV BOLUS (SEPSIS)
1000.0000 mL | Freq: Once | INTRAVENOUS | Status: AC
  Administered 2017-08-16: 1000 mL via INTRAVENOUS

## 2017-08-16 NOTE — Telephone Encounter (Signed)
lmtcb x2 for pt. 

## 2017-08-16 NOTE — Telephone Encounter (Signed)
Called and spoke with patient, advised her of MW response and she verbalized understanding.

## 2017-08-16 NOTE — Telephone Encounter (Signed)
She can certainly request this once she is appropriately triaged and they will contact the approp team which may or may not be ours, depending on the circumstances

## 2017-08-16 NOTE — ED Triage Notes (Signed)
Sharp chest pain and back pain midlde of the night  States feels this way when she has a collapsed lung , pt states she cont to smoked

## 2017-08-16 NOTE — Telephone Encounter (Signed)
Pt is returning call about chest pain. Cb is [phone] home number.

## 2017-08-16 NOTE — ED Provider Notes (Signed)
Patient placed in Quick Look pathway, seen and evaluated   Chief Complaint: collapsed lung  HPI:   Pt with hx of spontaneous pneumothorax, recent hospitalization in feb. States has been increasing SOB for last 4 days and developed left sided CP yesterday, similar to prior when had pneumo.   ROS: positive for chest pain, positive for SOB.  Physical Exam:   Gen: No distress  Neuro: Awake and Alert  Skin: Warm    Focused Exam: Decreased breath sounds on left upper lung. Increased expiratory phase. Regular HR and rhythm.    Initiation of care has begun. The patient has been counseled on the process, plan, and necessity for staying for the completion/evaluation, and the remainder of the medical screening examination   pt in ED with possible recurrent pneumothorax, and left sided chest pain. CXR ordered, labs ordered.    Jeannett Senior, PA-C 08/16/17 [place], MD 08/17/17 1512

## 2017-08-16 NOTE — Telephone Encounter (Signed)
Pt states she returned back to work on  08/08/17. Pt also stated that she feels her lung may be collapsed again. Pt reports of left upper side back and chest pain & increased sob x3d. Pt states she will be going to to ED for eval. Pt is requesting that pulm see her while in ED and place tube if needed, as ED doctor placed tube previously.  MW please advise. Thanks.  AVS 08/08/17:    Return in about 6 weeks (around 09/19/2017) for Follow up with [name].  Continue on current regimen  Chest xray today  Follow up with [name]  In 6 weeks and As needed   Work on not smoking .  Continue on Oxygen 2l/m with activity .  Please contact office for sooner follow up if symptoms do not improve or worsen or seek emergency care

## 2017-08-16 NOTE — Discharge Instructions (Signed)
Your workup today did not show evidence of acute pneumonia, collapsed lung, or pulmonary embolism.  He did appear to have some inflammation in your lungs and you maintained her oxygen saturations with your home oxygen amount.  Given your exam I am concerned he may have had worsening of your COPD.  Please use the steroids for the next few days and follow-up with your primary doctor.  You may also use your inhalers at home.  You have demonstrated stability and we feel you are safe for discharge home.  If any symptoms change or worsen, please return immediately to the nearest emergency department.

## 2017-08-16 NOTE — Telephone Encounter (Signed)
Please see phone noted dated as 08/15/17.

## 2017-08-16 NOTE — Telephone Encounter (Signed)
ATC pt- unable to leave vm, due to mailbox not being set up.  Will call back

## 2017-08-16 NOTE — Telephone Encounter (Signed)
Rec'd incomplete forms back from [place] - will hold at my desk until patient calls back. - pr

## 2017-08-16 NOTE — ED Notes (Signed)
Wbc: 10.18 Rbc: 4.08 Hgb: 13.6 Hct 42.1 MCV: 103.2 Mch: 33.3 McHc: 32.3 Plt: 334 Rdw-sd: 49.4 rdw-cv: 13.1 Mpv: 9.5 Neut: 7.8 Lymph: 1.68 Mono: 0.90 Eo: 0.17 Baso: 0.05 IG: 0.03 NRBC: 0.00 Ret: 1.15 IRF: 5.2

## 2017-08-16 NOTE — ED Provider Notes (Signed)
[facility] Provider Note   CSN: [id number] Arrival date & time: 08/16/17  1320     History   Chief Complaint Chief Complaint  Patient presents with  . Shortness of Breath    HPI Darlene [name] is a 62 y.o. female.  The history is provided by the patient and medical records. No language interpreter was used.  Shortness of Breath  This is a recurrent problem. The average episode lasts 4 days. The problem occurs continuously.The current episode started more than 2 days ago. The problem has not changed since onset.Associated symptoms include neck pain, cough, sputum production and chest pain. Pertinent negatives include no fever, no headaches, no rhinorrhea, no hemoptysis, no wheezing, no syncope, no vomiting, no abdominal pain, no rash, no leg pain, no leg swelling and no claudication. Risk factors include smoking. She has tried nothing for the symptoms. The treatment provided no relief. She has had prior hospitalizations. Associated medical issues include COPD, chronic lung disease and recent surgery (recent chest tubes). Associated medical issues do not include PE, CAD, heart failure, past MI or DVT.    Past Medical History:  Diagnosis Date  . Anxiety   . COPD (chronic obstructive pulmonary disease) ([place])   . Emphysema   . Substance abuse ([place])    tobacco dependance    Patient Active Problem List   Diagnosis Date Noted  . Recurrent spontaneous pneumothorax 07/18/2017  . Nodule of apex of right lung 06/18/2017  . Spontaneous pneumothorax on L 06/17/17 06/17/2017  . Acute respiratory failure with hypoxia ([place]) 06/17/2017  . Anxiety disorder 06/17/2017  . Tobacco abuse 06/17/2017  . Severe protein-calorie malnutrition ([place]) 06/17/2017  . 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 Osteoporosis 06/17/2017  . COPD with emphysema ([place]) 06/17/2017  . Anxiety   . COPD (chronic obstructive pulmonary disease) ([place])   . Substance  abuse ([place])   . Pulmonary nodules 07/10/2014  . Cough 03/17/2013  . Cigarette smoker 12/30/2012  . COPD GOLD III  12/29/2012    Past Surgical History:  Procedure Laterality Date  . BACK SURGERY    . KNEE SURGERY      OB History    No data available       Home Medications    Prior to Admission medications   Medication Sig Start Date End Date Taking? Authorizing Provider  acetaminophen (TYLENOL) 500 MG tablet Take 500-1,000 mg by mouth every 6 (six) hours as needed (for pain).    Yes [provider]  albuterol (PROAIR HFA) 108 (90 BASE) MCG/ACT inhaler Inhale 2 puffs into the lungs every 6 (six) hours as needed for wheezing or shortness of breath. 02/09/13  Yes Tanda Rockers, MD  ALPRAZolam Duanne Moron) 0.25 MG tablet Take 0.25 mg by mouth daily.    Yes [provider]  budesonide-formoterol (SYMBICORT) 160-4.5 MCG/ACT inhaler Inhale 2 puffs into the lungs 2 (two) times daily. 01/28/17  Yes Tanda Rockers, MD  Calcium Carb-Cholecalciferol (CALCIUM+D3 PO) Take 1 tablet by mouth 2 (two) times a week.   Yes [provider]  DULoxetine (CYMBALTA) 60 MG capsule Take 60 mg by mouth daily.   Yes [provider]  feeding supplement, ENSURE ENLIVE, (ENSURE ENLIVE) LIQD Take 237 mLs by mouth 2 (two) times daily between meals. Patient taking differently: Take 237 mLs by mouth daily as needed (to supplement diet).  06/21/17  Yes Sheikh, Omair Latif, DO  guaiFENesin (MUCINEX) 600 MG 12 hr tablet Take  2 tablets (1,200 mg total) by mouth 2 (two) times daily. Patient taking differently: Take 600 mg by mouth daily.  06/21/17  Yes Sheikh, Omair Latif, DO  Tiotropium Bromide Monohydrate (SPIRIVA RESPIMAT) 2.5 MCG/ACT AERS Inhale 2 puffs into the lungs daily. 06/22/17  Yes Tanda Rockers, MD  traMADol (ULTRAM) 50 MG tablet Take 50 mg by mouth every 6 (six) hours as needed (for pain).  05/15/17  Yes [provider]  Vitamin D, Ergocalciferol, (DRISDOL) 50000 units CAPS  capsule Take 50,000 Units by mouth every 14 (fourteen) days.    Yes [provider]  nicotine (NICODERM CQ - DOSED IN MG/24 HOURS) 21 mg/24hr patch Place 1 patch (21 mg total) onto the skin daily. Patient not taking: Reported on 08/16/2017 06/22/17   Kerney Elbe, DO    Family History Family History  Problem Relation Age of Onset  . Alzheimer's disease Mother   . Cancer Paternal Aunt        colon    Social History Social History   Tobacco Use  . Smoking status: Current Every Day Smoker    Packs/day: 1.00    Years: 42.00    Pack years: 42.00    Types: Cigarettes  . Smokeless tobacco: Never Used  . Tobacco comment: quit Jan 2019 for 15 days and has started back up  Substance Use Topics  . Alcohol use: Yes    Alcohol/week: 3.0 oz    Types: 5 Cans of beer per week    Comment: 3-5 cans three to four timesa week  . Drug use: No     Allergies   Codeine   Review of Systems Review of Systems  Constitutional: Positive for fatigue. Negative for chills, diaphoresis and fever.  HENT: Negative for congestion and rhinorrhea.   Eyes: Negative for visual disturbance.  Respiratory: Positive for cough, sputum production, chest tightness and shortness of breath. Negative for hemoptysis, choking, wheezing and stridor.   Cardiovascular: Positive for chest pain and palpitations. Negative for claudication, leg swelling and syncope.  Gastrointestinal: Negative for abdominal pain, constipation, diarrhea, nausea and vomiting.  Genitourinary: Negative for dysuria, flank pain and frequency.  Musculoskeletal: Positive for neck pain. Negative for back pain and neck stiffness.  Skin: Negative for rash and wound.  Neurological: Negative for light-headedness and headaches.  Psychiatric/Behavioral: Negative for agitation.  All other systems reviewed and are negative.    Physical Exam Updated Vital Signs BP (!) 158/86   Pulse (!) 122   Temp 98.8 F (37.1 C) (Oral)   Resp (!) 22    SpO2 99%   Physical Exam  Constitutional: She is oriented to person, place, and time. She appears well-developed and well-nourished.  Non-toxic appearance. She does not appear ill. No distress.  HENT:  Head: Normocephalic.  Mouth/Throat: Oropharynx is clear and moist. No oropharyngeal exudate.  Eyes: Pupils are equal, round, and reactive to light.  Neck: Normal range of motion.  Cardiovascular: Regular rhythm. Tachycardia present.  No murmur heard. Pulmonary/Chest: Effort normal. No accessory muscle usage or stridor. Tachypnea noted. No respiratory distress. She has no wheezes. She has rhonchi.     She exhibits tenderness (mild).  Abdominal: She exhibits no distension. There is no tenderness.  Musculoskeletal: She exhibits no edema.  Neurological: She is alert and oriented to person, place, and time. No sensory deficit. She exhibits normal muscle tone.  Skin: Capillary refill takes less than 2 seconds. No rash noted. She is not diaphoretic. No erythema.  Psychiatric: She has  a normal mood and affect.  Nursing note and vitals reviewed.    ED Treatments / Results  Labs (all labs ordered are listed, but only abnormal results are displayed) Labs Reviewed  BASIC METABOLIC PANEL - Abnormal; Notable for the following components:      Result Value   Chloride 100 (*)    Glucose, Bld 120 (*)    BUN 5 (*)    All other components within normal limits  CBC WITH DIFFERENTIAL/PLATELET - Abnormal; Notable for the following components:   MCV 103.0 (*)    All other components within normal limits  HEPATIC FUNCTION PANEL - Abnormal; Notable for the following components:   Total Protein 6.4 (*)    Albumin 3.3 (*)    ALT 8 (*)    Total Bilirubin 1.4 (*)    Indirect Bilirubin 1.2 (*)    All other components within normal limits  LIPASE, BLOOD  I-STAT TROPONIN, ED  I-STAT TROPONIN, ED    EKG  EKG Interpretation  Date/Time:  Tuesday August 16 2017 13:40:11 EST Ventricular Rate:  122 PR  Interval:  142 QRS Duration: 72 QT Interval:  328 QTC Calculation: 467 R Axis:   80 Text Interpretation:  Sinus tachycardia with Premature atrial complexes Possible Left atrial enlargement Cannot rule out Anterior infarct , age undetermined Abnormal ECG When compared to prior, similar tachycardia.  No STEMI Confirmed by Antony Blackbird [phone]) on 08/16/2017 4:20:09 PM       Radiology Dg Chest 2 View  Result Date: 08/16/2017 CLINICAL DATA:  Shortness of breath. Left-sided chest pain. Recent pneumonia. EXAM: CHEST  2 VIEW COMPARISON:  08/08/2017 FINDINGS: Hyperinflation. Osteopenia. Midline trachea. Normal heart size. Atherosclerosis in the transverse aorta. No pleural effusion or pneumothorax. Presumed scarring within both apices. No well-defined lobar consolidation. Probable nipple shadow projecting over the left lung base. Nodular density projecting over the left mid lung, likely corresponding to left lower lobe superior segment pulmonary nodule on the prior CT. IMPRESSION: Hyperinflation and biapical scarring. No pneumothorax or other acute superimposed process. Superior segment left lower lobe pulmonary nodule, as on 06/18/2017 CT. Electronically Signed   By: Abigail Miyamoto M.D.   On: 08/16/2017 14:50   Ct Angio Chest Pe W And/or Wo Contrast  Result Date: 08/16/2017 CLINICAL DATA:  Shortness of breath. Tachycardia. Pleuritic chest pain. Productive cough. EXAM: CT ANGIOGRAPHY CHEST WITH CONTRAST TECHNIQUE: Multidetector CT imaging of the chest was performed using the standard protocol during bolus administration of intravenous contrast. Multiplanar CT image reconstructions and MIPs were obtained to evaluate the vascular anatomy. CONTRAST:  164mL ISOVUE-370 IOPAMIDOL (ISOVUE-370) INJECTION 76% COMPARISON:  06/18/2017 noncontrast chest CT FINDINGS: Cardiovascular: Pulmonary arterial opacification is adequate without evidence of emboli. There is mild thoracic aortic atherosclerosis without aneurysm or  dissection. The heart is normal in size, and there is a trace pericardial effusion. Mediastinum/Nodes: No enlarged axillary, mediastinal, or hilar lymph nodes. Grossly unremarkable esophagus and thyroid. Lungs/Pleura: No pleural effusion or pneumothorax. Advanced centrilobular emphysema. Cavitary right upper lobe nodule is decreased in size, now measuring 2.0 x 1.1 cm (series 6, image 20, previously 2.5 x 1.4 cm) with adjacent pleuroparenchymal scarring noted. Unchanged 6 x 4 mm nodule inferiorly in the right upper lobe (series 6, image 63). Unchanged 9 x 7 mm left lower lobe nodule (series 6, image 63). 8 x 7 mm medial left lower lobe nodule on the prior CT has resolved. 3 mm lateral left lower lobe nodule is unchanged (series 6, image 86). 9 x  5 mm anterior left lower lobe nodule has enlarged (series 6, image 70, previously 3 mm). Minimal scarring in the lung bases. Upper Abdomen: No acute abnormality. Musculoskeletal: Unchanged mild compression fractures of multiple thoracic vertebrae. Review of the MIP images confirms the above findings. IMPRESSION: 1. No evidence of pulmonary emboli. 2. Decreased size of cavitary right upper lobe nodule and resolution of 1 left lower lobe nodule. A 9 x 5 mm left lower lobe nodule has enlarged, while other bilateral lung nodules are unchanged. Mixed interval changes in a relatively short time period suggest an infectious/inflammatory etiology. 3. Aortic Atherosclerosis (ICD10-I70.0) and Emphysema (ICD10-J43.9). Electronically Signed   By: Logan Bores M.D.   On: 08/16/2017 18:59    Procedures Procedures (including critical care time)  Medications Ordered in ED Medications  sodium chloride 0.9 % bolus 1,000 mL (0 mLs Intravenous Stopped 08/16/17 2207)  fentaNYL (SUBLIMAZE) injection 50 mcg (50 mcg Intravenous Given 08/16/17 1849)  iopamidol (ISOVUE-370) 76 % injection 100 mL (100 mLs Intravenous Contrast Given 08/16/17 1828)     Initial Impression / Assessment and Plan /  ED Course  I have reviewed the triage vital signs and the nursing notes.  Pertinent labs & imaging results that were available during my care of the patient were reviewed by me and considered in my medical decision making (see chart for details).      Darlene [name] is a 62 y.o. female with a past medical history significant for COPD on intermittent 2 L nasal cannula at home, tobacco abuse, and multiple recent spontaneous pneumothoraces status post chest tube admission 1 month ago who presents with shortness of breath and chest pain.  Patient reports that she was doing well after her discharge last month but then 4 days ago started having recurrent shortness of breath.  She reports it is exertional.  She reports that last night she started having a chest pain that is very pleuritic in nature.  She reports that it is sharp and aching.  It is in her central chest and radiates to her neck.  She reports this felt similar to the prior pneumothorax.  She denies fevers or chills but does report a chronic productive cough.  She denies any urinary symptoms, patient with diarrhea.  She denies nausea vomiting or diaphoresis.  She has no history of DVT or PE in the past.  She denies any abdominal pain or other complaints on arrival.  She does report some chronic back pain.  On exam, patient has minimal tenderness in her anterior chest.  I cannot reproduce the same pain.  Patient had coarse breath sounds bilaterally but the breath sounds were equal on my exam.  No wheezing was appreciated.  Patient did not have tenderness in her neck or back.  Patient's abdomen was nontender.  Legs showed no edema and no tenderness present.  Patient has symmetric pulses in upper and lower extremities.  Patient was tachycardic in the 130s and had normal oxygen saturation on her 2 L.  Patient was tachypneic.  Next  Based on patient's exam and history I am concerned about occult pneumothorax that was missed on initial screening chest  x-ray.  The x-ray does not show pneumothorax initially.  Also concern for pneumonia given the productive cough.  Initial troponin was negative however will repeat it for delta.  Next  Anticipate reassessment after workup.  Patient was given fluids in case her tachycardia is due to dehydration and will also be given fentanyl for possible musculoskeletal  pain in the setting of previous chest tube.     Heart rate improved with fluids.  Patient reports she is always tachycardic in the 100-1 teens.   Chest x-ray showed no evidence of pneumothorax however there was still left lower lobe nodule.  Given concern for possible occult pneumothorax, pulmonary embolism, or pneumonia; PE study was ordered.  CT scan revealed no evidence of pulmonary embolism or pneumothorax.  Patient had changes in her nodules that suggested being from an infectious or inflammatory source.  Patient had some wheezing on reassessment, suspect COPD exacerbation contributing to symptoms.  Patient reports that he has had some improvement with inhalers at home.  Patient will be given prescription for prednisone for several days and instructed to follow-up with PCP.  Laboratory testing was otherwise reassuring.  Patient requested discharge home.  Patient was ambulated and was hypoxic on room air however, at rest she did well on room air.  Patient agreed with plan for discharge home and outpatient management.  Patient understood return precautions and was discharged in good condition with resolution of presenting chest pain shortness of breath.   Final Clinical Impressions(s) / ED Diagnoses   Final diagnoses:  COPD exacerbation ([place])  Shortness of breath    ED Discharge Orders        Ordered    predniSONE (DELTASONE) 50 MG tablet  Daily     08/16/17 2240      Clinical Impression: 1. COPD exacerbation ([place])   2. Shortness of breath     Disposition: Discharge  Condition: Good  I have discussed the results, Dx and Tx plan  with the pt(& family if present). He/she/they expressed understanding and agree(s) with the plan. Discharge instructions discussed at great length. Strict return precautions discussed and pt &/or family have verbalized understanding of the instructions. No further questions at time of discharge.    Discharge Medication List as of 08/16/2017 10:40 PM    START taking these medications   Details  predniSONE (DELTASONE) 50 MG tablet Take 1 tablet (50 mg total) by mouth daily for 4 days., Starting Tue 08/16/2017, Until Sat 08/20/2017, Print        Follow Up: [name], MD [address] [place] [address] [phone]     [place] [address] [id number] mc [place]       Tegeler, Gwenyth Allegra, MD 08/17/17 [phone]

## 2017-08-17 NOTE — Telephone Encounter (Signed)
Spoke with pt, states that since [facility] cannot offer what she needs she would like to proceed with an order to [facility].  MW please advise if ok to order.  Thanks!

## 2017-08-17 NOTE — Telephone Encounter (Signed)
I've never seen this be that simple but she's certainly free to try - normally you have to qualify for this and prove that the amt of 02 it supplies fit the needs and we do a formal ambulatory 02 titration to see if eligible for POC first - if she can do this on her own with just a signature from me that's fine/ otherwise would need ov to sort out/ titrate etc

## 2017-08-17 NOTE — Telephone Encounter (Signed)
Order placed for [facility] POC.  Pt was just seen on 2/28, if additional testing is needed [facility] will let us know.  Nothing further needed.

## 2017-08-23 NOTE — Telephone Encounter (Signed)
Rec'd completed paperwork from [place]. Fwd to [facility] via interoffice mail -pr

## 2017-09-12 ENCOUNTER — Telehealth: Payer: Self-pay | Admitting: Internal Medicine

## 2017-09-12 MED ORDER — ALBUTEROL SULFATE HFA 108 (90 BASE) MCG/ACT IN AERS: 2.0000 | INHALATION_SPRAY | Freq: Four times a day (QID) | RESPIRATORY_TRACT | 0 refills | Status: DC | PRN

## 2017-09-12 NOTE — Telephone Encounter (Signed)
Called and spoke with patient, she is requesting samples of pro air inhaler. Advised patient that we do not carry samples of that specific inhaler. Patient states that she has misplaced her inhaler and does not remember where she put it. Patient states that she is not due for another inhaler until Apri 15th. Patient states that if we can send in a prescription for one inhaler she would be willing to pay out of pocket in case her insurance does not cover it.    MW please advise, are you ok with sending in a prescription for one inhaler of pro air. Thanks.

## 2017-09-12 NOTE — Telephone Encounter (Signed)
Called and spoke with pt letting her know MW stated it was fine for [place] to send a Rx of 1 inhaler to her pharmacy for her of the proair.  Pt expressed understanding. Stated to pt when she comes to her OV 09/19/17 she needed to bring all meds with her, even otc meds.  Nothing further needed at this time.

## 2017-09-12 NOTE — Telephone Encounter (Signed)
Fine with me but should bring all active meds with her to office

## 2017-09-19 ENCOUNTER — Ambulatory Visit: Payer: BC Managed Care – PPO | Admitting: Internal Medicine

## 2017-09-19 ENCOUNTER — Encounter: Payer: Self-pay | Admitting: Internal Medicine

## 2017-09-19 VITALS — BP 116/62 | HR 122 | Ht 64.0 in | Wt 87.2 lb

## 2017-09-19 DIAGNOSIS — R918 Other nonspecific abnormal finding of lung field: Secondary | ICD-10-CM | POA: Diagnosis not present

## 2017-09-19 DIAGNOSIS — J449 Chronic obstructive pulmonary disease, unspecified: Secondary | ICD-10-CM

## 2017-09-19 MED ORDER — TIOTROPIUM BROMIDE MONOHYDRATE 1.25 MCG/ACT IN AERS: 4.0000 | INHALATION_SPRAY | Freq: Every day | RESPIRATORY_TRACT | 0 refills | Status: DC

## 2017-09-19 NOTE — Patient Instructions (Addendum)
Plan A = Automatic = symbicort 160 Take 2 puffs first thing in am and then another 2 puffs about 12 hours later.  Spiriva is used each am  = one capsule or two puffs off the green or 4 off the blue   Work on inhaler technique:  relax and gently blow all the way out then take a nice smooth deep breath back in, triggering the inhaler at same time you start breathing in.  Hold for up to 5 seconds if you can. Blow out thru nose. Rinse and gargle with water when done     Plan B = Backup Only use your albuterol (proair) as a rescue medication to be used if you can't catch your breath by resting or doing a relaxed purse lip breathing pattern.  - The less you use it, the better it will work when you need it. - Ok to use the inhaler up to 2 puffs  every 4 hours if you must but call for appointment if use goes up over your usual need - Don't leave home without it !!  (think of it like the spare tire for your car)    Please schedule a follow up visit in 3 months but call sooner if needed  - pfts on return

## 2017-09-19 NOTE — Progress Notes (Addendum)
Subjective:    Patient ID: Darlene [name], female    DOB: [birth year]    MRN: [id number]      Brief patient profile:  24  yowf active smoker dx with remote dx copd  By Clance self referred to pulmonary clinic 12/29/2012 with progressive doe x 06/2012 dx with GOLD III COPD 02/02/13      History of Present Illness  12/29/2012 1st pulmonary eval in EPIC era / Darlene [name]  cc indolent onset progressive doe x 6 month getting groceries from car to [employer],  crossing parking lots. In am lots watery nasal congestion and cough > 30 min each am, assoc with chest discomfort midline during cough, prod sev tbsp white mucus,, some better p saba uses 4-5 x day.   rec Continue symbicort 160 Take 2 puffs first thing in am and then another 2 puffs about 12 hours later. Only use your albuterol as a rescue medication  Work on inhaler technique:   02/02/2013 f/u ov/Darlene [name] still smoking  Chief Complaint  Patient presents with  . COPD    Breathing is unchanged. Reports SOB, coughing, chest tightness.   not dosing symbicort q 12 and finding needs proair around 4 pm daily. Cough is min prod white thick mucus daily worse in am rec Continue symbicort 160 Take 2 puffs first thing in am and then another 2 puffs about 12 hours later.  Only use your albuterol as a rescue medication to be used if you can't catch your breath by resting or doing a relaxed purse lip breathing pattern. The less you use it, the better it will work when you need it.      08/04/2016  Re-establish  ov/Darlene [name] re:  Copd GOLD III/ still smoking  Chief Complaint  Patient presents with  . Pulmonary Consult    cough, SOB with physical activity, was recently on prednisone and ABX about 2 weeks ago, still has a lot of congestion   not much better p pred or  abx Still junky cough/ congestion in am > white thick mucus   Using proair 4 x daily  / maybe twice weekly wakes up wheezing but not bad enough usually to  use inhaler noct  Doe x car to back  steps MMRC2 = can't walk a nl pace on a flat grade s sob but does fine slow and flat eg ht ok but can't do wm  rec Plan A = Automatic =  Bevespi Take 2 puffs first thing in am and then another 2 puffs about 12 hours later.  Plan B = Backup Only use your albuterol (proair) as a rescue medication     01/28/2017  f/u ov/Darlene [name] re:  GOLD III/ MPN's  Chief Complaint  Patient presents with  . Follow-up    pt unable to complete PFT.  pt c/o sob with exertion, prod cough with mostly clear mucus.    some better doe since bevespi but still smoking and using saba way too much  Cough worse in am's no better on bevespi ? Better on symbicort  Doe still = MMRC2  rec Continue symbicort 160 Take 2 puffs first thing in am and then another 2 puffs about 12 hours later.  Add spiriva 2 pffs each am  - call if insurance won't cover with list of alternatives  Only use your albuterol as a rescue medication      09/19/2017  f/u ov/Darlene [name] re:  GOLD III copd/ has 02 but not using or monitoring her  sats / resumed smoking  Chief Complaint  Patient presents with  . Follow-up    Breathing has been slightly worse "might be my nerves". She has a prod cough with white sputum.  She has been using her albuterol inhaler 3-4 x per day.    Dyspnea:  Slow walking across parking lots s stopping = MMRC2 = can't walk a nl pace on a flat grade s sob but does fine slow and flat  Cough: white mucus/ mostly in am s Sleep: ok in recliner x 45 degrees SABA use: mostly in day with cleaning/ unloading groceries   Also limited by back from exertion  No obvious day to day or daytime variability or assoc excess/ purulent sputum or mucus plugs or hemoptysis or cp or chest tightness, subjective wheeze or overt sinus or hb symptoms. No unusual exposure hx or h/o childhood pna/ asthma or knowledge of premature birth.  Sleeping ok 45 degree recliner chronically s  nocturnal    exacerbation  of respiratory  c/o's or need for noct saba. Also  denies any obvious fluctuation of symptoms with weather or environmental changes or other aggravating or alleviating factors except as outlined above   Current Allergies, Complete Past Medical History, Past Surgical History, Family History, and Social History were reviewed in [employer] record.  ROS  The following are not active complaints unless bolded Hoarseness, sore throat, dysphagia, dental problems, itching, sneezing,  nasal congestion or discharge of excess mucus or purulent secretions, ear ache,   fever, chills, sweats, unintended wt loss or wt gain, classically pleuritic or exertional cp,  orthopnea pnd or leg swelling, presyncope, palpitations, abdominal pain, anorexia, nausea, vomiting, diarrhea  or change in bowel habits or change in bladder habits, change in stools or change in urine, dysuria, hematuria,  rash, arthralgias, visual complaints, headache, numbness, weakness or ataxia or problems with walking or coordination,  change in mood= more anxious/affect or memory.        Current Meds  Medication Sig  . acetaminophen (TYLENOL) 500 MG tablet Take 500-1,000 mg by mouth every 6 (six) hours as needed (for pain).   Marland Kitchen albuterol (PROAIR HFA) 108 (90 Base) MCG/ACT inhaler Inhale 2 puffs into the lungs every 6 (six) hours as needed for wheezing or shortness of breath.  . ALPRAZolam (XANAX) 0.25 MG tablet Take 0.25 mg by mouth daily.   . budesonide-formoterol (SYMBICORT) 160-4.5 MCG/ACT inhaler Inhale 2 puffs into the lungs 2 (two) times daily.  . Calcium Carb-Cholecalciferol (CALCIUM+D3 PO) Take 1 tablet by mouth 2 (two) times a week.  . DULoxetine (CYMBALTA) 60 MG capsule Take 60 mg by mouth daily.  Marland Kitchen guaiFENesin (MUCINEX) 600 MG 12 hr tablet Take 2 tablets (1,200 mg total) by mouth 2 (two) times daily. (Patient taking differently: Take 600 mg by mouth daily. )  . tiotropium (SPIRIVA) 18 MCG inhalation capsule Place 18 mcg into inhaler and inhale daily.  . traMADol  (ULTRAM) 50 MG tablet Take 50 mg by mouth every 6 (six) hours as needed (for pain).   . Vitamin D, Ergocalciferol, (DRISDOL) 50000 units CAPS capsule Take 50,000 Units by mouth every 14 (fourteen) days.                Objective:   Physical Exam  amb wf nad  Vital signs reviewed - Note on arrival 02 sats  92% on RA  - note pulse 122   09/19/2017         87  01/28/2017  88  08/04/16 93 lb (42.2 kg)  04/22/15 102 lb (46.3 kg)  03/11/15 104 lb (47.2 kg)      02/02/13 91 lb (41.277 kg)  12/29/12 92 lb 9.6 oz (42.003 kg)       HEENT: nl dentition / oropharynx. Nl external ear canals without cough reflex - moderate bilateral non-specific turbinate edema     NECK :  without JVD/Nodes/TM/ nl carotid upstrokes bilaterally   LUNGS: no acc muscle use,  Mod barrel  contour chest wall with bilateral  Distant bs s audible wheeze and  without cough on insp or exp maneuver and mod   Hyperresonant  to  percussion bilaterally     CV:  RRR  no s3 or murmur or increase in P2, and no edema   ABD:  soft and nontender with pos mid insp Hoover's  in the supine position. No bruits or organomegaly appreciated, bowel sounds nl  MS:   Nl gait/  ext warm without deformities, calf tenderness, cyanosis or clubbing No obvious joint restrictions   SKIN: warm and dry without lesions    NEURO:  alert, approp, nl sensorium with  no motor or cerebellar deficits apparent.          I personally reviewed images and agree with radiology impression as follows:   Chest CTa chest 08/16/17 1. No evidence of pulmonary emboli. 2. Decreased size of cavitary right upper lobe nodule and resolution of 1 left lower lobe nodule. A 9 x 5 mm left lower lobe nodule has enlarged, while other bilateral lung nodules are unchanged. Mixed interval changes in a relatively short time period suggest an infectious/inflammatory etiology.         Assessment & Plan:

## 2017-09-19 NOTE — Assessment & Plan Note (Signed)
PER CT CHEST ON 07/10/14... NEW 9 MM SLIGHTLY SPICULATED NODULE IN THE SUPERIOR ASPECT OF THE LEFT LOWER LOBE - PET 08/02/14 > 1. Rounded left lower lobe pulmonary nodule with trace metabolic activity. This combination of findings is not typical for bronchogenic carcinoma or carcinoma in situ and favors benign etiology. As this is a new finding, recommend follow-up CT thorax in 3 months.>  Dr Cyndia Bent rec f/u 3 m but never done  - CT chest 7/209/18 Lung nodules as described. The largest, which is cavitary, lies in the right upper lobe, measuring 20 x 9 mm. There is a new subcentimeter nodule in the inferior right upper lobe. 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

## 2017-09-19 NOTE — Assessment & Plan Note (Addendum)
-   PFT's 02/02/2013   FEV1 1.28 (50%)   p  33% response to saba  with ratio 47 and DLCO 66 corrects to 73  - hfa 75 % 12/29/2012  > 75% 02/02/2013  -   alpha testing 02/02/2013 > MM - 08/04/2016    try bevespi > no decrease saba use - 01/28/2017    rechallenge with symb 160 2bid  - 06/22/2017      50% > try add spiriva respimat> pharmacy substitued the dpi  - Spirometry 06/22/2017  FEV1 0.97 (39%)  Ratio 46 though < 6 sec exp   - 09/19/2017  After extensive coaching inhaler device  effectiveness =    75% (short ti) on smi so resume spiriva smi  She is still way over using the saba and clearly  Group D in terms of symptom/risk and laba/lama/ICS  therefore  Added continue  spiriva as the smi version added on to symbicort and return for full pfts/ work on complete smoking cessation if possible in meantime    I had an extended discussion with the patient reviewing all relevant studies completed to date and  lasting 15 to 20 minutes of a 25 minute visit    Each maintenance medication was reviewed in detail including most importantly the difference between maintenance and prns and under what circumstances the prns are to be triggered using an action plan format that is not reflected in the computer generated alphabetically organized AVS.    Please see AVS for specific instructions unique to this visit that I personally wrote and verbalized to the the pt in detail and then reviewed with pt  by my nurse highlighting any  changes in therapy recommended at today's visit to their plan of care.

## 2017-09-20 ENCOUNTER — Telehealth: Payer: Self-pay | Admitting: *Deleted

## 2017-09-20 DIAGNOSIS — J449 Chronic obstructive pulmonary disease, unspecified: Secondary | ICD-10-CM

## 2017-09-20 NOTE — Telephone Encounter (Signed)
Spoke with the pt and arranged for PFT's on her return visit

## 2017-09-20 NOTE — Telephone Encounter (Signed)
-----   Message from Tanda Rockers, MD sent at 09/19/2017  5:22 PM EDT ----- pfts on return ov

## 2017-10-07 ENCOUNTER — Telehealth: Payer: Self-pay | Admitting: Internal Medicine

## 2017-10-07 MED ORDER — PREDNISONE 10 MG PO TABS: ORAL_TABLET | ORAL | 0 refills | Status: DC

## 2017-10-07 NOTE — Telephone Encounter (Signed)
Spoke with pt, she states the last two days it is very hard to breathe. When she tries to get up to do anything it gets worse. She doesn't even have the energy to take a shower. She has a lot of phlegm in the back of her throat and congestion in her chest. She is on 2L of O2 at this time. She has taken her inhalers and tried to use her ProAir but it doesn't help much. She states her O2 got down in the 80's when she checked it. I asked her to check it while we were on the phone and she read 88% but she has been sitting for a while. Please advise [name].  [facility]    Assessment & Plan Note by Tanda Rockers, MD at 09/19/2017 5:18 PM  Author: Tanda Rockers, MD Author Type: Physician Filed: 09/19/2017 5:24 PM  Note Status: [place] List: Cosign Not Required Encounter Date: 09/19/2017  Problem: COPD GOLD III   Editor: Tanda Rockers, MD (Physician)  Prior Versions: 1. Tanda Rockers, MD (Physician) at 09/19/2017 5:19 PM - Written    - PFT's 02/02/2013   FEV1 1.28 (50%)   p  33% response to saba  with ratio 47 and DLCO 66 corrects to 73  - hfa 75 % 12/29/2012  > 75% 02/02/2013  -   alpha testing 02/02/2013 > MM - 08/04/2016    try bevespi > no decrease saba use - 01/28/2017    rechallenge with symb 160 2bid  - 06/22/2017      50% > try add spiriva respimat> pharmacy substitued the dpi  - Spirometry 06/22/2017  FEV1 0.97 (39%)  Ratio 46 though < 6 sec exp   - 09/19/2017  After extensive coaching inhaler device  effectiveness =    75% (short ti) on smi so resume spiriva smi  She is still way over using the saba and clearly  Group D in terms of symptom/risk and laba/lama/ICS  therefore  Added continue  spiriva as the smi version added on to symbicort and return for full pfts/ work on complete smoking cessation if possible in meantime    I had an extended discussion with the patient reviewing all relevant studies completed to date and  lasting 15 to 20 minutes of a 25 minute visit    Each maintenance  medication was reviewed in detail including most importantly the difference between maintenance and prns and under what circumstances the prns are to be triggered using an action plan format that is not reflected in the computer generated alphabetically organized AVS.    Please see AVS for specific instructions unique to this visit that I personally wrote and verbalized to the the pt in detail and then reviewed with pt  by my nurse highlighting any  changes in therapy recommended at today's visit to their plan of care.

## 2017-10-07 NOTE — Telephone Encounter (Signed)
Called pt and advised message from the provider. Pt understood and verbalized understanding. Nothing further is needed.    

## 2017-10-07 NOTE — Telephone Encounter (Signed)
Prednisone 10 mg take  4 each am x 2 days,   2 each am x 2 days,  1 each am x 2 days and stop  

## 2017-10-07 NOTE — Telephone Encounter (Signed)
ATC pt, no answer. Left message for pt to call back.  Prednisone was sent to her pharmacy.

## 2017-10-10 ENCOUNTER — Other Ambulatory Visit: Payer: Self-pay | Admitting: Internal Medicine

## 2017-10-12 ENCOUNTER — Ambulatory Visit (INDEPENDENT_AMBULATORY_CARE_PROVIDER_SITE_OTHER): Payer: BC Managed Care – PPO | Admitting: Orthopedic Surgery

## 2017-10-12 ENCOUNTER — Encounter (INDEPENDENT_AMBULATORY_CARE_PROVIDER_SITE_OTHER): Payer: Self-pay | Admitting: Orthopedic Surgery

## 2017-10-12 ENCOUNTER — Ambulatory Visit (INDEPENDENT_AMBULATORY_CARE_PROVIDER_SITE_OTHER): Payer: Self-pay

## 2017-10-12 ENCOUNTER — Telehealth: Payer: Self-pay | Admitting: Internal Medicine

## 2017-10-12 DIAGNOSIS — M546 Pain in thoracic spine: Secondary | ICD-10-CM | POA: Diagnosis not present

## 2017-10-12 MED ORDER — CALCITONIN (SALMON) 200 UNIT/ACT NA SOLN: 1.0000 | Freq: Every day | NASAL | 1 refills | Status: DC

## 2017-10-12 NOTE — Telephone Encounter (Signed)
Yes - they don't pay me anything to do this but I understand there is an administrative charge I don't control.

## 2017-10-12 NOTE — Progress Notes (Signed)
Office Visit Note   Patient: Darlene [name]           Date of Birth: Jun 19, 1955           MRN: [id number] Visit Date: 10/12/2017 Requested by: Darlene Austin, MD [address], [place] [id number] PCP: Darlene Austin, MD  Subjective: Chief Complaint  Patient presents with  . Middle Back - Pain    HPI: Darlene [name] is a 62 year old patient with back pain.  She states that "my upper back hurts".  Been ongoing for months.  Has not had an injury.  She has been a little bit depressed and has been losing weight.  She has no real desire to eat.  Since I have last seen her she has been hospitalized twice with collapsed lung which is required chest tube placement.  She is on vitamin D.  She also takes Tylenol and tramadol for her symptoms.  She does not want any narcotics.              ROS: All systems reviewed are negative as they relate to the chief complaint within the history of present illness.  Patient denies  fevers or chills. Impression is new  Assessment & Plan: Visit Diagnoses:  1. Pain in thoracic spine     Plan: Compression fracture compared to radiographs from May 2018.  She does have a new small compression fracture at T8.  She is been seen before by neurosurgeons who thought that kyphoplasty or vertebroplasty was not indicated.  She is on vitamin D.  I think calcitonin nasally may help with some of these acute pain symptoms.  Short of that is not really whole lot to do.  This fracture is to hide a brace.  I will see her back as needed  Follow-Up Instructions: Return if symptoms worsen or fail to improve.   Orders:  Orders Placed This Encounter  Procedures  . XR Thoracic Spine 2 View   Meds ordered this encounter  Medications  . calcitonin, salmon, (MIACALCIN) 200 UNIT/ACT nasal spray    Sig: Place 1 spray into alternate nostrils daily. 1 spray into alternate nostrils daily x 2 weeks    Dispense:  3.7 mL    Refill:  1      Procedures: No procedures  performed   Clinical Data: No additional findings.  Objective: Vital Signs: There were no vitals taken for this visit.  Physical Exam:   Constitutional: Patient appears well-developed but thin HEENT:  Head: Normocephalic Eyes:EOM are normal Neck: Normal range of motion Cardiovascular: Normal rate Pulmonary/chest: Effort normal Neurologic: Patient is alert Skin: Skin is warm Psychiatric: Patient has normal mood and affect    Ortho Exam: Orthopedic exam demonstrates normal gait and alignment.  Patient has palpable pedal pulses and no nerve root tension signs.  She has no groin pain with internal/external rotation of the leg.  No other masses lymph adenopathy or skin changes noted in that back region.  She has negative Babinski negative clonus and symmetric reflexes.  Some pain with forward and lateral bending. Specialty Comments:  No specialty comments available.  Imaging: Xr Thoracic Spine 2 View  Result Date: 10/12/2017 AP lateral thoracic spine reviewed.  Fracture present at T7 which is chronic.  New fracture at T8 involving 1% compression of the superior endplate is observed.    PMFS History: Patient Active Problem List   Diagnosis Date Noted  . Recurrent spontaneous pneumothorax 07/18/2017  . Nodule of apex of right  lung 06/18/2017  . Spontaneous pneumothorax on L 06/17/17 06/17/2017  . Acute respiratory failure with hypoxia ([place]) 06/17/2017  . Anxiety disorder 06/17/2017  . Tobacco abuse 06/17/2017  . Severe protein-calorie malnutrition ([place]) 06/17/2017  . 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 Osteoporosis 06/17/2017  . 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 Anxiety   . COPD (chronic obstructive pulmonary disease) ([place])   . Substance abuse ([place])   . Pulmonary nodules 07/10/2014  . Cough 03/17/2013  . Cigarette smoker 12/30/2012  . COPD GOLD III  12/29/2012   Past Medical History:  Diagnosis Date  . Anxiety   . COPD (chronic obstructive  pulmonary disease) ([place])   . Emphysema   . Substance abuse ([place])    tobacco dependance    Family History  Problem Relation Age of Onset  . Alzheimer's disease Mother   . Cancer Paternal Aunt        colon    Past Surgical History:  Procedure Laterality Date  . BACK SURGERY    . KNEE SURGERY     Social History   Occupational History  . Occupation: Audiological scientist: [employer]  Tobacco Use  . Smoking status: Current Every Day Smoker    Packs/day: 1.00    Years: 42.00    Pack years: 42.00    Types: Cigarettes  . Smokeless tobacco: Never Used  . Tobacco comment: quit Jan 2019 for 15 days and has started back up  Substance and Sexual Activity  . Alcohol use: Yes    Alcohol/week: 3.0 oz    Types: 5 Cans of beer per week    Comment: 3-5 cans three to four timesa week  . Drug use: No  . Sexual activity: Not on file

## 2017-10-12 NOTE — Telephone Encounter (Signed)
Called pt and advised message from the provider. Pt understood and verbalized understanding. Nothing further is needed.    

## 2017-10-12 NOTE — Telephone Encounter (Signed)
Spoke with patient. She is aware of the FMLA process. She wants to know if MW would be willing to sign FMLA paperwork for her in regards to her COPD. She wants to know before having to pay to get the forms filled out and wasting $25.   MW, please advise if you would be ok with filling out FMLA paperwork for her. Thanks!

## 2017-10-13 ENCOUNTER — Telehealth: Payer: Self-pay | Admitting: Internal Medicine

## 2017-10-13 NOTE — Telephone Encounter (Signed)
Spoke with Darlene [name]. [employer] will need to contact the DME for the CPT code.   ATC [employer], call was disconnected after a 2 min wait. Will attempt to call back later.

## 2017-10-14 NOTE — Telephone Encounter (Signed)
Spoke with a rep from [facility] and she stated there is nothing needed for this pt as far as she can see in the computer. I did not have a reference number to f=give and will close encounter.

## 2017-10-15 ENCOUNTER — Other Ambulatory Visit: Payer: Self-pay | Admitting: Internal Medicine

## 2017-10-25 ENCOUNTER — Other Ambulatory Visit: Payer: Self-pay | Admitting: Internal Medicine

## 2017-10-25 ENCOUNTER — Telehealth: Payer: Self-pay | Admitting: Internal Medicine

## 2017-10-25 NOTE — Telephone Encounter (Signed)
ATC pt, no answer. Left message for pt to call back.  Awaiting MW to fill out papers.

## 2017-10-25 NOTE — Telephone Encounter (Signed)
Forms placed in Dr Gustavus Bryant lookat

## 2017-10-25 NOTE — Telephone Encounter (Signed)
Pt is calling about [place] papers [phone]

## 2017-10-26 NOTE — Telephone Encounter (Signed)
Spoke with pt. She is aware that we are waiting for [name] to fill these forms out.

## 2017-10-27 ENCOUNTER — Ambulatory Visit (INDEPENDENT_AMBULATORY_CARE_PROVIDER_SITE_OTHER): Payer: BC Managed Care – PPO | Admitting: Internal Medicine

## 2017-10-27 ENCOUNTER — Encounter: Payer: Self-pay | Admitting: Internal Medicine

## 2017-10-27 VITALS — BP 130/88 | HR 130 | Ht 64.0 in | Wt 83.8 lb

## 2017-10-27 DIAGNOSIS — F17210 Nicotine dependence, cigarettes, uncomplicated: Secondary | ICD-10-CM

## 2017-10-27 DIAGNOSIS — J449 Chronic obstructive pulmonary disease, unspecified: Secondary | ICD-10-CM

## 2017-10-27 DIAGNOSIS — J9611 Chronic respiratory failure with hypoxia: Secondary | ICD-10-CM

## 2017-10-27 NOTE — Patient Instructions (Addendum)
I am going to defer bone density to you, Dr Inda Merlin and Dr Marlou Sa but it would appear you a medication to build your bones and prolia would probably be the best choice as it has not adverse effect on your swallowing (IV reclast similar)   Because of your copd and spinal fractures I don't rec you resume work and doubt you will be able to return to work effecitve Oct 24 2017    Work on inhaler technique:  relax and gently blow all the way out then take a nice smooth deep breath back in, triggering the inhaler at same time you start breathing in.  Hold for up to 5 seconds if you can. Blow out thru nose. Rinse and gargle with water when done      Keep your follow up appt as scheduled

## 2017-10-27 NOTE — Telephone Encounter (Signed)
Done at [facility] 10/27/2017

## 2017-10-27 NOTE — Progress Notes (Signed)
Subjective:    Patient ID: Darlene [name], female    DOB: [birth year]    MRN: [id number]      Brief patient profile:  22  yowf active smoker dx with remote dx copd  By Cl[facility] self referred to pulmonary clinic 12/29/2012 with progressive doe x 06/2012 dx with GOLD III COPD 02/02/13      History of Present Illness  12/29/2012 1st pulmonary eval in EPIC era / [facility]  cc indolent onset progressive doe x 6 month getting groceries from car to [employer],  crossing parking lots. In am lots watery nasal congestion and cough > 30 min each am, assoc with chest discomfort midline during cough, prod sev tbsp white mucus,, some better p saba uses 4-5 x day.   rec Continue symbicort 160 Take 2 puffs first thing in am and then another 2 puffs about 12 hours later. Only use your albuterol as a rescue medication  Work on inhaler technique:      09/19/2017  f/u ov/[facility] re:  GOLD III copd/ has 02 but not using or monitoring her sats / resumed smoking / Chief Complaint  Patient presents with  . Follow-up    Breathing has been slightly worse "might be my nerves". She has a prod cough with white sputum.  She has been using her albuterol inhaler 3-4 x per day.   Dyspnea:  Slow walking across parking lot  s stopping = MMRC2 = can't walk a nl pace on a flat grade s sob but does fine slow and flat s 02  Cough: white mucus/ mostly in am   Sleep: ok in recliner x 45 degrees SABA use: mostly in day with cleaning/ unloading groceries   Also limited by back from exertion rec  Plan A = Automatic = symbicort 160 Take 2 puffs first thing in am and then another 2 puffs about 12 hours later.  Spiriva is used each am  = one capsule or two puffs off the green or 4 off the blue Work on inhaler technique:     Plan B = Backup Only use your albuterol (proair) as a rescue medication    10/27/2017  f/u ov/[facility] re: copd gold III  Using 3lpm hs / maint symb/spiriva Chief Complaint  Patient presents with  . Follow-up    SOB,  productive cough (white sputum), wheezing, O2 at bedtime and a couple times a day, using inhalers and rescue inhaler more than 4 times a day  Dyspnea: worse= MMRC3 = can't walk 100 yards even at a slow pace at a flat grade s stopping due to sob  And not using portable 02/ using 3lpm hs only  Cough: some noct cough/ mucoid  Sleep: at 45 degrees SABA use:  4 x daily as noted   Work requirement  Does not have hc sticker, sits at desk but back pain is the limiting issue with deskwork    No obvious day to day or daytime variability or assoc   purulent sputum or mucus plugs or hemoptysis or cp or chest tightness, subjective wheeze or overt sinus or hb symptoms. No unusual exposure hx or h/o childhood pna/ asthma or knowledge of premature birth.   Also denies any obvious fluctuation of symptoms with weather or environmental changes or other aggravating or alleviating factors except as outlined above   Current Allergies, Complete Past Medical History, Past Surgical History, Family History, and Social History were reviewed in [employer] record.  ROS  The  following are not active complaints unless bolded Hoarseness, sore throat, dysphagia, dental problems, itching, sneezing,  nasal congestion or discharge of excess mucus or purulent secretions, ear ache,   fever, chills, sweats, unintended wt loss or wt gain, classically pleuritic or exertional cp,  orthopnea pnd or arm/hand swelling  or leg swelling, presyncope, palpitations, abdominal pain, anorexia, nausea, vomiting, diarrhea  or change in bowel habits or change in bladder habits, change in stools or change in urine, dysuria, hematuria,  rash, arthralgias, visual complaints, headache, numbness, weakness or ataxia or problems with walking or coordination,  change in mood or  memory.        Current Meds  Medication Sig  . acetaminophen (TYLENOL) 500 MG tablet Take 500-1,000 mg by mouth every 6 (six) hours as needed (for pain).   Marland Kitchen  albuterol (PROVENTIL HFA;VENTOLIN HFA) 108 (90 Base) MCG/ACT inhaler TAKE 2 PUFFS BY MOUTH EVERY 6 HOURS AS NEEDED FOR WHEEZE OR SHORTNESS OF BREATH  . ALPRAZolam (XANAX) 0.25 MG tablet Take 0.25 mg by mouth daily.   . calcitonin, salmon, (MIACALCIN) 200 UNIT/ACT nasal spray Place 1 spray into alternate nostrils daily. 1 spray into alternate nostrils daily x 2 weeks  . Calcium Carb-Cholecalciferol (CALCIUM+D3 PO) Take 1 tablet by mouth 2 (two) times a week.  . DULoxetine (CYMBALTA) 60 MG capsule Take 60 mg by mouth daily.  Marland Kitchen guaiFENesin (MUCINEX) 600 MG 12 hr tablet Take 2 tablets (1,200 mg total) by mouth 2 (two) times daily. (Patient taking differently: Take 600 mg by mouth daily. )  . predniSONE (DELTASONE) 10 MG tablet Take 4 tabs for 2 days, then 2 tabs for 2 days, 1 tab for 2 days then stop.  . SPIRIVA RESPIMAT 2.5 MCG/ACT AERS INHALE 2 PUFFS INTO THE LUNGS ONCE DAILY  . SYMBICORT 160-4.5 MCG/ACT inhaler INHALE 2 PUFFS BY MOUTH TWICE A DAY  . traMADol (ULTRAM) 50 MG tablet Take 50 mg by mouth every 6 (six) hours as needed (for pain).   . Vitamin D, Ergocalciferol, (DRISDOL) 50000 units CAPS capsule Take 50,000 Units by mouth every 14 (fourteen) days.   . [DISCONTINUED] tiotropium (SPIRIVA) 18 MCG inhalation capsule Place 18 mcg into inhaler and inhale daily.  . [DISCONTINUED] Tiotropium Bromide Monohydrate (SPIRIVA RESPIMAT) 1.25 MCG/ACT AERS Inhale 4 puffs into the lungs daily.                     Objective:   Physical Exam  amb wf nad   Vital signs reviewed - Note on arrival 02 sats  94% on RA    10/27/2017       83 09/19/2017         87  01/28/2017       88  08/04/16 93 lb (42.2 kg)  04/22/15 102 lb (46.3 kg)  03/11/15 104 lb (47.2 kg)      02/02/13 91 lb (41.277 kg)  12/29/12 92 lb 9.6 oz (42.003 kg)         HEENT: nl dentition / oropharynx. Nl external ear canals without cough reflex - moderate bilateral non-specific turbinate edema     NECK :  without  JVD/Nodes/TM/ nl carotid upstrokes bilaterally   LUNGS: no acc muscle use,  Mod barrel  contour chest wall with bilateral  Distant bs s audible wheeze and  without cough on insp or exp maneuver and mod   Hyperresonant  to  percussion bilaterally     CV:  RRR  no s3 or murmur or  increase in P2, and no edema   ABD:  soft and nontender with pos mid insp Hoover's  in the supine position. No bruits or organomegaly appreciated, bowel sounds nl  MS:   Nl gait/  ext warm without deformities, calf tenderness, cyanosis or clubbing No obvious joint restrictions   SKIN: warm and dry without lesions    NEURO:  alert, approp, nl sensorium with  no motor or cerebellar deficits apparent.               I personally reviewed images and agree with radiology impression as follows:   Chest CTa chest 08/16/17 1. No evidence of pulmonary emboli. 2. Decreased size of cavitary right upper lobe nodule and resolution of 1 left lower lobe nodule. A 9 x 5 mm left lower lobe nodule has enlarged, while other bilateral lung nodules are unchanged. Mixed interval changes in a relatively short time period suggest an infectious/inflammatory etiology.         Assessment & Plan:

## 2017-10-30 ENCOUNTER — Encounter: Payer: Self-pay | Admitting: Internal Medicine

## 2017-10-30 DIAGNOSIS — J9611 Chronic respiratory failure with hypoxia: Secondary | ICD-10-CM | POA: Insufficient documentation

## 2017-10-30 NOTE — Assessment & Plan Note (Signed)
On 3lpm hs chronically 10/27/2017   Walked RA  2 laps @ 185 ft each stopped due to  Sob/ no desats at moderate pace

## 2017-10-30 NOTE — Assessment & Plan Note (Signed)
4-5 min discussion re active cigarette smoking in addition to office E&M  Ask about tobacco use:  active Advise quitting      I emphasized that although we never turn away smokers from the pulmonary clinic, we do ask that they understand that the recommendations that we make  won't work nearly as well in the presence of continued cigarette exposure.  In fact, we may very well  reach a point where we can't promise to help the patient if he/she can't quit smoking. (We can and will promise to try to help, we just can't promise what we recommend will really work)  Assess willingness not yet Assist in quit attempt when ready per pcp  Arrange follow up. Follow up per Primary Care planned

## 2017-10-30 NOTE — Assessment & Plan Note (Signed)
-   PFT's 02/02/2013   FEV1 1.28 (50%)   p  33% response to saba  with ratio 47 and DLCO 66 corrects to 73  - hfa 75 % 12/29/2012  > 75% 02/02/2013  -   alpha testing 02/02/2013 > MM - 08/04/2016    try bevespi > no decrease saba use - 01/28/2017    rechallenge with symb 160 2bid  - 06/22/2017      50% > try add spiriva respimat> pharmacy substitued the dpi  - Spirometry 06/22/2017  FEV1 0.97 (39%)  Ratio 46 though < 6 sec exp   - 09/19/2017  After extensive coaching inhaler device  effectiveness =    75% (short ti) on smi so resume spiriva smi  - The proper method of use, as well as anticipated side effects, of a metered-dose inhaler are discussed and demonstrated to the patient    Group D in terms of symptom/risk  Pt is severe copd and laba/lama/ICS  therefore appropriate rx at this point and seeking full disability on basis of copd/limited from doing desk work by chronic back pain   No change in pulmonary rx  I had an extended discussion with the patient reviewing all relevant studies completed to date and  lasting 15 to 20 minutes of a 25 minute visit    See device teaching which extended face to face time for this visit   Also completed her forms with her going over all present symptoms / limitations/ job requirements  Each maintenance medication was reviewed in detail including most importantly the difference between maintenance and prns and under what circumstances the prns are to be triggered using an action plan format that is not reflected in the computer generated alphabetically organized AVS.    Please see AVS for specific instructions unique to this visit that I personally wrote and verbalized to the the pt in detail and then reviewed with pt  by my nurse highlighting any  changes in therapy recommended at today's visit to their plan of care.

## 2017-11-03 ENCOUNTER — Telehealth (INDEPENDENT_AMBULATORY_CARE_PROVIDER_SITE_OTHER): Payer: Self-pay | Admitting: Orthopedic Surgery

## 2017-11-03 NOTE — Telephone Encounter (Signed)
Patient is requesting something stronger than tramadol, she said the pain is so bad she can hardly do anything. She doesn't want anything addictive, please advise  # [phone]

## 2017-11-03 NOTE — Telephone Encounter (Signed)
Please advise 

## 2017-11-04 NOTE — Telephone Encounter (Signed)
There is nothing stronger for pain than tramadol that is not addictive pls claal thx

## 2017-11-09 NOTE — Telephone Encounter (Signed)
I called patient and advised/discussed.

## 2017-11-17 ENCOUNTER — Telehealth: Payer: Self-pay | Admitting: Internal Medicine

## 2017-11-17 NOTE — Telephone Encounter (Signed)
Rec'd disability forms via fax from [employer]. Fwd to [employer] via interoffice mail-pr

## 2017-11-23 ENCOUNTER — Telehealth: Payer: Self-pay | Admitting: Internal Medicine

## 2017-11-23 NOTE — Telephone Encounter (Signed)
Called and spoke with patient, patient states that she has new forms to be filled out for [facility]. Advised patient that once she brings the forms we can go further with the process of getting them filled out. Nothing further needed.

## 2017-12-19 ENCOUNTER — Ambulatory Visit: Payer: BC Managed Care – PPO | Admitting: Internal Medicine

## 2017-12-19 ENCOUNTER — Ambulatory Visit (INDEPENDENT_AMBULATORY_CARE_PROVIDER_SITE_OTHER)
Admission: RE | Admit: 2017-12-19 | Discharge: 2017-12-19 | Disposition: A | Discharge status: Home / Self Care | Payer: BC Managed Care – PPO | Source: Ambulatory Visit | Attending: Internal Medicine | Admitting: Internal Medicine

## 2017-12-19 ENCOUNTER — Encounter: Payer: Self-pay | Admitting: Internal Medicine

## 2017-12-19 VITALS — BP 130/70 | HR 73 | Ht 64.0 in | Wt 82.0 lb

## 2017-12-19 DIAGNOSIS — R0789 Other chest pain: Secondary | ICD-10-CM

## 2017-12-19 DIAGNOSIS — J9611 Chronic respiratory failure with hypoxia: Secondary | ICD-10-CM

## 2017-12-19 DIAGNOSIS — F17210 Nicotine dependence, cigarettes, uncomplicated: Secondary | ICD-10-CM

## 2017-12-19 DIAGNOSIS — J449 Chronic obstructive pulmonary disease, unspecified: Secondary | ICD-10-CM | POA: Diagnosis not present

## 2017-12-19 DIAGNOSIS — R918 Other nonspecific abnormal finding of lung field: Secondary | ICD-10-CM | POA: Diagnosis not present

## 2017-12-19 NOTE — Progress Notes (Signed)
Subjective:    Patient ID: Darlene [name], female    DOB: [birth year]    MRN: [id number]      Brief patient profile:  29  yowf active smoker dx with remote dx copd  By Clance self referred to pulmonary clinic 12/29/2012 with progressive doe x 06/2012 dx with GOLD III COPD 02/02/13      History of Present Illness  12/29/2012 1st pulmonary eval in EPIC era / Darlene [name]  cc indolent onset progressive doe x 6 month getting groceries from car to [employer],  crossing parking lots. In am lots watery nasal congestion and cough > 30 min each am, assoc with chest discomfort midline during cough, prod sev tbsp white mucus,, some better p saba uses 4-5 x day.   rec Continue symbicort 160 Take 2 puffs first thing in am and then another 2 puffs about 12 hours later. Only use your albuterol as a rescue medication  Work on inhaler technique:      09/19/2017  f/u ov/Darlene [name] re:  GOLD III copd/ has 02 but not using or monitoring her sats / resumed smoking / Chief Complaint  Patient presents with  . Follow-up    Breathing has been slightly worse "might be my nerves". She has a prod cough with white sputum.  She has been using her albuterol inhaler 3-4 x per day.   Dyspnea:  Slow walking across parking lot  s stopping = MMRC2 = can't walk a nl pace on a flat grade s sob but does fine slow and flat s 02  Cough: white mucus/ mostly in am   Sleep: ok in recliner x 45 degrees SABA use: mostly in day with cleaning/ unloading groceries   Also limited by back from exertion rec  Plan A = Automatic = symbicort 160 Take 2 puffs first thing in am and then another 2 puffs about 12 hours later.  Spiriva is used each am  = one capsule or two puffs off the green or 4 off the blue Work on inhaler technique:     Plan B = Backup Only use your albuterol (proair) as a rescue medication    10/27/2017  f/u ov/Darlene [name] re: copd gold III  Using 3lpm hs / maint symb/spiriva Chief Complaint  Patient presents with  . Follow-up    SOB,  productive cough (white sputum), wheezing, O2 at bedtime and a couple times a day, using inhalers and rescue inhaler more than 4 times a day  Dyspnea: worse= MMRC3 = can't walk 100 yards even at a slow pace at a flat grade s stopping due to sob  And not using portable 02/ using 3lpm hs only  Cough: some noct cough/ mucoid  Sleep: at 45 degrees SABA use:  4 x daily as noted  Work requirement  Does not have hc sticker, sits at desk but back pain is the limiting issue with deskwork   rec I am going to defer bone density to you, Dr Inda Merlin and Dr Marlou Sa but it would appear you need a medication to build your bones and prolia would probably be the best choice as it has not adverse effect on your swallowing (IV reclast similar) Because of your copd and spinal fractures I don't rec you resume work and doubt you will be able to return to work effecitve Oct 24 2017  Work on inhaler technique    12/19/2017  f/u ov/Darlene [name] re: copd GOLD III / still smoking  Chief Complaint  Patient presents with  .  Follow-up    Breathing is doing okay. She states she is using her albuterol inhaler 3-4 x per day on average.    Dyspnea:  Can do [employer] on 02  2.5 lpm slow pace but nothing bigger = MMRC3  Cough: some in am's > white   SABA use: as above -uses when does much over baseline activity, never rechallenges or uses prior to see if helps breathing  02: 2.5 sleeping/ none at rest/ with activity  Uses prn  Continues with back pain sometimes radiating around to R flank s assoc pleuritic features N or V or increase need for saba Better p belching, no greasy food intol   No obvious day to day or daytime variability or assoc excess/ purulent sputum or mucus plugs or hemoptysis or   chest tightness, subjective wheeze or overt sinus or hb symptoms.   Sleeping: at 71  degreees / recliner  without nocturnal  or early am exacerbation  of respiratory  c/o's or need for noct saba. Also denies any obvious fluctuation of symptoms  with weather or environmental changes or other aggravating or alleviating factors except as outlined above   No unusual exposure hx or h/o childhood pna/ asthma or knowledge of premature birth.  Current Allergies, Complete Past Medical History, Past Surgical History, Family History, and Social History were reviewed in [employer] record.  ROS  The following are not active complaints unless bolded Hoarseness, sore throat, dysphagia, dental problems, itching, sneezing,  nasal congestion or discharge of excess mucus or purulent secretions, ear ache,   fever, chills, sweats, unintended wt loss or wt gain, classically pleuritic or exertional cp,  orthopnea pnd or arm/hand swelling  or leg swelling, presyncope, palpitations, abdominal pain, anorexia, nausea, vomiting, diarrhea  or change in bowel habits or change in bladder habits, change in stools or change in urine, dysuria, hematuria,  rash, arthralgias, visual complaints, headache, numbness, weakness or ataxia or problems with walking or coordination,  change in mood = anxious or  memory.        Current Meds  Medication Sig  . acetaminophen (TYLENOL) 500 MG tablet Take 500-1,000 mg by mouth every 6 (six) hours as needed (for pain).   Marland Kitchen albuterol (PROVENTIL HFA;VENTOLIN HFA) 108 (90 Base) MCG/ACT inhaler TAKE 2 PUFFS BY MOUTH EVERY 6 HOURS AS NEEDED FOR WHEEZE OR SHORTNESS OF BREATH  . alendronate (FOSAMAX) 70 MG tablet Take 70 mg by mouth once a week. Take with a full glass of water on an empty stomach.  . ALPRAZolam (XANAX) 0.25 MG tablet Take 0.25 mg by mouth daily.   . Calcium Carb-Cholecalciferol (CALCIUM+D3 PO) Take 1 tablet by mouth 2 (two) times a week.  . DULoxetine (CYMBALTA) 60 MG capsule Take 60 mg by mouth daily.  Marland Kitchen SPIRIVA RESPIMAT 2.5 MCG/ACT AERS INHALE 2 PUFFS INTO THE LUNGS ONCE DAILY  . SYMBICORT 160-4.5 MCG/ACT inhaler INHALE 2 PUFFS BY MOUTH TWICE A DAY  . traMADol (ULTRAM) 50 MG tablet Take 50 mg by mouth  every 6 (six) hours as needed (for pain).   . Vitamin D, Ergocalciferol, (DRISDOL) 50000 units CAPS capsule Take 50,000 Units by mouth every 14 (fourteen) days.                         Objective:   Physical Exam  Thin chronically ill appearing amb wf nad    Vital signs reviewed - Note on arrival 02 sats  90% on  RA and pulse 128 sinus tach on ekg   12/19/2017         82 10/27/2017       83 09/19/2017         87  01/28/2017       88  08/04/16 93 lb (42.2 kg)  04/22/15 102 lb (46.3 kg)  03/11/15 104 lb (47.2 kg)      02/02/13 91 lb (41.277 kg)  12/29/12 92 lb 9.6 oz (42.003 kg)          HEENT: nl dentition / oropharynx. Nl external ear canals without cough reflex - moderate bilateral non-specific turbinate edema     NECK :  without JVD/Nodes/TM/ nl carotid upstrokes bilaterally   LUNGS: no acc muscle use,  Mod barrel  contour chest wall with bilateral  Distant bs s audible wheeze and  without cough on insp or exp maneuver and mod   Hyperresonant  to  percussion bilaterally     CV:  RRR  no s3 or murmur or increase in P2, and no edema   ABD:  soft and nontender with pos mid insp Hoover's  in the supine position. No bruits or organomegaly appreciated, bowel sounds nl  MS:   Nl gait/  ext warm without deformities, calf tenderness, cyanosis or clubbing No obvious joint restrictions   SKIN: warm and dry without lesions    NEURO:  alert, approp, nl sensorium with  no motor or cerebellar deficits apparent.          ekg 12/19/2017 wandering baseline, ST withouth ischemic change   CXR PA and Lateral:   12/19/2017 :    I personally reviewed images and   impression as follows:    severe copd/ coarse marking / no changes nodules        Assessment & Plan:

## 2017-12-19 NOTE — Patient Instructions (Addendum)
Please remember to go to the  x-ray department downstairs in the basement  for your tests - we will call you with the results when they are available.  Only use your albuterol as a rescue medication to be used if you can't catch your breath by resting or doing a relaxed purse lip breathing pattern.  - The less you use it, the better it will work when you need it. - Ok to use up to 2 puffs  every 4 hours if you must but call for immediate appointment if use goes up over your usual need - Don't leave home without it !!  (think of it like the spare tire for your car)   The key is to stop smoking completely before smoking completely stops you!   Please schedule a follow up visit in 3 months but call sooner if needed

## 2017-12-20 ENCOUNTER — Encounter: Payer: Self-pay | Admitting: Internal Medicine

## 2017-12-20 ENCOUNTER — Telehealth: Payer: Self-pay | Admitting: Internal Medicine

## 2017-12-20 NOTE — Progress Notes (Signed)
LMOM ok per [facility]

## 2017-12-20 NOTE — Assessment & Plan Note (Signed)
Chronic/ rx with ultram and fosfamax added, no evidence of a cardiopulmonary source so likely all mscp as suspected by PCP > agree with rx

## 2017-12-20 NOTE — Telephone Encounter (Signed)
Forms placed in Dr Gustavus Bryant lookat

## 2017-12-20 NOTE — Assessment & Plan Note (Signed)
-   PFT's 02/02/2013   FEV1 1.28 (50%)   p  33% response to saba  with ratio 47 and DLCO 66 corrects to 73  - hfa 75 % 12/29/2012  > 75% 02/02/2013  -   alpha testing 02/02/2013 > MM - 08/04/2016    try bevespi > no decrease saba use - 01/28/2017    rechallenge with symb 160 2bid  - 06/22/2017      50% > try add spiriva respimat> pharmacy substitued the dpi  - Spirometry 06/22/2017  FEV1 0.97 (39%)  Ratio 46 though < 6 sec exp   - 09/19/2017  After extensive coaching inhaler device  effectiveness =    75% (short ti) on smi so resume spiriva smi  - The proper method of use, as well as anticipated side effects, of a metered-dose inhaler are discussed and demonstrated to the patient   Over use of saba when would do better just pacing/ resting/ purse lip when sob as little evidence of reversibility and sinus tach likely from saba effects   Group D in terms of symptom/risk and laba/lama/ICS  therefore appropriate rx at this point so continue symb/spiriva    I had an extended discussion with the patient reviewing all relevant studies completed to date and  lasting 15 to 20 minutes of a 25 minute visit    See device teaching which extended face to face time for this visit.  Each maintenance medication was reviewed in detail including emphasizing most importantly the difference between maintenance and prns and under what circumstances the prns are to be triggered using an action plan format that is not reflected in the computer generated alphabetically organized AVS which I have not found useful in most complex patients, especially with respiratory illnesses  Please see AVS for specific instructions unique to this visit that I personally wrote and verbalized to the the pt in detail and then reviewed with pt  by my nurse highlighting any  changes in therapy recommended at today's visit to their plan of care.

## 2017-12-20 NOTE — Assessment & Plan Note (Signed)
On 3lpm hs chronically 10/27/2017   Walked RA  2 laps @ 185 ft each stopped due to  Sob/ no desats at moderate pace   As of 12/19/2017 using 2.5 lpm hs/ prn daytime

## 2017-12-20 NOTE — Assessment & Plan Note (Signed)
PER CT CHEST ON 07/10/14... NEW 9 MM SLIGHTLY SPICULATED NODULE IN THE SUPERIOR ASPECT OF THE LEFT LOWER LOBE - PET 08/02/14 > 1. Rounded left lower lobe pulmonary nodule with trace metabolic activity. This combination of findings is not typical for bronchogenic carcinoma or carcinoma in situ and favors benign etiology. As this is a new finding, recommend follow-up CT thorax in 3 months.>  Dr Cyndia Bent rec f/u 3 m but never done  - CT chest 7/209/18 Lung nodules as described. The largest, which is cavitary, lies in the right upper lobe, measuring 20 x 9 mm. There is a new subcentimeter nodule in the inferior right upper lobe. 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

## 2017-12-20 NOTE — Assessment & Plan Note (Signed)
4-5 min discussion re active cigarette smoking in addition to office E&M  Ask about tobacco use:   ongoing Advise quitting   Matter of life or breath discussion Assess willingness:  Not committed at this point Assist in quit attempt:  Per PCP when ready Arrange follow up:   Follow up per Primary Care planned

## 2017-12-28 NOTE — Telephone Encounter (Signed)
Routing message to MW to see if he has completed FMLA/Disability forms on pt at this time.  MW please advise.

## 2018-01-02 NOTE — Telephone Encounter (Signed)
Rec'd completed forms back from [employer] to [employer] via [employer] -pr

## 2018-01-02 NOTE — Telephone Encounter (Signed)
Noted. Darlene [name] has the forms. Nothing further needed at this time.

## 2018-01-02 NOTE — Telephone Encounter (Signed)
Please advise if FMLA paperwork has been completed and returned to [place]. Thanks.

## 2018-01-02 NOTE — Telephone Encounter (Signed)
done

## 2018-01-02 NOTE — Telephone Encounter (Signed)
Forms returned to [facility]

## 2018-01-12 ENCOUNTER — Telehealth: Payer: Self-pay | Admitting: Internal Medicine

## 2018-01-12 NOTE — Telephone Encounter (Signed)
Will await forms.

## 2018-01-13 NOTE — Telephone Encounter (Signed)
At this time no forms rec'd, will check again  Attempted to call patient today. I did not receive an answer at time of call. I have left a voicemail message for pt to return call. X1

## 2018-01-13 NOTE — Telephone Encounter (Signed)
Disregard note, see other note from same day.

## 2018-01-16 NOTE — Telephone Encounter (Signed)
At this time no forms rec'd, will check again  Attempted to call patient today. I did not receive an answer at time of call. I have left a voicemail message forptto return call. X2

## 2018-01-17 NOTE — Telephone Encounter (Signed)
Called spoke with patient who reported that MW had signed some disability forms for her at the last office visit. However, her HR is requiring the forms be dated between 8/1 and 8/10 so they forms will need to be signed again.  Patient will bring the forms to the office for MW to re-sign and is aware our office will call her to pick these up when ready.  Will route back to triage to await receipt of forms before routing to MW.

## 2018-01-17 NOTE — Telephone Encounter (Signed)
Patient dropped off forms to be signed.  These are in [facility] folder up front.  Please call her at [phone] when ready to pick up.

## 2018-01-17 NOTE — Telephone Encounter (Signed)
Forms have been placed in MW's look at. Will route message to [facility] for follow up.

## 2018-01-19 ENCOUNTER — Other Ambulatory Visit (HOSPITAL_COMMUNITY): Payer: Self-pay | Admitting: Neurosurgery

## 2018-01-19 ENCOUNTER — Telehealth: Payer: Self-pay | Admitting: Internal Medicine

## 2018-01-19 DIAGNOSIS — R030 Elevated blood-pressure reading, without diagnosis of hypertension: Secondary | ICD-10-CM

## 2018-01-19 NOTE — Telephone Encounter (Signed)
Pt is calling back, checking status of forms.  Forms are on MW's desk.  Pt is aware of this, would like a call back when forms are completed.  Pt is requesting this asap.  MW please advise when paper has been completed.  Thanks!

## 2018-01-19 NOTE — Telephone Encounter (Signed)
Pt is calling would like an update on forms. Cb is [phone].

## 2018-01-19 NOTE — Telephone Encounter (Signed)
Forms placed in Dr Gustavus Bryant lookat

## 2018-01-20 ENCOUNTER — Telehealth: Payer: Self-pay | Admitting: Internal Medicine

## 2018-01-20 NOTE — Telephone Encounter (Signed)
Forms were done and given to [facility]  I called back the number provided and they could not pull up the pt by her name and no reference number was provided so I am closing this encounter

## 2018-01-20 NOTE — Telephone Encounter (Signed)
Rec'd completed forms back from [place] to [employer] via [employer] - pr

## 2018-01-20 NOTE — Telephone Encounter (Signed)
Routing message to MW to please advise once forms are signed and completed.

## 2018-02-06 ENCOUNTER — Ambulatory Visit (HOSPITAL_COMMUNITY): Payer: BC Managed Care – PPO

## 2018-03-22 ENCOUNTER — Ambulatory Visit: Payer: BC Managed Care – PPO | Admitting: Internal Medicine

## 2018-03-22 ENCOUNTER — Other Ambulatory Visit (INDEPENDENT_AMBULATORY_CARE_PROVIDER_SITE_OTHER): Payer: BC Managed Care – PPO

## 2018-03-22 ENCOUNTER — Encounter: Payer: Self-pay | Admitting: Internal Medicine

## 2018-03-22 VITALS — BP 124/74 | HR 146 | Ht 64.0 in | Wt 84.0 lb

## 2018-03-22 DIAGNOSIS — R0609 Other forms of dyspnea: Secondary | ICD-10-CM | POA: Insufficient documentation

## 2018-03-22 DIAGNOSIS — J449 Chronic obstructive pulmonary disease, unspecified: Secondary | ICD-10-CM | POA: Diagnosis not present

## 2018-03-22 DIAGNOSIS — Z23 Encounter for immunization: Secondary | ICD-10-CM

## 2018-03-22 DIAGNOSIS — F17210 Nicotine dependence, cigarettes, uncomplicated: Secondary | ICD-10-CM

## 2018-03-22 DIAGNOSIS — J9611 Chronic respiratory failure with hypoxia: Secondary | ICD-10-CM

## 2018-03-22 DIAGNOSIS — J9612 Chronic respiratory failure with hypercapnia: Secondary | ICD-10-CM

## 2018-03-22 LAB — CBC WITH DIFFERENTIAL/PLATELET
Basophils Absolute: 0.1 10*3/uL (ref 0.0–0.1)
Basophils Relative: 0.8 % (ref 0.0–3.0)
EOS ABS: 0.2 10*3/uL (ref 0.0–0.7)
Eosinophils Relative: 1.6 % (ref 0.0–5.0)
HEMATOCRIT: 45.1 % (ref 36.0–46.0)
Hemoglobin: 15.1 g/dL — ABNORMAL HIGH (ref 12.0–15.0)
LYMPHS ABS: 1.8 10*3/uL (ref 0.7–4.0)
LYMPHS PCT: 18.4 % (ref 12.0–46.0)
MCHC: 33.5 g/dL (ref 30.0–36.0)
MCV: 104.4 fl — ABNORMAL HIGH (ref 78.0–100.0)
MONOS PCT: 8.1 % (ref 3.0–12.0)
Monocytes Absolute: 0.8 10*3/uL (ref 0.1–1.0)
NEUTROS ABS: 6.9 10*3/uL (ref 1.4–7.7)
Neutrophils Relative %: 71.1 % (ref 43.0–77.0)
PLATELETS: 262 10*3/uL (ref 150.0–400.0)
RBC: 4.32 Mil/uL (ref 3.87–5.11)
RDW: 13.6 % (ref 11.5–15.5)
WBC: 9.7 10*3/uL (ref 4.0–10.5)

## 2018-03-22 LAB — TSH: TSH: 0.91 u[IU]/mL (ref 0.35–4.50)

## 2018-03-22 LAB — BASIC METABOLIC PANEL
BUN: 14 mg/dL (ref 6–23)
CHLORIDE: 100 meq/L (ref 96–112)
CO2: 35 mEq/L — ABNORMAL HIGH (ref 19–32)
Calcium: 10.1 mg/dL (ref 8.4–10.5)
Creatinine, Ser: 0.49 mg/dL (ref 0.40–1.20)
GFR: 135.41 mL/min (ref >60.00)
GLUCOSE: 101 mg/dL — AB (ref 70–99)
Potassium: 4.2 mEq/L (ref 3.5–5.1)
SODIUM: 140 meq/L (ref 135–145)

## 2018-03-22 LAB — BRAIN NATRIURETIC PEPTIDE: Pro B Natriuretic peptide (BNP): 33 pg/mL (ref 0.0–100.0)

## 2018-03-22 MED ORDER — FAMOTIDINE 20 MG PO TABS: ORAL_TABLET | ORAL | 11 refills | Status: AC

## 2018-03-22 MED ORDER — FLUTTER DEVI: 0 refills | Status: AC

## 2018-03-22 NOTE — Progress Notes (Signed)
Subjective:   Patient ID: Darlene [name], female    DOB: [birth year]    MRN: [id number]      Brief patient profile:  76  yowf active smoker dx with remote dx copd  By Clance self referred to pulmonary clinic 12/29/2012 with progressive doe x 06/2012 dx with GOLD III COPD 02/02/13      History of Present Illness  12/29/2012 1st pulmonary eval in EPIC era / [facility]  cc indolent onset progressive doe x 6 month getting groceries from car to [employer],  crossing parking lots. In am lots watery nasal congestion and cough > 30 min each am, assoc with chest discomfort midline during cough, prod sev tbsp white mucus,, some better p saba uses 4-5 x day.   rec Continue symbicort 160 Take 2 puffs first thing in am and then another 2 puffs about 12 hours later. Only use your albuterol as a rescue medication  Work on inhaler technique:      09/19/2017  f/u ov/Chandlar Guice re:  GOLD III copd/ has 02 but not using or monitoring her sats / resumed smoking / Chief Complaint  Patient presents with  . Follow-up    Breathing has been slightly worse "might be my nerves". She has a prod cough with white sputum.  She has been using her albuterol inhaler 3-4 x per day.   Dyspnea:  Slow walking across parking lot  s stopping = MMRC2 = can't walk a nl pace on a flat grade s sob but does fine slow and flat s 02  Cough: white mucus/ mostly in am   Sleep: ok in recliner x 45 degrees SABA use: mostly in day with cleaning/ unloading groceries   Also limited by back from exertion rec  Plan A = Automatic = symbicort 160 Take 2 puffs first thing in am and then another 2 puffs about 12 hours later.  Spiriva is used each am  = one capsule or two puffs off the green or 4 off the blue Work on inhaler technique:     Plan B = Backup Only use your albuterol (proair) as a rescue medication    10/27/2017  f/u ov/Rawlins Stuard re: copd gold III  Using 3lpm hs / maint symb/spiriva Chief Complaint  Patient presents with  . Follow-up    SOB,  productive cough (white sputum), wheezing, O2 at bedtime and a couple times a day, using inhalers and rescue inhaler more than 4 times a day  Dyspnea: worse= MMRC3 = can't walk 100 yards even at a slow pace at a flat grade s stopping due to sob  And not using portable 02/ using 3lpm hs only  Cough: some noct cough/ mucoid  Sleep: at 45 degrees SABA use:  4 x daily as noted  Work requirement  Does not have hc sticker, sits at desk but back pain is the limiting issue with deskwork   rec I am going to defer bone density to you, Dr Inda Merlin and Dr Marlou Sa but it would appear you need a medication to build your bones and prolia would probably be the best choice as it has not adverse effect on your swallowing (IV reclast similar) Because of your copd and spinal fractures I don't rec you resume work and doubt you will be able to return to work effecitve Oct 24 2017  Work on inhaler technique    12/19/2017  f/u ov/Blinda Turek re: copd GOLD III / still smoking  Chief Complaint  Patient presents with  .  Follow-up    Breathing is doing okay. She states she is using her albuterol inhaler 3-4 x per day on average.    Dyspnea:  Can do [employer] on 02  2.5 lpm slow pace but nothing bigger = MMRC3  Cough: some in am's > white   02: 2.5 sleeping/ none at rest/ with activity  Uses prn  3lpm continue Sleeping: at 45  degreees / recliner       03/22/2018  f/u ov/Kaysen Sefcik re: copd  GOLD III/ still smoking  Chief Complaint  Patient presents with  . Follow-up    Breathing is unchanged. She is using her albuterol inhaler 3-4 x per day.    Dyspnea:  [employer] pushing cart 3lpm never checks sats with 02  but down to 70s without 02  Cough: some rattling esp am Sleeping: 2.5 lpm at 45 degrees  SABA use: 3-4 x per day including first thing in am due to am congestion    No obvious day to day or daytime variability or assoc excess/ purulent sputum or mucus plugs or hemoptysis or cp or chest tightness, subjective wheeze or overt  sinus or hb symptoms.   Sleeping as above without nocturnal  or early am exacerbation  of respiratory  c/o's or need for noct saba. Also denies any obvious fluctuation of symptoms with weather or environmental changes or other aggravating or alleviating factors except as outlined above   No unusual exposure hx or h/o childhood pna/ asthma or knowledge of premature birth.  Current Allergies, Complete Past Medical History, Past Surgical History, Family History, and Social History were reviewed in [employer] record.  ROS  The following are not active complaints unless bolded Hoarseness, sore throat, dysphagia, dental problems, itching, sneezing,  nasal congestion or discharge of excess mucus or purulent secretions, ear ache,   fever, chills, sweats, unintended wt loss or wt gain, classically pleuritic or exertional cp,  orthopnea pnd or arm/hand swelling  or leg swelling, presyncope, palpitations, abdominal pain, anorexia, nausea, vomiting, diarrhea  or change in bowel habits or change in bladder habits, change in stools or change in urine, dysuria, hematuria,  rash, arthralgias, visual complaints, headache, numbness, weakness or ataxia or problems with walking or coordination,  change in mood or  memory.        Current Meds  Medication Sig  . acetaminophen (TYLENOL) 500 MG tablet Take 500-1,000 mg by mouth every 6 (six) hours as needed (for pain).   Marland Kitchen albuterol (PROVENTIL HFA;VENTOLIN HFA) 108 (90 Base) MCG/ACT inhaler TAKE 2 PUFFS BY MOUTH EVERY 6 HOURS AS NEEDED FOR WHEEZE OR SHORTNESS OF BREATH  . alendronate (FOSAMAX) 70 MG tablet Take 70 mg by mouth once a week. Take with a full glass of water on an empty stomach.  . ALPRAZolam (XANAX) 0.25 MG tablet Take 0.25 mg by mouth daily.   . Calcium Carb-Cholecalciferol (CALCIUM+D3 PO) Take 1 tablet by mouth 2 (two) times a week.  . DULoxetine (CYMBALTA) 60 MG capsule Take 60 mg by mouth daily.  Marland Kitchen SPIRIVA RESPIMAT 2.5 MCG/ACT  AERS INHALE 2 PUFFS INTO THE LUNGS ONCE DAILY  . SYMBICORT 160-4.5 MCG/ACT inhaler INHALE 2 PUFFS BY MOUTH TWICE A DAY  . traMADol (ULTRAM) 50 MG tablet Take 50 mg by mouth every 6 (six) hours as needed (for pain).   . Vitamin D, Ergocalciferol, (DRISDOL) 50000 units CAPS capsule Take 50,000 Units by mouth every 14 (fourteen) days.  Objective:   Physical Exam    amb wf nad    Vital signs reviewed - Note on arrival 02 sats  98% on RA       03/22/2018        85  12/19/2017         82 10/27/2017       83 09/19/2017         87  01/28/2017       88  08/04/16 93 lb (42.2 kg)  04/22/15 102 lb (46.3 kg)  03/11/15 104 lb (47.2 kg)      02/02/13 91 lb (41.277 kg)  12/29/12 92 lb 9.6 oz (42.003 kg)         HEENT: nl dentition / oropharynx. Nl external ear canals without cough reflex -  Midl bilateral non-specific turbinate edema     NECK :  without JVD/Nodes/TM/ nl carotid upstrokes bilaterally   LUNGS: no acc muscle use,  Mod barrel  contour chest wall with bilateral  Distant bs s audible wheeze and  without cough on insp or exp maneuver and mod  Hyperresonant  to  percussion bilaterally     CV:  RRR apical rate 125   no s3 or murmur or increase in P2, and no edema   ABD:  soft and nontender with pos mid  insp Hoover's  in the supine position. No bruits or organomegaly appreciated, bowel sounds nl  MS:   Nl gait/  ext warm without deformities, calf tenderness, cyanosis or clubbing No obvious joint restrictions   SKIN: warm and dry without lesions    NEURO:  alert, approp, nl sensorium with  no motor or cerebellar deficits apparent.    Labs ordered/ reviewed:      Chemistry      Component Value Date/Time   NA 140 03/22/2018 1418   K 4.2 03/22/2018 1418   CL 100 03/22/2018 1418   CO2 35 (H) 03/22/2018 1418   BUN 14 03/22/2018 1418   CREATININE 0.49 03/22/2018 1418      Component Value Date/Time   CALCIUM 10.1 03/22/2018 1418   ALKPHOS 72 08/16/2017  1756   AST 20 08/16/2017 1756   ALT 8 (L) 08/16/2017 1756   BILITOT 1.4 (H) 08/16/2017 1756        Lab Results  Component Value Date   WBC 9.7 03/22/2018   HGB 15.1 (H) 03/22/2018   HCT 45.1 03/22/2018   MCV 104.4 (H) 03/22/2018   PLT 262.0 03/22/2018       Lab Results  Component Value Date   TSH 0.91 03/22/2018     Lab Results  Component Value Date   PROBNP 33.0 03/22/2018              Assessment & Plan:

## 2018-03-22 NOTE — Assessment & Plan Note (Signed)
-   PFT's 02/02/2013   FEV1 1.28 (50%)   p  33% response to saba  with ratio 47 and DLCO 66 corrects to 73  - hfa 75 % 12/29/2012  > 75% 02/02/2013  -   alpha testing 02/02/2013 > MM - 08/04/2016    try bevespi > no decrease saba use - 01/28/2017    rechallenge with symb 160 2bid  - 06/22/2017      50% > try add spiriva respimat> pharmacy substitued the dpi  - Spirometry 06/22/2017  FEV1 0.97 (39%)  Ratio 46 though < 6 sec exp   - 09/19/2017   resume spiriva smi  - 03/22/2018  After extensive coaching inhaler device,  effectiveness =    90% with smi - 03/22/2018 add flutter    Discussed:  I spent extra time with pt today reviewing appropriate use of albuterol for prn use on exertion with the following points: 1) saba is for relief of sob that does not improve by walking a slower pace or resting but rather if the pt does not improve after trying this first. 2) If the pt is convinced, as many are, that saba helps recover from activity faster then it's easy to tell if this is the case by re-challenging : ie stop, take the inhaler, then p 5 minutes try the exact same activity (intensity of workload) that just caused the symptoms and see if they are substantially diminished or not after saba 3) if there is an activity that reproducibly causes the symptoms, try the saba 15 min before the activity on alternate days   If in fact the saba really does help, then fine to continue to use it prn but advised may need to look closer at the maintenance regimen being used to achieve better control of airways disease with exertion.

## 2018-03-22 NOTE — Patient Instructions (Addendum)
Add pepcid 20 mg over the counter at bedtime   For cough mucinex 600 mg up to 2 every 12 hours and use the flutter valve as much you can    Only use your albuterol as a rescue medication to be used if you can't catch your breath by resting or doing a relaxed purse lip breathing pattern.  - The less you use it, the better it will work when you need it. - Ok to use up to 2 puffs  every 4 hours if you must but call for immediate appointment if use goes up over your usual need - Don't leave home without it !!  (think of it like the spare tire for your car)   Please remember to go to the lab department downstairs in the basement  for your tests - we will call you with the results when they are available.      Please schedule a follow up visit in 3 months but call sooner if needed

## 2018-03-23 ENCOUNTER — Encounter: Payer: Self-pay | Admitting: Internal Medicine

## 2018-03-23 DIAGNOSIS — J9612 Chronic respiratory failure with hypercapnia: Secondary | ICD-10-CM

## 2018-03-23 DIAGNOSIS — J9611 Chronic respiratory failure with hypoxia: Secondary | ICD-10-CM | POA: Insufficient documentation

## 2018-03-23 NOTE — Assessment & Plan Note (Signed)
On 3lpm hs chronically 10/27/2017   Walked RA  2 laps @ 185 ft each stopped due to  Sob/ no desats at moderate pace  - HCO3  03/22/2018  = 35   Though somewhat paradoxic, when the lung fails to clear C02 properly and pC02 rises the lung then becomes a more efficient scavenger of C02 allowing lower work of breathing and  better C02 clearance albeit at a higher serum pC02 level - this is why pts can look a lot better than their ABG's would suggest and why it's so difficult to prognosticate endstage dz.  It's also why I strongly rec DNI status (ventilating pts down to a nl pC02 adversely affects this compensatory mechanism)   Goal is to keep sats in low 90s/ reviewed daytime monitoring needed

## 2018-03-23 NOTE — Assessment & Plan Note (Signed)
4-5 min discussion re active cigarette smoking in addition to office E&M  Ask about tobacco use:   ongoing Advise quitting   I took an extended  opportunity with this patient to outline the consequences of continued cigarette use  in airway disorders based on all the data we have from the multiple national lung health studies (perfomed over decades at millions of dollars in cost)  indicating that smoking cessation, not choice of inhalers or physicians, is the most important aspect of care.   Assess willingness:  Not committed at this point Assist in quit attempt:  Per PCP when ready Arrange follow up:   Follow up per Primary Care planned      I had an extended discussion with the patient reviewing all relevant studies completed to date and  lasting 15 to 20 minutes of a 25 minute visit    See device teaching which extended face to face time for this visit (flutter valve training)   Each maintenance medication was reviewed in detail including emphasizing most importantly the difference between maintenance and prns and under what circumstances the prns are to be triggered using an action plan format that is not reflected in the computer generated alphabetically organized AVS which I have not found useful in most complex patients, especially with respiratory illnesses  Please see AVS for specific instructions unique to this visit that I personally wrote and verbalized to the the pt in detail and then reviewed with pt  by my nurse highlighting any  changes in therapy recommended at today's visit to their plan of care.

## 2018-03-23 NOTE — Progress Notes (Signed)
Left detailed msg on machine ok per DPR

## 2018-03-23 NOTE — Assessment & Plan Note (Signed)
No evidence of anemia/ chf/ thyroid dz > see copd

## 2018-04-13 ENCOUNTER — Telehealth: Payer: Self-pay | Admitting: Internal Medicine

## 2018-04-13 NOTE — Telephone Encounter (Signed)
Attempted to contact pt. I did not receive an answer. I have left a message for pt to return our call.  

## 2018-04-14 NOTE — Telephone Encounter (Signed)
Attempted to call pt but unable to reach her. Left message for pt to return call. 

## 2018-04-14 NOTE — Telephone Encounter (Signed)
Called and spoke with pt who stated she is currently using 3L continuous but wanted to know if she could be able to be provide a POC.  Stated to pt we needed her to come in for a qualifying walk on POC to see if she would be suitable for the POC due to the amount of liters continuous flow she requires.  Pt expressed understanding. Scheduled pt an OV Monday, 04/17/18 at 2:30. Nothing further needed.

## 2018-04-14 NOTE — Telephone Encounter (Signed)
Patient returned phone call; pt contact # [phone]

## 2018-04-17 ENCOUNTER — Ambulatory Visit: Payer: BC Managed Care – PPO

## 2018-04-26 ENCOUNTER — Encounter: Payer: Self-pay | Admitting: Internal Medicine

## 2018-04-26 NOTE — Telephone Encounter (Signed)
Opened message by mistake

## 2018-05-04 ENCOUNTER — Encounter: Payer: Self-pay | Admitting: Pulmonary Disease

## 2018-05-04 ENCOUNTER — Ambulatory Visit: Payer: BC Managed Care – PPO

## 2018-05-04 ENCOUNTER — Ambulatory Visit (INDEPENDENT_AMBULATORY_CARE_PROVIDER_SITE_OTHER): Payer: BC Managed Care – PPO | Admitting: Pulmonary Disease

## 2018-05-04 VITALS — BP 100/72 | HR 103 | Ht 63.0 in | Wt 88.4 lb

## 2018-05-04 DIAGNOSIS — F17210 Nicotine dependence, cigarettes, uncomplicated: Secondary | ICD-10-CM | POA: Diagnosis not present

## 2018-05-04 DIAGNOSIS — J449 Chronic obstructive pulmonary disease, unspecified: Secondary | ICD-10-CM

## 2018-05-04 DIAGNOSIS — J439 Emphysema, unspecified: Secondary | ICD-10-CM

## 2018-05-04 DIAGNOSIS — Z720 Tobacco use: Secondary | ICD-10-CM

## 2018-05-04 DIAGNOSIS — J9611 Chronic respiratory failure with hypoxia: Secondary | ICD-10-CM

## 2018-05-04 NOTE — Patient Instructions (Addendum)
Continue Spiriva Respimat 2.5 >>> 2 puffs daily >>> Do this every day >>>This is not a rescue inhaler  Check formulary on Stiolto Respimat coverage if so we can start this instead of your Spiriva  We recommend that you stop smoking.  >>>You need to set a quit date >>>If you have friends or family who smoke, let them know you are trying to quit and not to smoke around you or in your living environment  Smoking Cessation Resources:  1 800 QUIT NOW  >>> Patient to call this resource and utilize it to help support her quit smoking >>> Keep up your hard work with stopping smoking  You can also contact the [facility] >>>For smoking cessation classes call [phone]  We do not recommend using e-cigarettes as a form of stopping smoking  You can sign up for smoking cessation support texts and information:  >>>https://smokefree.gov/smokefreetxt   Nicotine patches: >>>Make sure you rotate sites that you do not get skin irritation, Apply 1 patch each morning to a non-hairy skin site  If you are smoking greater than 10 cigarettes/day and weigh over 45 kg start with the nicotine patch of 21 mg a day for 6 weeks, then 14 mg a day for 2 weeks, then finished with 7 mg a day for 2 weeks, then stop  If you are smoking less than 10 cigarettes a day or weight less than 45 kg start with medium dose pack of 14 mg a day for 6 weeks, followed by 7 mg a day for 2 weeks   >>>If insomnia occurs you are having trouble sleeping you can take the patch off at night, and place a new one on in the morning >>>If the patch is removed at night and you have morning cravings start short acting nicotine replacement therapy such as gum or lozenges  Nicotine Gum:  >>>Smokers who smoke 25 or more cigarettes a day should use 4 mg dose >>>Smokers who smoke fewer than 25 cigarettes a day should use 2 mg dose  Proper chewing of gum is important for optimal results.   >>>Do not chew gum to rapidly.  Once  you start chewing eating tasty peppery taste and slide the gum to your cheek.  When the taste disappears to a few more times.  Repeat this for 30 minutes.  Then discard the gum.  >>>Avoid acidic beverages such as coffee, carbonated beverages before and during gum use.  A soft acidic beverages lower oral pH which cause nicotine to not be absorbed properly >>>If you chew the gum too quickly or vigorously you could have nausea, vomiting, abdominal pain, constipation, hiccups, headache, sore jaw, mouth irritation ulcers  Nicotine lozenge: Lozenges are commonly uses short acting NRT product  >>>Smokers who smoke within 30 minutes of awakening should use 4 mg dose  Can use up to 1 lozenge every 1-2 hours for 6 weeks >>>Total amount of lozenges that can be used per day as 20 >>>Gradually reduce number of lozenges used per day after 2 weeks of use  Place lozenge in mouth and allowed to dissolve for 30 minutes loss and does not need to be chewed  Lozenges have advantages to be able to be used in people with TMG, poor dentition, dentures   We will place an order for pulmonary rehab   Keep follow-up with our office in January/2020 or sooner if symptoms worsen  It is flu season:   >>>Remember to be washing your hands regularly, using hand sanitizer,  be careful to use around herself with has contact with people who are sick will increase her chances of getting sick yourself. >>> Best ways to protect herself from the flu: Receive the yearly flu vaccine, practice good hand hygiene washing with soap and also using hand sanitizer when available, eat a nutritious meals, get adequate rest, hydrate appropriately   Please contact the office if your symptoms worsen or you have concerns that you are not improving.   Thank you for choosing [facility] for your healthcare, and for allowing [place] to partner with you on your healthcare journey. I am thankful to be able to provide care to you today.    Wyn Quaker FNP-C      Home Oxygen Use, Adult When a medical condition keeps you from getting enough oxygen, your health care provider may instruct you to take extra oxygen at home. Your health care provider will let you know:  When to take oxygen.  For how long to take oxygen.  How quickly oxygen should be delivered (flow rate), in liters per minute (LPM or L/M).  Home oxygen can be given through:  A mask.  A nasal cannula. This is a device or tube that goes in the nostrils.  A transtracheal catheter. This is a small, flexible tube placed in the trachea.  A tracheostomy. This is a surgically made opening in the trachea.  These devices are connected with tubing to an oxygen source, such as:  A tank. Tanks hold oxygen in gas form. They must be replaced when the oxygen is used up.  A liquid oxygen device. This holds oxygen in liquid form. It must be replaced when the oxygen is used up.  An oxygen concentrator machine. This filters oxygen in the room. It uses electricity, so you must have a backup cylinder of oxygen in case the power goes out.  Supplies needed: To use oxygen, you will need:  A mask, nasal cannula, transtracheal catheter, or tracheostomy.  An oxygen tank, a liquid oxygen device, or an oxygen concentrator.  The tape that your health care provider recommends (optional).  If you use a transtracheal catheter and your prescribed flow rate is 1 LPM or greater, you will also need a humidifier. Risks and complications  Fire. This can happen if the oxygen is exposed to a heat source, flame, or spark.  Injury to skin. This can happen if liquid oxygen touches your skin.  Organ damage. This can happen if you get too little oxygen. How to use oxygen Your health care provider will show you how to use your oxygen device. Follow her or his instructions. They may look something like this: 1. Wash your hands. 2. If you use an oxygen concentrator, make sure it is  plugged in. 3. Place one end of the tube into the port on the tank, device, or machine. 4. Place the mask over your nose and mouth. Or, place the nasal cannula and secure it with tape if instructed. If you use a tracheostomy or transtracheal catheter, connect it to the oxygen source as directed. 5. Make sure the liter-flow setting on the machine is at the level prescribed by your health care provider. 6. Turn on the machine or adjust the knob on the tank or device to the correct liter-flow setting. 7. When you are done, turn off and unplug the machine, or turn the knob to OFF.  How to clean and care for the oxygen supplies Nasal cannula  Clean it with a  warm, wet cloth daily or as needed.  Wash it with a liquid soap once a week.  Rinse it thoroughly once or twice a week.  Replace it every 2-4 weeks.  If you have an infection, such as a cold or pneumonia, change the cannula when you get better. Mask  Replace it every 2-4 weeks.  If you have an infection, such as a cold or pneumonia, change the mask when you get better. Humidifier bottle  Wash the bottle between each refill: ? Wash it with soap and warm water. ? Rinse it thoroughly. ? Disinfect it and its top. ? Air-dry it.  Make sure it is dry before you refill it. Oxygen concentrator  Clean the air filter at least twice a week according to directions from your home medical equipment and service company.  Wipe down the cabinet every day. To do this: ? Unplug the unit. ? Wipe down the cabinet with a damp cloth. ? Dry the cabinet. Other equipment  Change any extra tubing every 1-3 months.  Follow instructions from your health care provider about taking care of any other equipment. Safety tips Fire safety tips   Keep your oxygen and oxygen supplies at least 5 ft away from sources of heat, flames, and sparks at all times.  Do not allow smoking near your oxygen. Put up "no smoking" signs in your home.  Do not use  materials that can burn (are flammable) while you use oxygen.  When you go to a restaurant with portable oxygen, ask to be seated in the nonsmoking section.  Keep a Data processing manager close by. Let your fire department know that you have oxygen in your home.  Test your home smoke detectors regularly. General safety tips  If you use an oxygen cylinder, make sure it is in a stand or secured to an object that will not move (fixed object).  If you use liquid oxygen, make sure its container is kept upright.  If you use an oxygen concentrator: ? [employer] company. Make sure you are given priority service in the event that your power goes out. ? Avoid using extension cords, if possible. Follow these instructions at home:  Use oxygen only as told by your health care provider.  Do not use alcohol or other drugs that make you relax (sedating drugs) unless instructed. They can slow down your breathing rate and make it hard to get in enough oxygen.  Know how and when to order a refill of oxygen.  Always keep a spare tank of oxygen. Plan ahead for holidays when you may not be able to get a prescription filled.  Use water-based lubricants on your lips or nostrils. Do not use oil-based products like petroleum jelly.  To prevent skin irritation on your cheeks or behind your ears, tuck some gauze under the tubing. Contact a health care provider if:  You get headaches often.  You have shortness of breath.  You have a lasting cough.  You have anxiety.  You are sleepy all the time.  You develop an illness that affects your breathing.  You cannot exercise at your regular level.  You are restless.  You have difficult or irregular breathing, and it is getting worse.  You have a fever.  You have persistent redness under your nose. Get help right away if:  You are confused.  You have blue lips or fingernails.  You are struggling to breathe. This information is not intended  to replace advice given to  you by your health care provider. Make sure you discuss any questions you have with your health care provider. Document Released: 08/21/2003 Document Revised: 01/28/2016 Document Reviewed: 12/23/2015 Elsevier Interactive Patient Education  2018 [place] with Quitting Smoking Quitting smoking is a physical and mental challenge. You will face cravings, withdrawal symptoms, and temptation. Before quitting, work with your health care provider to make a plan that can help you cope. Preparation can help you quit and keep you from giving in. How can I cope with cravings? Cravings usually last for 5-10 minutes. If you get through it, the craving will pass. Consider taking the following actions to help you cope with cravings:  Keep your mouth busy: ? Chew sugar-free gum. ? Suck on hard candies or a straw. ? Brush your teeth.  Keep your hands and body busy: ? Immediately change to a different activity when you feel a craving. ? Squeeze or play with a ball. ? Do an activity or a hobby, like making bead jewelry, practicing needlepoint, or working with wood. ? Mix up your normal routine. ? Take a short exercise break. Go for a quick walk or run up and down stairs. ? Spend time in public places where smoking is not allowed.  Focus on doing something kind or helpful for someone else.  Call a friend or family member to talk during a craving.  Join a support group.  Call a quit line, such as 1-800-QUIT-NOW.  Talk with your health care provider about medicines that might help you cope with cravings and make quitting easier for you.  How can I deal with withdrawal symptoms? Your body may experience negative effects as it tries to get used to not having nicotine in the system. These effects are called withdrawal symptoms. They may include:  Feeling hungrier than normal.  Trouble concentrating.  Irritability.  Trouble sleeping.  Feeling  depressed.  Restlessness and agitation.  Craving a cigarette.  To manage withdrawal symptoms:  Avoid places, people, and activities that trigger your cravings.  Remember why you want to quit.  Get plenty of sleep.  Avoid coffee and other caffeinated drinks. These may worsen some of your symptoms.  How can I handle social situations? Social situations can be difficult when you are quitting smoking, especially in the first few weeks. To manage this, you can:  Avoid parties, bars, and other social situations where people might be smoking.  Avoid alcohol.  Leave right away if you have the urge to smoke.  Explain to your family and friends that you are quitting smoking. Ask for understanding and support.  Plan activities with friends or family where smoking is not an option.  What are some ways I can cope with stress? Wanting to smoke may cause stress, and stress can make you want to smoke. Find ways to manage your stress. Relaxation techniques can help. For example:  Breathe slowly and deeply, in through your nose and out through your mouth.  Listen to soothing, relaxing music.  Talk with a family member or friend about your stress.  Light a candle.  Soak in a bath or take a shower.  Think about a peaceful place.  What are some ways I can prevent weight gain? Be aware that many people gain weight after they quit smoking. However, not everyone does. To keep from gaining weight, have a plan in place before you quit and stick to the plan after you quit. Your plan  should include:  Having healthy snacks. When you have a craving, it may help to: ? Eat plain popcorn, crunchy carrots, celery, or other cut vegetables. ? Chew sugar-free gum.  Changing how you eat: ? Eat small portion sizes at meals. ? Eat 4-6 small meals throughout the day instead of 1-2 large meals a day. ? Be mindful when you eat. Do not watch television or do other things that might distract you as you  eat.  Exercising regularly: ? Make time to exercise each day. If you do not have time for a long workout, do short bouts of exercise for 5-10 minutes several times a day. ? Do some form of strengthening exercise, like weight lifting, and some form of aerobic exercise, like running or swimming.  Drinking plenty of water or other low-calorie or no-calorie drinks. Drink 6-8 glasses of water daily, or as much as instructed by your health care provider.  Summary  Quitting smoking is a physical and mental challenge. You will face cravings, withdrawal symptoms, and temptation to smoke again. Preparation can help you as you go through these challenges.  You can cope with cravings by keeping your mouth busy (such as by chewing gum), keeping your body and hands busy, and making calls to family, friends, or a helpline for people who want to quit smoking.  You can cope with withdrawal symptoms by avoiding places where people smoke, avoiding drinks with caffeine, and getting plenty of rest.  Ask your health care provider about the different ways to prevent weight gain, avoid stress, and handle social situations. This information is not intended to replace advice given to you by your health care provider. Make sure you discuss any questions you have with your health care provider. Document Released: 05/28/2016 Document Revised: 05/28/2016 Document Reviewed: 05/28/2016 Elsevier Interactive Patient Education  [employer].

## 2018-05-04 NOTE — Progress Notes (Signed)
Chart and office note reviewed in detail  > agree with a/p as outlined    

## 2018-05-04 NOTE — Assessment & Plan Note (Signed)
Walked in office today with no desaturations on room air Continue 3 L of O2 at night

## 2018-05-04 NOTE — Assessment & Plan Note (Signed)
Will defer lung cancer screening at this time as patient's FEV1 is 39%, she continues to smoke.  We recommend that you stop smoking.  >>>You need to set a quit date >>>If you have friends or family who smoke, let them know you are trying to quit and not to smoke around you or in your living environment  Smoking Cessation Resources:  1 800 QUIT NOW  >>> Patient to call this resource and utilize it to help support her quit smoking >>> Keep up your hard work with stopping smoking  You can also contact the [facility] >>>For smoking cessation classes call [phone]  We do not recommend using e-cigarettes as a form of stopping smoking  You can sign up for smoking cessation support texts and information:  >>>https://smokefree.gov/smokefreetxt   Nicotine patches: >>>Make sure you rotate sites that you do not get skin irritation, Apply 1 patch each morning to a non-hairy skin site  If you are smoking greater than 10 cigarettes/day and weigh over 45 kg start with the nicotine patch of 21 mg a day for 6 weeks, then 14 mg a day for 2 weeks, then finished with 7 mg a day for 2 weeks, then stop  If you are smoking less than 10 cigarettes a day or weight less than 45 kg start with medium dose pack of 14 mg a day for 6 weeks, followed by 7 mg a day for 2 weeks   >>>If insomnia occurs you are having trouble sleeping you can take the patch off at night, and place a new one on in the morning >>>If the patch is removed at night and you have morning cravings start short acting nicotine replacement therapy such as gum or lozenges  Nicotine Gum:  >>>Smokers who smoke 25 or more cigarettes a day should use 4 mg dose >>>Smokers who smoke fewer than 25 cigarettes a day should use 2 mg dose  Proper chewing of gum is important for optimal results.   >>>Do not chew gum to rapidly.  Once you start chewing eating tasty peppery taste and slide the gum to your cheek.  When the taste disappears  to a few more times.  Repeat this for 30 minutes.  Then discard the gum.  >>>Avoid acidic beverages such as coffee, carbonated beverages before and during gum use.  A soft acidic beverages lower oral pH which cause nicotine to not be absorbed properly >>>If you chew the gum too quickly or vigorously you could have nausea, vomiting, abdominal pain, constipation, hiccups, headache, sore jaw, mouth irritation ulcers  Nicotine lozenge: Lozenges are commonly uses short acting NRT product  >>>Smokers who smoke within 30 minutes of awakening should use 4 mg dose  Can use up to 1 lozenge every 1-2 hours for 6 weeks >>>Total amount of lozenges that can be used per day as 20 >>>Gradually reduce number of lozenges used per day after 2 weeks of use  Place lozenge in mouth and allowed to dissolve for 30 minutes loss and does not need to be chewed  Lozenges have advantages to be able to be used in people with TMG, poor dentition, dentures   We will place an order for pulmonary rehab  Keep follow-up with our office in January/2020 or sooner if symptoms worsen

## 2018-05-04 NOTE — Assessment & Plan Note (Signed)
Continue Spiriva Respimat 2.5 >>> 2 puffs daily >>> Do this every day >>>This is not a rescue inhaler  Check formulary on Stiolto Respimat coverage if so we can start this instead of your Spiriva  We recommend that you stop smoking.  >>>You need to set a quit date >>>If you have friends or family who smoke, let them know you are trying to quit and not to smoke around you or in your living environment  Smoking Cessation Resources:  1 800 QUIT NOW  >>> Patient to call this resource and utilize it to help support her quit smoking >>> Keep up your hard work with stopping smoking  You can also contact the [facility] >>>For smoking cessation classes call [phone]  We do not recommend using e-cigarettes as a form of stopping smoking  You can sign up for smoking cessation support texts and information:  >>>https://smokefree.gov/smokefreetxt  We will place an order for pulmonary rehab  Keep follow-up with our office in January/2020 or sooner if symptoms worsen

## 2018-05-04 NOTE — Progress Notes (Signed)
@Patient  ID: Darlene [name], female    DOB: 1955-09-27, 62 y.o.   MRN: [id number]  Chief Complaint  Patient presents with  . Follow-up    walk for O2    Referring provider: [name], MD  HPI:  62 year old female current every day smoker followed in office for COPD Gold III  PMH: Anxiety, severe protein calorie malnutrition, osteoporosis, previous history of pneumothorax Smoker/ Smoking History: Current everyday smoker. 42 pack year smoker.  Maintenance:  Spiriva 2.5 Respimat Pt of: [name]  05/04/2018  - Visit   62 year old female presenting today for follow-up visit.  Patient wanted to also be walked for a portable oxygen concentrator.  Patient was able to successfully walk 2 laps without oxygen saturations dropping below 98%.  Patient remains adherent to her Spiriva Respimat.  Patient still reports persistent dyspnea and shortness of breath.  Patient using Laurence Ferrari rescue inhaler 1-2 times a day.  Patient continues to smoke.  Patient has cut down her smoking to half a pack a day.  MMRC - Breathlessness Score 4 - I am too breathless to leave the house or I am breathlessness when dressing   Tests:   FENO:  No results found for: NITRICOXIDE  PFT: PFT Results Latest Ref Rng & Units 08/02/2014  FVC-Pre L 2.15  FVC-Predicted Pre % 67  FVC-Post L 2.52  FVC-Predicted Post % 79  Pre FEV1/FVC % % 39  Post FEV1/FCV % % 38  FEV1-Pre L 0.84  FEV1-Predicted Pre % 33  FEV1-Post L 0.97  DLCO UNC% % 41  DLCO COR %Predicted % 46  TLC L 5.44  TLC % Predicted % 111  RV % Predicted % 154    Imaging: No results found.  Chart Review:    Specialty Problems      Pulmonary Problems   COPD GOLD III     Followed in Pulmonary clinic/ [facility]/ [place] - PFT's 02/02/2013   FEV1 1.28 (50%)   p  33% response to saba  with ratio 47 and DLCO 66 corrects to 73  - hfa 75 % 12/29/2012  > 75% 02/02/2013  -   alpha testing 02/02/2013 > MM - 08/04/2016    try bevespi > no decrease  saba use - 01/28/2017    rechallenge with symb 160 2bid  - 06/22/2017      50% > try add spiriva respimat> pharmacy substitued the dpi  - Spirometry 06/22/2017  FEV1 0.97 (39%)  Ratio 46 though < 6 sec exp   - 09/19/2017   resume spiriva smi - 03/22/2018  After extensive coaching inhaler device,  effectiveness =    90% with smi - 03/22/2018 add flutter       Cough   Pulmonary nodules    PER CT CHEST ON 07/10/14... NEW 9 MM SLIGHTLY SPICULATED NODULE IN THE SUPERIOR ASPECT OF THE LEFT LOWER LOBE - PET 08/02/14 > 1. Rounded left lower lobe pulmonary nodule with trace metabolic activity. This combination of findings is not typical for bronchogenic carcinoma or carcinoma in situ and favors benign etiology. As this is a new finding, recommend follow-up CT thorax in 3 months.>  Dr Cyndia Bent rec f/u 3 m but never done  - CT chest 7/209/18 Lung nodules as described. The largest, which is cavitary, lies in the right upper lobe, measuring 20 x 9 mm. There is a new subcentimeter nodule in the inferior right upper lobe. 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 Codeine Nausea And Vomiting    Patient states she has tolerate this recently (??)    Immunization History  Administered Date(s) Administered  . Influenza, High Dose Seasonal PF 03/22/2018  . Influenza,inj,Quad PF,6+ Mos 03/14/2017  . Pneumococcal Polysaccharide-23 07/22/2011, 06/21/2017    Past Medical History:  Diagnosis Date  . Anxiety   . COPD (chronic obstructive pulmonary disease) ([place])   . Emphysema   . Substance abuse ([place])    tobacco dependance    Tobacco History: Social History   Tobacco Use  Smoking Status Current Every Day Smoker  . Packs/day: 0.50  . Years: 42.00  . Pack years: 21.00  . Types: Cigarettes  Smokeless Tobacco Never Used  Tobacco Comment   quit Jan 2019 for 15 days and has started back up   Ready to quit: Not Answered Counseling given: Yes Comment: quit Jan 2019 for 15 days and has started back up   Smoking assessment and cessation counseling  Patient currently smoking: 0.5 ppd  I have advised the patient to quit/stop smoking as soon as possible due to high risk for multiple medical problems.  It will also be very difficult for [place] to manage patient's  respiratory symptoms and status if we continue to expose her lungs to a known irritant.  We do not advise e-cigarettes as a form of stopping smoking.  Patient is willing to quit smoking. Has not set quit date.   I have advised the patient that we can assist and have options of nicotine replacement therapy, provided smoking cessation education today, provided smoking cessation counseling, and provided cessation resources. Did smoking cessation class in 2018.   Follow-up next office visit office visit for assessment of smoking cessation.  Smoking cessation counseling advised for: 51min    Outpatient Encounter Medications as of 05/04/2018  Medication  Sig  . acetaminophen (TYLENOL) 500 MG tablet Take 500-1,000 mg by mouth every 6 (six) hours as needed (for pain).   Marland Kitchen albuterol (PROVENTIL HFA;VENTOLIN HFA) 108 (90 Base) MCG/ACT inhaler TAKE 2 PUFFS BY MOUTH EVERY 6 HOURS AS NEEDED FOR WHEEZE OR SHORTNESS OF BREATH  . ALPRAZolam (XANAX) 0.25 MG tablet Take 0.25 mg by mouth daily.   . Calcium Carb-Cholecalciferol (CALCIUM+D3 PO) Take 1 tablet by mouth 2 (two) times  a week.  . famotidine (PEPCID) 20 MG tablet One at bedtime  . mirtazapine (REMERON) 15 MG tablet Take 15 mg by mouth at bedtime.  Marland Kitchen Respiratory Therapy Supplies (FLUTTER) DEVI Use as directed  . SPIRIVA RESPIMAT 2.5 MCG/ACT AERS INHALE 2 PUFFS INTO THE LUNGS ONCE DAILY  . SYMBICORT 160-4.5 MCG/ACT inhaler INHALE 2 PUFFS BY MOUTH TWICE A DAY  . traMADol (ULTRAM) 50 MG tablet Take 50 mg by mouth every 6 (six) hours as needed (for pain).   . Vitamin D, Ergocalciferol, (DRISDOL) 50000 units CAPS capsule Take 50,000 Units by mouth every 14 (fourteen) days.   Marland Kitchen alendronate (FOSAMAX) 70 MG tablet Take 70 mg by mouth once a week. Take with a full glass of water on an empty stomach.  . DULoxetine (CYMBALTA) 60 MG capsule Take 60 mg by mouth daily.   No facility-administered encounter medications on file as of 05/04/2018.      Review of Systems  Review of Systems  Constitutional: Positive for fatigue. Negative for chills, fever and unexpected weight change.  HENT: Negative for congestion, ear pain, postnasal drip, sinus pressure and sinus pain.   Respiratory: Positive for cough and shortness of breath. Negative for chest tightness and wheezing.   Cardiovascular: Negative for chest pain and palpitations.  Gastrointestinal: Negative for diarrhea, nausea and vomiting.  Skin: Negative.   Allergic/Immunologic: Negative for environmental allergies and food allergies.  Neurological: Positive for dizziness. Negative for light-headedness and headaches.  Psychiatric/Behavioral: Negative for  dysphoric mood. The patient is not nervous/anxious.   All other systems reviewed and are negative.    Physical Exam  BP 100/72 (BP Location: Left Arm, Cuff Size: Normal)   Pulse (!) 103   Ht 5\' 3"  (1.6 m)   Wt 88 lb 6.4 oz (40.1 kg)   SpO2 100%   BMI 15.66 kg/m   Wt Readings from Last 5 Encounters:  05/04/18 88 lb 6.4 oz (40.1 kg)  03/22/18 84 lb (38.1 kg)  12/19/17 82 lb (37.2 kg)  10/27/17 83 lb 12.8 oz (38 kg)  09/19/17 87 lb 3.2 oz (39.6 kg)   Orthostatic VS for the past 24 hrs:  BP- Lying Pulse- Lying BP- Sitting Pulse- Sitting BP- Standing at 0 minutes Pulse- Standing at 0 minutes  05/04/18 1500 116/70 96 98/60 100 118/80 100    Physical Exam  Constitutional: She is oriented to person, place, and time and well-developed, well-nourished, and in no distress. Vital signs are normal. She appears cachectic. No distress.  Thin elderly female  HENT:  Head: Normocephalic and atraumatic.  Right Ear: Hearing, tympanic membrane, external ear and ear canal normal.  Left Ear: Hearing, tympanic membrane, external ear and ear canal normal.  Nose: Mucosal edema present. Right sinus exhibits no maxillary sinus tenderness and no frontal sinus tenderness. Left sinus exhibits no maxillary sinus tenderness and no frontal sinus tenderness.  Mouth/Throat: Uvula is midline and oropharynx is clear and moist. No oropharyngeal exudate.  + Postnasal drip  Eyes: Pupils are equal, round, and reactive to light.  Neck: Normal range of motion. Neck supple. No JVD present.  Cardiovascular: Normal rate, regular rhythm and normal heart sounds.  Pulmonary/Chest: Effort normal and breath sounds normal. No accessory muscle usage. No respiratory distress. She has no decreased breath sounds. She has no wheezes. She has no rhonchi.  Abdominal: Soft. Bowel sounds are normal.  Musculoskeletal: Normal range of motion. She exhibits no edema.  Lymphadenopathy:    She has no cervical adenopathy.  Neurological:  She  is alert and oriented to person, place, and time. Gait normal.  Skin: Skin is warm and dry. She is not diaphoretic. No erythema.  Psychiatric: Mood, memory, affect and judgment normal.  Nursing note and vitals reviewed.     Lab Results:  CBC    Component Value Date/Time   WBC 9.7 03/22/2018 1418   RBC 4.32 03/22/2018 1418   HGB 15.1 (H) 03/22/2018 1418   HCT 45.1 03/22/2018 1418   PLT 262.0 03/22/2018 1418   MCV 104.4 (H) 03/22/2018 1418   MCV 99.2 (A) 04/22/2015 1547   MCH 33.5 08/16/2017 1345   MCHC 33.5 03/22/2018 1418   RDW 13.6 03/22/2018 1418   LYMPHSABS 1.8 03/22/2018 1418   MONOABS 0.8 03/22/2018 1418   EOSABS 0.2 03/22/2018 1418   BASOSABS 0.1 03/22/2018 1418    BMET    Component Value Date/Time   NA 140 03/22/2018 1418   K 4.2 03/22/2018 1418   CL 100 03/22/2018 1418   CO2 35 (H) 03/22/2018 1418   GLUCOSE 101 (H) 03/22/2018 1418   BUN 14 03/22/2018 1418   CREATININE 0.49 03/22/2018 1418   CALCIUM 10.1 03/22/2018 1418   GFRNONAA >60 08/16/2017 1345   GFRAA >60 08/16/2017 1345    BNP No results found for: BNP  ProBNP    Component Value Date/Time   PROBNP 33.0 03/22/2018 1418      Assessment & Plan:   Pleasant 62 year old female patient followed in our office today.  Will patient walk for POC she did not qualify.  Spent a significant portion of her appointment today discussing stopping smoking.  Patient will work to set quit date.  Provided information on nicotine replacement therapies have been good fits for her.  Patient reports that she will work on this.  Will defer lung cancer screening at this time is FEV1 on last spirometry was 39%.  Patient is interested in pulmonary rehab, will refer.  Can consider Stiolto Respimat inhaler in the future if it formulary this would replace her Spiriva Respimat 2.5.  Patient will check formulary with her insurance and let us know.  Patient to keep follow-up with our office on January/2020.  COPD GOLD III    Continue Spiriva Respimat 2.5 >>> 2 puffs daily >>> Do this every day >>>This is not a rescue inhaler  Check formulary on Stiolto Respimat coverage if so we can start this instead of your Spiriva  We recommend that you stop smoking.  >>>You need to set a quit date >>>If you have friends or family who smoke, let them know you are trying to quit and not to smoke around you or in your living environment  Smoking Cessation Resources:  1 800 QUIT NOW  >>> Patient to call this resource and utilize it to help support her quit smoking >>> Keep up your hard work with stopping smoking  You can also contact the [facility] >>>For smoking cessation classes call [phone]  We do not recommend using e-cigarettes as a form of stopping smoking  You can sign up for smoking cessation support texts and information:  >>>https://smokefree.gov/smokefreetxt  We will place an order for pulmonary rehab  Keep follow-up with our office in January/2020 or sooner if symptoms worsen  Tobacco abuse Will defer lung cancer screening at this time as patient's FEV1 is 39%, she continues to smoke.  We recommend that you stop smoking.  >>>You need to set a quit date >>>If you have friends or family who smoke,  let them know you are trying to quit and not to smoke around you or in your living environment  Smoking Cessation Resources:  1 800 QUIT NOW  >>> Patient to call this resource and utilize it to help support her quit smoking >>> Keep up your hard work with stopping smoking  You can also contact the [facility] >>>For smoking cessation classes call [phone]  We do not recommend using e-cigarettes as a form of stopping smoking  You can sign up for smoking cessation support texts and information:  >>>https://smokefree.gov/smokefreetxt   Nicotine patches: >>>Make sure you rotate sites that you do not get skin irritation, Apply 1 patch each morning to a non-hairy skin  site  If you are smoking greater than 10 cigarettes/day and weigh over 45 kg start with the nicotine patch of 21 mg a day for 6 weeks, then 14 mg a day for 2 weeks, then finished with 7 mg a day for 2 weeks, then stop  If you are smoking less than 10 cigarettes a day or weight less than 45 kg start with medium dose pack of 14 mg a day for 6 weeks, followed by 7 mg a day for 2 weeks   >>>If insomnia occurs you are having trouble sleeping you can take the patch off at night, and place a new one on in the morning >>>If the patch is removed at night and you have morning cravings start short acting nicotine replacement therapy such as gum or lozenges  Nicotine Gum:  >>>Smokers who smoke 25 or more cigarettes a day should use 4 mg dose >>>Smokers who smoke fewer than 25 cigarettes a day should use 2 mg dose  Proper chewing of gum is important for optimal results.   >>>Do not chew gum to rapidly.  Once you start chewing eating tasty peppery taste and slide the gum to your cheek.  When the taste disappears to a few more times.  Repeat this for 30 minutes.  Then discard the gum.  >>>Avoid acidic beverages such as coffee, carbonated beverages before and during gum use.  A soft acidic beverages lower oral pH which cause nicotine to not be absorbed properly >>>If you chew the gum too quickly or vigorously you could have nausea, vomiting, abdominal pain, constipation, hiccups, headache, sore jaw, mouth irritation ulcers  Nicotine lozenge: Lozenges are commonly uses short acting NRT product  >>>Smokers who smoke within 30 minutes of awakening should use 4 mg dose  Can use up to 1 lozenge every 1-2 hours for 6 weeks >>>Total amount of lozenges that can be used per day as 20 >>>Gradually reduce number of lozenges used per day after 2 weeks of use  Place lozenge in mouth and allowed to dissolve for 30 minutes loss and does not need to be chewed  Lozenges have advantages to be able to be used in people  with TMG, poor dentition, dentures   We will place an order for pulmonary rehab  Keep follow-up with our office in January/2020 or sooner if symptoms worsen  Chronic respiratory failure with hypoxia (HCC)/ noct hypoxemia Walked in office today with no desaturations on room air Continue 3 L of O2 at night     Lauraine Rinne, NP 05/04/2018

## 2018-05-08 ENCOUNTER — Telehealth: Payer: Self-pay | Admitting: Internal Medicine

## 2018-05-08 NOTE — Telephone Encounter (Signed)
Ok but Will need a copy of the jury duty letter to write an effective response

## 2018-05-08 NOTE — Telephone Encounter (Signed)
Called and spoke with patient, she is aware and verbalized understanding. Patient will bring the summons letter by today. Nothing further needed.

## 2018-05-08 NOTE — Telephone Encounter (Signed)
Called and spoke with patient, she stated that she received a Chief Operating Officer and she is wanting a note to get out of going. She is not able to go due to her health problems.    MW please advise, thank you.

## 2018-05-17 ENCOUNTER — Telehealth (HOSPITAL_COMMUNITY): Payer: Self-pay

## 2018-05-17 NOTE — Telephone Encounter (Signed)
Pt insurance is active and benefits verified through [place]. Co-pay $0.00, DED $1,250.00/$1,250.00 met, out of pocket $4,890.00/$4,890.00 met, co-insurance 20%. No pre-authorization required. Andy/[employer], 05/17/18 @ 11:03AM, REF# [id number]

## 2018-05-17 NOTE — Telephone Encounter (Signed)
Called patient to see if she was interested in participating in the Pulmonary Rehab Program. Patient stated yes. Patient will come in for orientation on 06/05/18 @ 1:30PM and will attend the 1:30PM exercise class.  Mailed homework package.  went over insurance, patient verbalized understanding.

## 2018-05-24 ENCOUNTER — Telehealth: Payer: Self-pay | Admitting: Pulmonary Disease

## 2018-05-24 NOTE — Telephone Encounter (Signed)
Called patient unable to reach left message

## 2018-05-25 MED ORDER — TIOTROPIUM BROMIDE-OLODATEROL 2.5-2.5 MCG/ACT IN AERS: 2.0000 | INHALATION_SPRAY | Freq: Every day | RESPIRATORY_TRACT | 1 refills | Status: DC

## 2018-05-25 NOTE — Telephone Encounter (Signed)
Spoke with patient and pre BM last note if it was formulary for her he wanted her to start this instead of the Spiriva. I have sent in a 90 day supply  with 1 refill.

## 2018-05-30 ENCOUNTER — Telehealth (HOSPITAL_COMMUNITY): Payer: Self-pay

## 2018-06-02 ENCOUNTER — Telehealth (HOSPITAL_COMMUNITY): Payer: Self-pay | Admitting: Family Medicine

## 2018-06-05 ENCOUNTER — Ambulatory Visit (HOSPITAL_COMMUNITY): Payer: BC Managed Care – PPO

## 2018-06-05 ENCOUNTER — Telehealth (HOSPITAL_COMMUNITY): Payer: Self-pay | Admitting: *Deleted

## 2018-06-05 NOTE — Telephone Encounter (Signed)
Returned pt call for message left that she would not be able to do the pulmonary undergraduate program.  She can not afford the copay.  Spoke to pt regarding our pulmonary maintenance program. Pt is interested and believes she can afford the 100.00 (pt would need to be supplied O2). Advised pt that her medical history will be reviewed for appropriateness for participating in maintenance verses undergraduate program.Chiara Coltrin Elana Alm, BSN Cardiac and Pulmonary Rehab Nurse Navigator

## 2018-06-08 ENCOUNTER — Encounter (HOSPITAL_COMMUNITY): Payer: Self-pay | Admitting: *Deleted

## 2018-06-08 NOTE — Progress Notes (Signed)
Placed a telephone call to discussed the Pulmonary Rehab Maintenance program. I did not reach her but left her a message.

## 2018-06-22 ENCOUNTER — Telehealth: Payer: Self-pay | Admitting: Internal Medicine

## 2018-06-22 DIAGNOSIS — J439 Emphysema, unspecified: Secondary | ICD-10-CM

## 2018-06-22 NOTE — Telephone Encounter (Signed)
Pulmonary rehab is closed for the day.  Will call back 06/23/2018.

## 2018-06-23 ENCOUNTER — Ambulatory Visit: Payer: BC Managed Care – PPO | Admitting: Internal Medicine

## 2018-06-23 ENCOUNTER — Encounter: Payer: Self-pay | Admitting: Internal Medicine

## 2018-06-23 DIAGNOSIS — J9611 Chronic respiratory failure with hypoxia: Secondary | ICD-10-CM | POA: Diagnosis not present

## 2018-06-23 DIAGNOSIS — F17210 Nicotine dependence, cigarettes, uncomplicated: Secondary | ICD-10-CM

## 2018-06-23 DIAGNOSIS — J9612 Chronic respiratory failure with hypercapnia: Secondary | ICD-10-CM | POA: Diagnosis not present

## 2018-06-23 DIAGNOSIS — J449 Chronic obstructive pulmonary disease, unspecified: Secondary | ICD-10-CM | POA: Diagnosis not present

## 2018-06-23 NOTE — Telephone Encounter (Signed)
Attempted to call Lattie Haw with [facility] but unable to reach her. Left message for Lattie Haw to return call.   While awaiting a return call from [facility], [name], please advise if you are okay with pt getting into the pulm maintenance program with pulm rehab? Thanks!

## 2018-06-23 NOTE — Progress Notes (Signed)
Subjective:   Patient ID: Darlene [name], female    DOB: 1956-03-04    MRN: [id number]    Brief patient profile:  89  yowf  MM active smoker dx with remote dx copd  By Clance self referred to pulmonary clinic 12/29/2012 with progressive doe x 06/2012 dx with GOLD III COPD 02/02/13      History of Present Illness  12/29/2012 1st pulmonary eval in EPIC era / Darlene [name]  cc indolent onset progressive doe x 6 month getting groceries from car to [employer],  crossing parking lots. In am lots watery nasal congestion and cough > 30 min each am, assoc with chest discomfort midline during cough, prod sev tbsp white mucus,, some better p saba uses 4-5 x day.   rec Continue symbicort 160 Take 2 puffs first thing in am and then another 2 puffs about 12 hours later. Only use your albuterol as a rescue medication  Work on inhaler technique:      09/19/2017  f/u ov/Darlene [name] re:  GOLD III copd/ has 02 but not using or monitoring her sats / resumed smoking / Chief Complaint  Patient presents with  . Follow-up    Breathing has been slightly worse "might be my nerves". She has a prod cough with white sputum.  She has been using her albuterol inhaler 3-4 x per day.   Dyspnea:  Slow walking across parking lot  s stopping = MMRC2 = can't walk a nl pace on a flat grade s sob but does fine slow and flat s 02  Cough: white mucus/ mostly in am   Sleep: ok in recliner x 45 degrees SABA use: mostly in day with cleaning/ unloading groceries   Also limited by back from exertion rec  Plan A = Automatic = symbicort 160 Take 2 puffs first thing in am and then another 2 puffs about 12 hours later.  Spiriva is used each am  = one capsule or two puffs off the green or 4 off the blue Work on inhaler technique:     Plan B = Backup Only use your albuterol (proair) as a rescue medication    03/22/2018  f/u ov/Darlene [name] re: copd  GOLD III/ still smoking  Chief Complaint  Patient presents with  . Follow-up    Breathing is unchanged.  She is using her albuterol inhaler 3-4 x per day.    Dyspnea:  [employer] pushing cart 3lpm never checks sats with 02  but down to 70s without 02  Cough: some rattling esp am Sleeping: 2.5 lpm at 45 degrees  SABA use: 3-4 x per day including first thing in am due to am congestion  rec Add pepcid 20 mg over the counter at bedtime  For cough mucinex 600 mg up to 2 every 12 hours and use the flutter valve as much you can  Only use your albuterol as a rescue medication   05/25/18 changed to stiolto    06/23/2018  f/u ov/Darlene [name] re:  GOLD III/ still smoking maint now stiolto  Chief Complaint  Patient presents with  . Follow-up    Breathing is overall doing well. She is using her albuterol inhaler once daily on average.   Dyspnea:  [employer] on 3lpm slow pace = MMRC3 = can't walk 100 yards even at a slow pace at a flat grade s stopping due to sob   Cough: am's mucoid  Production clears after the first hour Sleeping: 45 degrees  SABA use: once daily  avg when overdoes it 02:  2.5 lpm hs / during the day up to 3lpm walking    No obvious day to day or daytime variability or assoc   purulent sputum or mucus plugs or hemoptysis or cp or chest tightness, subjective wheeze or overt sinus or hb symptoms.   Sleeping as aove  without nocturnal  or early am exacerbation  of respiratory  c/o's or need for noct saba. Also denies any obvious fluctuation of symptoms with weather or environmental changes or other aggravating or alleviating factors except as outlined above   No unusual exposure hx or h/o childhood pna/ asthma or knowledge of premature birth.  Current Allergies, Complete Past Medical History, Past Surgical History, Family History, and Social History were reviewed in [employer] record.  ROS  The following are not active complaints unless bolded Hoarseness, sore throat, dysphagia, dental problems, itching, sneezing,  nasal congestion or discharge of excess mucus or  purulent secretions, ear ache,   fever, chills, sweats, unintended wt loss or wt gain, classically pleuritic or exertional cp,  orthopnea pnd or arm/hand swelling  or leg swelling, presyncope, palpitations, abdominal pain, anorexia, nausea, vomiting, diarrhea  or change in bowel habits or change in bladder habits, change in stools or change in urine, dysuria, hematuria,  rash, arthralgias, visual complaints, headache, numbness, weakness or ataxia or problems with walking or coordination,  change in mood or  memory.        Current Meds  Medication Sig  . acetaminophen (TYLENOL) 500 MG tablet Take 500-1,000 mg by mouth every 6 (six) hours as needed (for pain).   Marland Kitchen albuterol (PROVENTIL HFA;VENTOLIN HFA) 108 (90 Base) MCG/ACT inhaler TAKE 2 PUFFS BY MOUTH EVERY 6 HOURS AS NEEDED FOR WHEEZE OR SHORTNESS OF BREATH  . alendronate (FOSAMAX) 70 MG tablet Take 70 mg by mouth once a week. Take with a full glass of water on an empty stomach.  . ALPRAZolam (XANAX) 0.25 MG tablet Take 0.25 mg by mouth daily.   . Calcium Carb-Cholecalciferol (CALCIUM+D3 PO) Take 1 tablet by mouth 2 (two) times a week.  . DULoxetine (CYMBALTA) 60 MG capsule Take 60 mg by mouth daily.  . famotidine (PEPCID) 20 MG tablet One at bedtime  . mirtazapine (REMERON) 15 MG tablet Take 15 mg by mouth at bedtime.  Marland Kitchen Respiratory Therapy Supplies (FLUTTER) DEVI Use as directed  . SYMBICORT 160-4.5 MCG/ACT inhaler INHALE 2 PUFFS BY MOUTH TWICE A DAY  . Tiotropium Bromide-Olodaterol (STIOLTO RESPIMAT) 2.5-2.5 MCG/ACT AERS Inhale 2 puffs into the lungs daily.  . traMADol (ULTRAM) 50 MG tablet Take 50 mg by mouth every 6 (six) hours as needed (for pain).   . Vitamin D, Ergocalciferol, (DRISDOL) 50000 units CAPS capsule Take 50,000 Units by mouth every 14 (fourteen) days.                 Objective:   Physical Exam    Thin amb chronically ill appearing wf nad   Vital signs reviewed - Note on arrival 02 sats  93% on RA     06/23/2018        88  03/22/2018        85  12/19/2017         82 10/27/2017       83 09/19/2017         87  01/28/2017       88  08/04/16 93 lb (42.2 kg)  04/22/15 102 lb (46.3 kg)  03/11/15  104 lb (47.2 kg)      02/02/13 91 lb (41.277 kg)  12/29/12 92 lb 9.6 oz (42.003 kg)      HEENT: Poor  dentition / nl oropharynx. Nl external ear canals without cough reflex -  Mild  bilateral non-specific turbinate edema     NECK :  without JVD/Nodes/TM/ nl carotid upstrokes bilaterally   LUNGS: no acc muscle use,  Mod barrel  contour chest wall with bilateral  Distant bs s audible wheeze and  without cough on insp or exp maneuver and mod  Hyperresonant  to  percussion bilaterally     CV:  RRR  no s3 or murmur or increase in P2, and no edema   ABD:  soft and nontender with pos mid  insp Hoover's  in the supine position. No bruits or organomegaly appreciated, bowel sounds nl  MS:   Nl gait/  ext warm without deformities, calf tenderness, cyanosis or clubbing No obvious joint restrictions   SKIN: warm and dry without lesions    NEURO:  alert, approp, nl sensorium with  no motor or cerebellar deficits apparent.            Assessment & Plan:

## 2018-06-23 NOTE — Telephone Encounter (Signed)
Ok for [facility] pulmonary rehab

## 2018-06-23 NOTE — Patient Instructions (Addendum)
Work on inhaler technique:  relax and gently blow all the way out then take a nice smooth deep breath back in, triggering the inhaler at same time you start breathing in.  Hold for up to 5 seconds if you can.   Rinse and gargle with water when done   Please schedule a follow up visit in 6  months but call sooner if needed

## 2018-06-23 NOTE — Telephone Encounter (Signed)
Spoke with Thayer Headings at [facility]. Lattie Haw does not work on Mondays or Fridays but she will relay to the message to her when she returns on Tuesday. Will go ahead and place the order. Nothing further needed at time of call.

## 2018-06-24 ENCOUNTER — Encounter: Payer: Self-pay | Admitting: Internal Medicine

## 2018-06-24 NOTE — Assessment & Plan Note (Signed)
Active smoker - PFT's 02/02/2013   FEV1 1.28 (50%)   p  33% response to saba  with ratio 47 and DLCO 66 corrects to 73  - hfa 75 % 12/29/2012  > 75% 02/02/2013  -   alpha testing 02/02/2013 > MM - 08/04/2016    try bevespi > no decrease saba use - 01/28/2017    rechallenge with symb 160 2bid  - 06/22/2017      50% > try add spiriva respimat> pharmacy substitued the dpi  - Spirometry 06/22/2017  FEV1 0.97 (39%)  Ratio 46 though < 6 sec exp   - 03/22/2018 add flutter  - 05/25/18 changed to stiolto  - 06/23/2018  After extensive coaching inhaler device,  effectiveness =    75% (short Ti) on smi > continue stiolto    Pt is Group B in terms of symptom/risk and laba/lama therefore appropriate rx at this point so continue stiolto but work on technique

## 2018-06-24 NOTE — Assessment & Plan Note (Signed)

## 2018-06-24 NOTE — Assessment & Plan Note (Signed)
On 3lpm hs chronically 10/27/2017   Walked RA  2 laps @ 185 ft each stopped due to  Sob/ no desats at moderate pace  - HCO3  03/22/2018  = 35     As of 06/23/2018 rec 2.5 lpm  hs and prn daytime to keep sats > 90%

## 2018-06-27 ENCOUNTER — Ambulatory Visit (HOSPITAL_COMMUNITY): Payer: BC Managed Care – PPO

## 2018-06-29 ENCOUNTER — Ambulatory Visit (HOSPITAL_COMMUNITY): Payer: BC Managed Care – PPO

## 2018-06-29 ENCOUNTER — Encounter (HOSPITAL_COMMUNITY): Payer: Self-pay | Admitting: *Deleted

## 2018-06-29 NOTE — Progress Notes (Signed)
Darlene [name] was supposed to start the Pulmonary Maintenance Program 06/27/2018. She called in and said that she was sick and wouldn't be able to attend. She is going to call me back once she is well so that I can reschedule her.

## 2018-07-04 ENCOUNTER — Ambulatory Visit (HOSPITAL_COMMUNITY): Payer: BC Managed Care – PPO

## 2018-07-06 ENCOUNTER — Ambulatory Visit (HOSPITAL_COMMUNITY): Payer: BC Managed Care – PPO

## 2018-07-11 ENCOUNTER — Ambulatory Visit (HOSPITAL_COMMUNITY): Payer: BC Managed Care – PPO

## 2018-07-13 ENCOUNTER — Ambulatory Visit (HOSPITAL_COMMUNITY): Payer: BC Managed Care – PPO

## 2018-07-18 ENCOUNTER — Ambulatory Visit (HOSPITAL_COMMUNITY): Payer: BC Managed Care – PPO

## 2018-07-20 ENCOUNTER — Ambulatory Visit (HOSPITAL_COMMUNITY): Payer: BC Managed Care – PPO

## 2018-07-25 ENCOUNTER — Ambulatory Visit (HOSPITAL_COMMUNITY): Payer: BC Managed Care – PPO

## 2018-07-27 ENCOUNTER — Ambulatory Visit (HOSPITAL_COMMUNITY): Payer: BC Managed Care – PPO

## 2018-08-01 ENCOUNTER — Ambulatory Visit (HOSPITAL_COMMUNITY): Payer: BC Managed Care – PPO

## 2018-08-03 ENCOUNTER — Ambulatory Visit (HOSPITAL_COMMUNITY): Payer: BC Managed Care – PPO

## 2018-08-08 ENCOUNTER — Ambulatory Visit (HOSPITAL_COMMUNITY): Payer: BC Managed Care – PPO

## 2018-08-10 ENCOUNTER — Ambulatory Visit (HOSPITAL_COMMUNITY): Payer: BC Managed Care – PPO

## 2018-08-15 ENCOUNTER — Ambulatory Visit (HOSPITAL_COMMUNITY): Payer: BC Managed Care – PPO

## 2018-08-17 ENCOUNTER — Encounter (HOSPITAL_COMMUNITY): Payer: Self-pay | Admitting: *Deleted

## 2018-08-17 ENCOUNTER — Ambulatory Visit (HOSPITAL_COMMUNITY): Payer: BC Managed Care – PPO

## 2018-08-17 NOTE — Progress Notes (Signed)
I had called Darlene [name] to reschedule her  Pulmonary Maintenance and left her a message. I got no return call from her. Today I was able to reach her and she states that she has decided that she can not afford it. I encouraged her to call if I can be of any assistants to her in the future.

## 2018-08-22 ENCOUNTER — Ambulatory Visit (HOSPITAL_COMMUNITY): Payer: BC Managed Care – PPO

## 2018-08-24 ENCOUNTER — Ambulatory Visit (HOSPITAL_COMMUNITY): Payer: BC Managed Care – PPO

## 2018-08-29 ENCOUNTER — Ambulatory Visit (HOSPITAL_COMMUNITY): Payer: BC Managed Care – PPO

## 2018-08-31 ENCOUNTER — Ambulatory Visit (HOSPITAL_COMMUNITY): Payer: BC Managed Care – PPO

## 2018-09-05 ENCOUNTER — Ambulatory Visit (HOSPITAL_COMMUNITY): Payer: BC Managed Care – PPO

## 2018-09-07 ENCOUNTER — Ambulatory Visit (HOSPITAL_COMMUNITY): Payer: BC Managed Care – PPO

## 2018-09-12 ENCOUNTER — Ambulatory Visit (HOSPITAL_COMMUNITY): Payer: BC Managed Care – PPO

## 2018-09-14 ENCOUNTER — Ambulatory Visit (HOSPITAL_COMMUNITY): Payer: BC Managed Care – PPO

## 2018-09-19 ENCOUNTER — Ambulatory Visit (HOSPITAL_COMMUNITY): Payer: BC Managed Care – PPO

## 2018-09-19 ENCOUNTER — Telehealth: Payer: Self-pay | Admitting: Internal Medicine

## 2018-09-19 NOTE — Telephone Encounter (Signed)
Once I have the paperwork in my hand I will arrange a televisit w/in 24 h to go over her present symptoms as these usually require an updated data and haven't seen her in over 3 months so can't do it s "ov" which we can do over the phone now  Unless/until  COVID - 19 restrictions have been lifted.

## 2018-09-19 NOTE — Telephone Encounter (Signed)
Received callback from patient. Informed that [name] has replied to message and requests she begin the process for her long term disability.  [name] agreed to proceed and once we receive the paperwork we will contact her to set up Televisit with [name] to complete it.  Patient acknowledged understanding and plans to take paperwork to [place] this week.  Nothing further needed at this time.

## 2018-09-19 NOTE — Telephone Encounter (Signed)
LM to call back re: Dr. Gustavus Bryant reply on disability.

## 2018-09-19 NOTE — Telephone Encounter (Signed)
Pt want's to know from MW if she should go on Long Term Disability.  Right know pt is on Short Term Disability and she said that it running out. She want's to know what to do.

## 2018-09-19 NOTE — Telephone Encounter (Signed)
Contacted patient re: question regarding disability from work.  Is currently out on short term disability but this is coming to an end and patient feels  unable to return to work duties due to breathing, fumes at work and chronic back pain.  She needs to make a decision to proceed with long term disability which means she would resign from her job or if not, she must return to work.  She would like Dr. Gustavus Bryant opinion on this and ask if he would be willing to sign long term disability.  Patient is aware that she would need to route this claim through [facility].  Patient informed we would send to [name] for review then follow up with her regarding disability.  Will route to [name] for review.

## 2018-09-21 ENCOUNTER — Ambulatory Visit (HOSPITAL_COMMUNITY): Payer: BC Managed Care – PPO

## 2018-09-26 ENCOUNTER — Ambulatory Visit (HOSPITAL_COMMUNITY): Payer: BC Managed Care – PPO

## 2018-09-27 ENCOUNTER — Telehealth: Payer: Self-pay | Admitting: Internal Medicine

## 2018-09-27 NOTE — Telephone Encounter (Signed)
Forms in Dr Gustavus Bryant lookat

## 2018-09-28 ENCOUNTER — Ambulatory Visit (HOSPITAL_COMMUNITY): Payer: BC Managed Care – PPO

## 2018-09-28 NOTE — Telephone Encounter (Signed)
Rec'd completed paperwork from [place]. Fwd to [place] via interoffice mail -pr

## 2018-10-03 ENCOUNTER — Other Ambulatory Visit: Payer: Self-pay | Admitting: Internal Medicine

## 2018-10-03 ENCOUNTER — Ambulatory Visit (HOSPITAL_COMMUNITY): Payer: BC Managed Care – PPO

## 2018-10-05 ENCOUNTER — Ambulatory Visit (HOSPITAL_COMMUNITY): Payer: BC Managed Care – PPO

## 2018-10-25 ENCOUNTER — Ambulatory Visit: Payer: BC Managed Care – PPO | Admitting: Internal Medicine

## 2018-11-11 ENCOUNTER — Other Ambulatory Visit: Payer: Self-pay

## 2018-11-11 ENCOUNTER — Encounter (HOSPITAL_COMMUNITY): Payer: Self-pay

## 2018-11-11 ENCOUNTER — Emergency Department (HOSPITAL_COMMUNITY)
Admission: EM | Admit: 2018-11-11 | Discharge: 2018-11-11 | Disposition: A | Discharge status: Home / Self Care | Payer: BC Managed Care – PPO | Attending: Emergency Medicine | Admitting: Emergency Medicine

## 2018-11-11 ENCOUNTER — Emergency Department (HOSPITAL_COMMUNITY): Payer: BC Managed Care – PPO

## 2018-11-11 DIAGNOSIS — Y9289 Other specified places as the place of occurrence of the external cause: Secondary | ICD-10-CM | POA: Insufficient documentation

## 2018-11-11 DIAGNOSIS — W19XXXA Unspecified fall, initial encounter: Secondary | ICD-10-CM | POA: Insufficient documentation

## 2018-11-11 DIAGNOSIS — Y998 Other external cause status: Secondary | ICD-10-CM | POA: Insufficient documentation

## 2018-11-11 DIAGNOSIS — Y9301 Activity, walking, marching and hiking: Secondary | ICD-10-CM | POA: Insufficient documentation

## 2018-11-11 DIAGNOSIS — Z1159 Encounter for screening for other viral diseases: Secondary | ICD-10-CM | POA: Insufficient documentation

## 2018-11-11 DIAGNOSIS — S7002XA Contusion of left hip, initial encounter: Secondary | ICD-10-CM | POA: Diagnosis not present

## 2018-11-11 DIAGNOSIS — Z79899 Other long term (current) drug therapy: Secondary | ICD-10-CM | POA: Diagnosis not present

## 2018-11-11 DIAGNOSIS — F17210 Nicotine dependence, cigarettes, uncomplicated: Secondary | ICD-10-CM | POA: Insufficient documentation

## 2018-11-11 DIAGNOSIS — J449 Chronic obstructive pulmonary disease, unspecified: Secondary | ICD-10-CM | POA: Insufficient documentation

## 2018-11-11 DIAGNOSIS — R000 Tachycardia, unspecified: Secondary | ICD-10-CM | POA: Diagnosis not present

## 2018-11-11 DIAGNOSIS — S79912A Unspecified injury of left hip, initial encounter: Secondary | ICD-10-CM | POA: Diagnosis present

## 2018-11-11 DIAGNOSIS — R52 Pain, unspecified: Secondary | ICD-10-CM

## 2018-11-11 LAB — URINALYSIS, ROUTINE W REFLEX MICROSCOPIC
Bilirubin Urine: NEGATIVE
Glucose, UA: NEGATIVE mg/dL
Hgb urine dipstick: NEGATIVE
Ketones, ur: 20 mg/dL — AB
Leukocytes,Ua: NEGATIVE
Nitrite: NEGATIVE
Protein, ur: NEGATIVE mg/dL
Specific Gravity, Urine: 1.013 (ref 1.005–1.030)
pH: 5 (ref 5.0–8.0)

## 2018-11-11 LAB — BASIC METABOLIC PANEL
Anion gap: 14 (ref 5–15)
BUN: 9 mg/dL (ref 8–23)
CO2: 23 mmol/L (ref 22–32)
Calcium: 8.8 mg/dL — ABNORMAL LOW (ref 8.9–10.3)
Chloride: 100 mmol/L (ref 98–111)
Creatinine, Ser: 0.42 mg/dL — ABNORMAL LOW (ref 0.44–1.00)
GFR calc Af Amer: >60 mL/min (ref >60)
GFR calc non Af Amer: >60 mL/min (ref >60)
Glucose, Bld: 89 mg/dL (ref 70–99)
Potassium: 4 mmol/L (ref 3.5–5.1)
Sodium: 137 mmol/L (ref 135–145)

## 2018-11-11 LAB — CBC
HCT: 44.6 % (ref 36.0–46.0)
Hemoglobin: 14.3 g/dL (ref 12.0–15.0)
MCH: 34.5 pg — ABNORMAL HIGH (ref 26.0–34.0)
MCHC: 32.1 g/dL (ref 30.0–36.0)
MCV: 107.7 fL — ABNORMAL HIGH (ref 80.0–100.0)
Platelets: 211 10*3/uL (ref 150–400)
RBC: 4.14 MIL/uL (ref 3.87–5.11)
RDW: 12.6 % (ref 11.5–15.5)
WBC: 10.7 10*3/uL — ABNORMAL HIGH (ref 4.0–10.5)
nRBC: 0 % (ref 0.0–0.2)

## 2018-11-11 LAB — SARS CORONAVIRUS 2 BY RT PCR (HOSPITAL ORDER, PERFORMED IN ~~LOC~~ HOSPITAL LAB): SARS Coronavirus 2: NEGATIVE

## 2018-11-11 LAB — TROPONIN I: Troponin I: <0.03 ng/mL (ref <0.03)

## 2018-11-11 MED ORDER — SODIUM CHLORIDE 0.9 % IV BOLUS (SEPSIS)
500.0000 mL | Freq: Once | INTRAVENOUS | Status: AC
  Administered 2018-11-11: 20:00:00 500 mL via INTRAVENOUS

## 2018-11-11 MED ORDER — MORPHINE SULFATE (PF) 4 MG/ML IV SOLN
4.0000 mg | Freq: Once | INTRAVENOUS | Status: AC
  Administered 2018-11-11: 20:00:00 4 mg via INTRAVENOUS
  Filled 2018-11-11: qty 1

## 2018-11-11 MED ORDER — SODIUM CHLORIDE 0.9 % IV SOLN
1000.0000 mL | INTRAVENOUS | Status: DC
  Administered 2018-11-11: 1000 mL via INTRAVENOUS

## 2018-11-11 NOTE — ED Provider Notes (Signed)
[facility] DEPT Provider Note   CSN: [id number] Arrival date & time: 11/11/18  1830    History   Chief Complaint Chief Complaint  Patient presents with  . Fall    HPI Darlene [name] is a 63 y.o. female.     HPI Patient presents to the emergency room for evaluation of hip pain.  Patient states last evening she was walking by her door when she started to feel lightheaded and she fell.  Patient landed on her left hip.  Patient is not actually sure why she fell.  Patient was able to go to bed but this morning she had severe pain in her left hip.  Anytime she tries to move or walk she has severe pain.  Patient has not been able to get around her house since then.  She denies any trouble with fevers or chills.  She does have a chronic cough but that is unchanged.  She does intermittently have chest pain but it is not a new issue for her either.  She denies any head injury.  She denies any vomiting or diarrhea.  She denies any fevers or chills. Past Medical History:  Diagnosis Date  . Anxiety   . COPD (chronic obstructive pulmonary disease) ([place])   . Emphysema   . Substance abuse ([place])    tobacco dependance    Patient Active Problem List   Diagnosis Date Noted  . Chronic respiratory failure with hypoxia and hypercapnia ([place]) 03/23/2018  . DOE (dyspnea on exertion) 03/22/2018  . Chest pain, musculoskeletal 12/19/2017  . Chronic respiratory failure with hypoxia ([facility])/ noct hypoxemia 10/30/2017  . Recurrent spontaneous pneumothorax 07/18/2017  . Nodule of apex of right lung 06/18/2017  . Spontaneous pneumothorax on L 06/17/17 06/17/2017  . Acute respiratory failure with hypoxia ([place]) 06/17/2017  . Anxiety disorder 06/17/2017  . Tobacco abuse 06/17/2017  . Severe protein-calorie malnutrition ([place]) 06/17/2017  . 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 Osteoporosis 06/17/2017  . COPD with emphysema ([place]) 06/17/2017  . Anxiety   . COPD  (chronic obstructive pulmonary disease) ([place])   . Substance abuse ([place])   . Pulmonary nodules 07/10/2014  . Cough 03/17/2013  . Cigarette smoker 12/30/2012  . COPD GOLD III  12/29/2012    Past Surgical History:  Procedure Laterality Date  . BACK SURGERY    . KNEE SURGERY       OB History   No obstetric history on file.      Home Medications    Prior to Admission medications   Medication Sig Start Date End Date Taking? Authorizing Provider  acetaminophen (TYLENOL) 500 MG tablet Take 500-1,000 mg by mouth every 6 (six) hours as needed (for pain).    Yes [provider]  albuterol (PROVENTIL HFA;VENTOLIN HFA) 108 (90 Base) MCG/ACT inhaler TAKE 2 PUFFS BY MOUTH EVERY 6 HOURS AS NEEDED FOR WHEEZE OR SHORTNESS OF BREATH Patient taking differently: Inhale 2 puffs into the lungs every 6 (six) hours as needed for wheezing or shortness of breath.  10/10/17  Yes Tanda Rockers, MD  ALPRAZolam Duanne Moron) 0.25 MG tablet Take 0.25 mg by mouth daily.    Yes [provider]  Calcium Carb-Cholecalciferol (CALCIUM+D3 PO) Take 1 tablet by mouth every 14 (fourteen) days.    Yes [provider]  DULoxetine (CYMBALTA) 30 MG capsule Take 30 mg by mouth daily.  09/04/18  Yes [provider]  famotidine (PEPCID) 20 MG tablet One at bedtime Patient  taking differently: Take 20 mg by mouth daily as needed for heartburn.  03/22/18  Yes Tanda Rockers, MD  SYMBICORT 160-4.5 MCG/ACT inhaler TAKE 2 PUFFS BY MOUTH TWICE A DAY Patient taking differently: Inhale 2 puffs into the lungs 2 (two) times a day.  10/03/18  Yes Tanda Rockers, MD  Tiotropium Bromide-Olodaterol (STIOLTO RESPIMAT) 2.5-2.5 MCG/ACT AERS Inhale 2 puffs into the lungs daily. 05/25/18  Yes Tanda Rockers, MD  Vitamin D, Ergocalciferol, (DRISDOL) 50000 units CAPS capsule Take 50,000 Units by mouth every 14 (fourteen) days.    Yes [provider]  Respiratory Therapy Supplies (FLUTTER) DEVI Use as directed  03/22/18   Tanda Rockers, MD  traMADol (ULTRAM) 50 MG tablet Take 50 mg by mouth every 6 (six) hours as needed (for pain).  05/15/17   [provider]    Family History Family History  Problem Relation Age of Onset  . Alzheimer's disease Mother   . Cancer Paternal Aunt        colon    Social History Social History   Tobacco Use  . Smoking status: Current Every Day Smoker    Packs/day: 0.50    Years: 42.00    Pack years: 21.00    Types: Cigarettes  . Smokeless tobacco: Never Used  . Tobacco comment: quit Jan 2019 for 15 days and has started back up  Substance Use Topics  . Alcohol use: Yes    Alcohol/week: 5.0 standard drinks    Types: 5 Cans of beer per week    Comment: 3-5 cans three to four timesa week  . Drug use: No     Allergies   Patient has no known allergies.   Review of Systems Review of Systems  All other systems reviewed and are negative.    Physical Exam Updated Vital Signs BP (!) 166/95 (BP Location: Left Arm)   Pulse (!) 119   Temp 98.3 F (36.8 C) (Oral)   Resp 19   Ht 1.626 m (5\' 4" )   Wt 38.6 kg   SpO2 93%   BMI 14.59 kg/m   Physical Exam Vitals signs and nursing note reviewed.  Constitutional:      General: She is not in acute distress.    Appearance: She is well-developed.     Comments: Thin, underweight  HENT:     Head: Normocephalic and atraumatic.     Right Ear: External ear normal.     Left Ear: External ear normal.  Eyes:     General: No scleral icterus.       Right eye: No discharge.        Left eye: No discharge.     Conjunctiva/sclera: Conjunctivae normal.  Neck:     Musculoskeletal: Neck supple.     Trachea: No tracheal deviation.  Cardiovascular:     Rate and Rhythm: Regular rhythm. Tachycardia present.  Pulmonary:     Effort: Pulmonary effort is normal. No respiratory distress.     Breath sounds: Normal breath sounds. No stridor. No wheezing or rales.  Abdominal:     General: Bowel sounds are normal.  There is no distension.     Palpations: Abdomen is soft.     Tenderness: There is no abdominal tenderness. There is no guarding or rebound.  Musculoskeletal:        General: Tenderness present. No swelling or deformity.     Comments: Muscular atrophy or bruising noted on both knees, tenderness palpation left hip, pain with range  of motion  Skin:    General: Skin is warm and dry.     Findings: No rash.  Neurological:     Mental Status: She is alert.     Cranial Nerves: No cranial nerve deficit (no facial droop, extraocular movements intact, no slurred speech).     Sensory: No sensory deficit.     Motor: No abnormal muscle tone or seizure activity.     Coordination: Coordination normal.      ED Treatments / Results  Labs (all labs ordered are listed, but only abnormal results are displayed) Labs Reviewed  CBC - Abnormal; Notable for the following components:      Result Value   WBC 10.7 (*)    MCV 107.7 (*)    MCH 34.5 (*)    All other components within normal limits  BASIC METABOLIC PANEL - Abnormal; Notable for the following components:   Creatinine, Ser 0.42 (*)    Calcium 8.8 (*)    All other components within normal limits  URINALYSIS, ROUTINE W REFLEX MICROSCOPIC - Abnormal; Notable for the following components:   Ketones, ur 20 (*)    All other components within normal limits  SARS CORONAVIRUS 2 (HOSPITAL ORDER, [facility])  TROPONIN I    EKG EKG Interpretation  Date/Time:  Saturday Nov 11 2018 19:25:28 EDT Ventricular Rate:  124 PR Interval:    QRS Duration: 81 QT Interval:  331 QTC Calculation: 476 R Axis:   80 Text Interpretation:  Sinus tachycardia Ventricular premature complex RSR' in V1 or V2, probably normal variant No significant change since last tracing sinus tachycardia noted on prior ECG Confirmed by Dorie Rank [phone]) on 11/11/2018 7:28:16 PM   Radiology Dg Chest Portable 1 View  Result Date: 11/11/2018 CLINICAL DATA:   Fall yesterday EXAM: PORTABLE CHEST 1 VIEW COMPARISON:  Multiple chest x-rays and CT scans since March of 2019. FINDINGS: The nodule in the left mid lung is stable since March of 2019, better assessed on previous CT imaging. Scarring in the right apex. The heart, hila, mediastinum, lungs, and pleura are otherwise unchanged. IMPRESSION: 1. Stable left mid lung nodule. 2. No other acute abnormalities identified. Electronically Signed   By: Dorise Bullion III M.D   On: 11/11/2018 19:32   Dg Hip Unilat With Pelvis 2-3 Views Left  Result Date: 11/11/2018 CLINICAL DATA:  Left hip pain after fall yesterday. EXAM: DG HIP (WITH OR WITHOUT PELVIS) 2-3V LEFT COMPARISON:  None. FINDINGS: There is no evidence of hip fracture or dislocation. There is no evidence of arthropathy or other focal bone abnormality. IMPRESSION: Negative. Electronically Signed   By: Dorise Bullion III M.D   On: 11/11/2018 19:30    Procedures Procedures (including critical care time)  Medications Ordered in ED Medications  sodium chloride 0.9 % bolus 500 mL (0 mLs Intravenous Stopped 11/11/18 2027)    Followed by  0.9 %  sodium chloride infusion (1,000 mLs Intravenous New Bag/Given 11/11/18 2028)  morphine 4 MG/ML injection 4 mg (4 mg Intravenous Given 11/11/18 1950)     Initial Impression / Assessment and Plan / ED Course  I have reviewed the triage vital signs and the nursing notes.  Pertinent labs & imaging results that were available during my care of the patient were reviewed by me and considered in my medical decision making (see chart for details).  Clinical Course as of Nov 10 2240  Sat Nov 11, 2018  2240 Previous vital signs reviewed  from 2019 to current  Heart rate has persistently been 110s 120s   [JK]    Clinical Course User Index [JK] Dorie Rank, MD     Patient presented to the emergency room for evaluation of hip pain following a fall.  Patient's stated that she became lightheaded and fell.  ED work-up was  noted for a sinus tachycardia however when I reviewed her old vital signs patient has a persistent sinus tachycardia.  She also confirms this is chronic for her.  She is not having any acute shortness of breath.  No chest pain.  I doubt pulmonary embolism or occult coronary syndrome.  No evidence of anemia.  No significant dehydration.  Negative for covid.  X-rays do not show any fracture and after a dose of pain medications patient is able to ambulate around the ED.  I doubt occult fracture.  I suspect her lightheadedness and weakness may be related to her chronic pulmonary condition and her persistent tachycardia.  At this time no evidence of acute infection or acute cardiopulmonary event.  Patient is feeling better and is stable for discharge.  Warning signs and precautions discussed.  Final Clinical Impressions(s) / ED Diagnoses   Final diagnoses:  Contusion of left hip, initial encounter  Sinus tachycardia    ED Discharge Orders    None       Dorie Rank, MD 11/11/18 2243

## 2018-11-11 NOTE — ED Notes (Signed)
ED Provider at bedside. 

## 2018-11-11 NOTE — ED Notes (Signed)
Pt was able to ambulate without assistance, but felt a little stiff due to laying in bed for a while.

## 2018-11-11 NOTE — Discharge Instructions (Addendum)
Take over-the-counter medications as needed for your pain, follow-up with your doctor next week to be rechecked.  Return as needed for worsening symptoms

## 2018-11-11 NOTE — ED Triage Notes (Signed)
Patient arrived via [facility] from home. Patient is AOx4 and ambulatory at baseline however not currently. Patient fell left hip 24 hours ago. Patient had dizzy spell from vertigo. Patient stated she was  Unable to get out of chair today and that is why patient called 911. No shortening, deformity or rotation noted. Equal and strong pedal pulses noted.

## 2018-11-29 ENCOUNTER — Other Ambulatory Visit: Payer: Self-pay | Admitting: Family Medicine

## 2018-11-29 DIAGNOSIS — M810 Age-related osteoporosis without current pathological fracture: Secondary | ICD-10-CM

## 2018-12-04 ENCOUNTER — Other Ambulatory Visit: Payer: Self-pay | Admitting: Internal Medicine

## 2018-12-11 ENCOUNTER — Other Ambulatory Visit: Payer: BC Managed Care – PPO

## 2018-12-13 ENCOUNTER — Encounter: Payer: Self-pay | Admitting: Internal Medicine

## 2018-12-13 ENCOUNTER — Other Ambulatory Visit: Payer: Self-pay

## 2018-12-13 ENCOUNTER — Ambulatory Visit: Payer: BC Managed Care – PPO | Admitting: Internal Medicine

## 2018-12-13 DIAGNOSIS — F17210 Nicotine dependence, cigarettes, uncomplicated: Secondary | ICD-10-CM

## 2018-12-13 DIAGNOSIS — J9612 Chronic respiratory failure with hypercapnia: Secondary | ICD-10-CM

## 2018-12-13 DIAGNOSIS — J9611 Chronic respiratory failure with hypoxia: Secondary | ICD-10-CM | POA: Diagnosis not present

## 2018-12-13 DIAGNOSIS — J449 Chronic obstructive pulmonary disease, unspecified: Secondary | ICD-10-CM

## 2018-12-13 MED ORDER — BUDESONIDE-FORMOTEROL FUMARATE 160-4.5 MCG/ACT IN AERO: 2.0000 | INHALATION_SPRAY | Freq: Two times a day (BID) | RESPIRATORY_TRACT | Status: DC

## 2018-12-13 MED ORDER — SPIRIVA RESPIMAT 2.5 MCG/ACT IN AERS: INHALATION_SPRAY | RESPIRATORY_TRACT | 11 refills | Status: AC

## 2018-12-13 MED ORDER — ALBUTEROL SULFATE HFA 108 (90 BASE) MCG/ACT IN AERS: 2.0000 | INHALATION_SPRAY | RESPIRATORY_TRACT | 1 refills | Status: AC | PRN

## 2018-12-13 NOTE — Progress Notes (Signed)
Subjective:   Patient ID: Darlene [name], female    DOB: 1956-03-04    MRN: [id number]    Brief patient profile:  89  yowf  MM active smoker dx with remote dx copd  By Clance self referred to pulmonary clinic 12/29/2012 with progressive doe x 06/2012 dx with GOLD III COPD 02/02/13      History of Present Illness  12/29/2012 1st pulmonary eval in EPIC era / Darlene [name]  cc indolent onset progressive doe x 6 month getting groceries from car to [employer],  crossing parking lots. In am lots watery nasal congestion and cough > 30 min each am, assoc with chest discomfort midline during cough, prod sev tbsp white mucus,, some better p saba uses 4-5 x day.   rec Continue symbicort 160 Take 2 puffs first thing in am and then another 2 puffs about 12 hours later. Only use your albuterol as a rescue medication  Work on inhaler technique:      09/19/2017  f/u ov/Darlene [name] re:  GOLD III copd/ has 02 but not using or monitoring her sats / resumed smoking / Chief Complaint  Patient presents with  . Follow-up    Breathing has been slightly worse "might be my nerves". She has a prod cough with white sputum.  She has been using her albuterol inhaler 3-4 x per day.   Dyspnea:  Slow walking across parking lot  s stopping = MMRC2 = can't walk a nl pace on a flat grade s sob but does fine slow and flat s 02  Cough: white mucus/ mostly in am   Sleep: ok in recliner x 45 degrees SABA use: mostly in day with cleaning/ unloading groceries   Also limited by back from exertion rec  Plan A = Automatic = symbicort 160 Take 2 puffs first thing in am and then another 2 puffs about 12 hours later.  Spiriva is used each am  = one capsule or two puffs off the green or 4 off the blue Work on inhaler technique:     Plan B = Backup Only use your albuterol (proair) as a rescue medication    03/22/2018  f/u ov/Darlene [name] re: copd  GOLD III/ still smoking  Chief Complaint  Patient presents with  . Follow-up    Breathing is unchanged.  She is using her albuterol inhaler 3-4 x per day.    Dyspnea:  [employer] pushing cart 3lpm never checks sats with 02  but down to 70s without 02  Cough: some rattling esp am Sleeping: 2.5 lpm at 45 degrees  SABA use: 3-4 x per day including first thing in am due to am congestion  rec Add pepcid 20 mg over the counter at bedtime  For cough mucinex 600 mg up to 2 every 12 hours and use the flutter valve as much you can  Only use your albuterol as a rescue medication   05/25/18 changed to stiolto    06/23/2018  f/u ov/Darlene [name] re:  GOLD III/ still smoking maint now stiolto  Chief Complaint  Patient presents with  . Follow-up    Breathing is overall doing well. She is using her albuterol inhaler once daily on average.   Dyspnea:  [employer] on 3lpm slow pace = MMRC3 = can't walk 100 yards even at a slow pace at a flat grade s stopping due to sob   Cough: am's mucoid  Production clears after the first hour Sleeping: 45 degrees  SABA use: once daily  avg when overdoes it 02:  2.5 lpm hs / during the day up to 3lpm walking  rec Add pepcid 20 mg over the counter at bedtime  For cough mucinex 600 mg up to 2 every 12 hours and use the flutter valve as much you can  Only use your albuterol as a rescue medication   12/13/2018  f/u ov/Darlene [name] re: GOLD III/ stil smoking/ maint stiolto - feels symb was better  Chief Complaint  Patient presents with  . COPD    Does not feel like her breathing has improved even with the medications being taken.   Dyspnea:  Can't do [employer] due to back more so than sob Cough: min mucooid  Sleeping: ok falt  SABA use: twice weekly  02: 3lpm hs and 2-3 lpm daytime as med    No obvious day to day or daytime variability or assoc excess/ purulent sputum or mucus plugs or hemoptysis or cp or chest tightness, subjective wheeze or overt sinus or hb symptoms.   Sleeping  without nocturnal  or early am exacerbation  of respiratory  c/o's or need for noct saba. Also denies any  obvious fluctuation of symptoms with weather or environmental changes or other aggravating or alleviating factors except as outlined above   No unusual exposure hx or h/o childhood pna/ asthma or knowledge of premature birth.  Current Allergies, Complete Past Medical History, Past Surgical History, Family History, and Social History were reviewed in [employer] record.  ROS  The following are not active complaints unless bolded Hoarseness, sore throat, dysphagia, dental problems, itching, sneezing,  nasal congestion or discharge of excess mucus or purulent secretions, ear ache,   fever, chills, sweats, unintended wt loss or wt gain, classically pleuritic or exertional cp,  orthopnea pnd or arm/hand swelling  or leg swelling, presyncope, palpitations, abdominal pain, anorexia, nausea, vomiting, diarrhea  or change in bowel habits or change in bladder habits, change in stools or change in urine, dysuria, hematuria,  rash, arthralgias, visual complaints, headache, numbness, weakness or ataxia or problems with walking or coordination,  change in mood or  memory.        Current Meds  Medication Sig  . acetaminophen (TYLENOL) 500 MG tablet Take 500-1,000 mg by mouth every 6 (six) hours as needed (for pain).   Marland Kitchen albuterol (PROVENTIL HFA;VENTOLIN HFA) 108 (90 Base) MCG/ACT inhaler TAKE 2 PUFFS BY MOUTH EVERY 6 HOURS AS NEEDED FOR WHEEZE OR SHORTNESS OF BREATH (Patient taking differently: Inhale 2 puffs into the lungs every 6 (six) hours as needed for wheezing or shortness of breath. )  . ALPRAZolam (XANAX) 0.25 MG tablet Take 0.25 mg by mouth daily.   . Calcium Carb-Cholecalciferol (CALCIUM+D3 PO) Take 1 tablet by mouth every 14 (fourteen) days.   . DULoxetine (CYMBALTA) 30 MG capsule Take 30 mg by mouth daily.   . famotidine (PEPCID) 20 MG tablet One at bedtime (Patient taking differently: Take 20 mg by mouth daily as needed for heartburn. )  . ibuprofen (ADVIL) 800 MG tablet Take  800 mg by mouth every 6 (six) hours as needed.  Marland Kitchen Respiratory Therapy Supplies (FLUTTER) DEVI Use as directed  . STIOLTO RESPIMAT 2.5-2.5 MCG/ACT AERS INHALE 2 PUFFS BY MOUTH INTO THE LUNGS DAILY  . SYMBICORT 160-4.5 MCG/ACT inhaler TAKE 2 PUFFS BY MOUTH TWICE A DAY (Patient taking differently: Inhale 2 puffs into the lungs 2 (two) times a day. )  . Vitamin D, Ergocalciferol, (DRISDOL) 50000 units CAPS capsule  Take 50,000 Units by mouth every 14 (fourteen) days.                      Objective:   Physical Exam     thin amb wf nad  Vital signs reviewed - Note on arrival 02 sats  93% on RA  And bp 150/96   12/13/2018         84  06/23/2018       88  03/22/2018        85  12/19/2017         82 10/27/2017       83 09/19/2017         87  01/28/2017       88  08/04/16 93 lb (42.2 kg)  04/22/15 102 lb (46.3 kg)  03/11/15 104 lb (47.2 kg)      02/02/13 91 lb (41.277 kg)  12/29/12 92 lb 9.6 oz (42.003 kg)        HEENT: nl  oropharynx with  Poor  dentition. Nl external ear canals without cough reflex -  Mild bilateral non-specific turbinate edema     NECK :  without JVD/Nodes/TM/ nl carotid upstrokes bilaterally   LUNGS: no acc muscle use,  Mod barrel  contour chest wall with bilateral  Distant bs s audible wheeze and  without cough on insp or exp maneuver and mod  Hyperresonant  to  percussion bilaterally     CV:  RRR  no s3 or murmur or increase in P2, and no edema   ABD:  soft and nontender with pos mid insp Hoover's  in the supine position. No bruits or organomegaly appreciated, bowel sounds nl  MS:   Nl gait/  ext warm without deformities, calf tenderness, cyanosis or clubbing No obvious joint restrictions   SKIN: warm and dry without lesions    NEURO:  alert, approp, nl sensorium with  no motor or cerebellar deficits apparent     I personally reviewed images and agree with radiology impression as follows:  pCXR:  11/11/18 1. Stable left mid lung nodule. 2. No other  acute abnormalities identified.        Assessment & Plan:

## 2018-12-13 NOTE — Patient Instructions (Addendum)
02 :  3lpm at bedime and adjust your 02 to keep it over 90% - call if can't keep it over 90% while walking   Stop stiolto    Plan A = Automatic =  Symicort 160 x 2 puffs each am followed by 2 pffs of spiriva then then 12 hours later just 2 pffs symbicort   Plan B = Backup Only use your albuterol(Ventolin)  inhaler as a rescue medication to be used if you can't catch your breath by resting or doing a relaxed purse lip breathing pattern.  - The less you use it, the better it will work when you need it. - Ok to use the inhaler up to 2 puffs  every 4 hours if you must but call for appointment if use goes up over your usual need - Don't leave home without it !!  (think of it like the spare tire for your car)     Please schedule a follow up visit in 3 months but call sooner if needed  with all medications /inhalers/ solutions in hand so we can verify exactly what you are taking. This includes all medications from all doctors and over the counters

## 2018-12-18 ENCOUNTER — Encounter: Payer: Self-pay | Admitting: Internal Medicine

## 2018-12-18 NOTE — Assessment & Plan Note (Signed)
Active smoker - PFT's 02/02/2013   FEV1 1.28 (50%)   p  33% response to saba  with ratio 47 and DLCO 66 corrects to 73  - hfa 75 % 12/29/2012  > 75% 02/02/2013  -   alpha testing 02/02/2013 > MM - 08/04/2016    try bevespi > no decrease saba use - 01/28/2017    rechallenge with symb 160 2bid  - 06/22/2017      50% > try add spiriva respimat> pharmacy substitued the dpi  - Spirometry 06/22/2017  FEV1 0.97 (39%)  Ratio 46 though < 6 sec exp   - 03/22/2018 add flutter  - 05/25/18 changed to stiolto - 12/13/2018  After extensive coaching inhaler device,  effectiveness =    75% (short ti) change back to symb/spiriva which pt feels worked better    May well be that smoking is causing enough inflammation where she did benefit somewhat from ICS and really  Group D in terms of symptom/risk and laba/lama/ICS  therefore appropriate rx at this point >>>  Resume symb/ spiriva and work on smoking cessation

## 2018-12-18 NOTE — Assessment & Plan Note (Signed)

## 2018-12-18 NOTE — Assessment & Plan Note (Signed)
On 3lpm hs chronically 10/27/2017   Walked RA  2 laps @ 185 ft each stopped due to  Sob/ no desats at moderate pace  - HCO3  03/22/2018  = 35   More limited by back than sob o/w would likely need 02 with exertion bu tfor now rx same = 3lpm hs and prn daytime to keep sats > 90%

## 2018-12-22 ENCOUNTER — Ambulatory Visit: Payer: BC Managed Care – PPO | Admitting: Internal Medicine

## 2019-01-08 IMAGING — CR DG THORACIC SPINE 3V
3 series · 3 of 3 positions shown · non-contrast
Comparison: Chest x-ray 7 scratched it chest CT 12/31/2016. Chest
x-ray 12/17/2016.

CLINICAL DATA: Pain midthoracic spine for 8 months.

EXAM:
THORACIC SPINE - 3 VIEWS

[t thoracic spine ap]
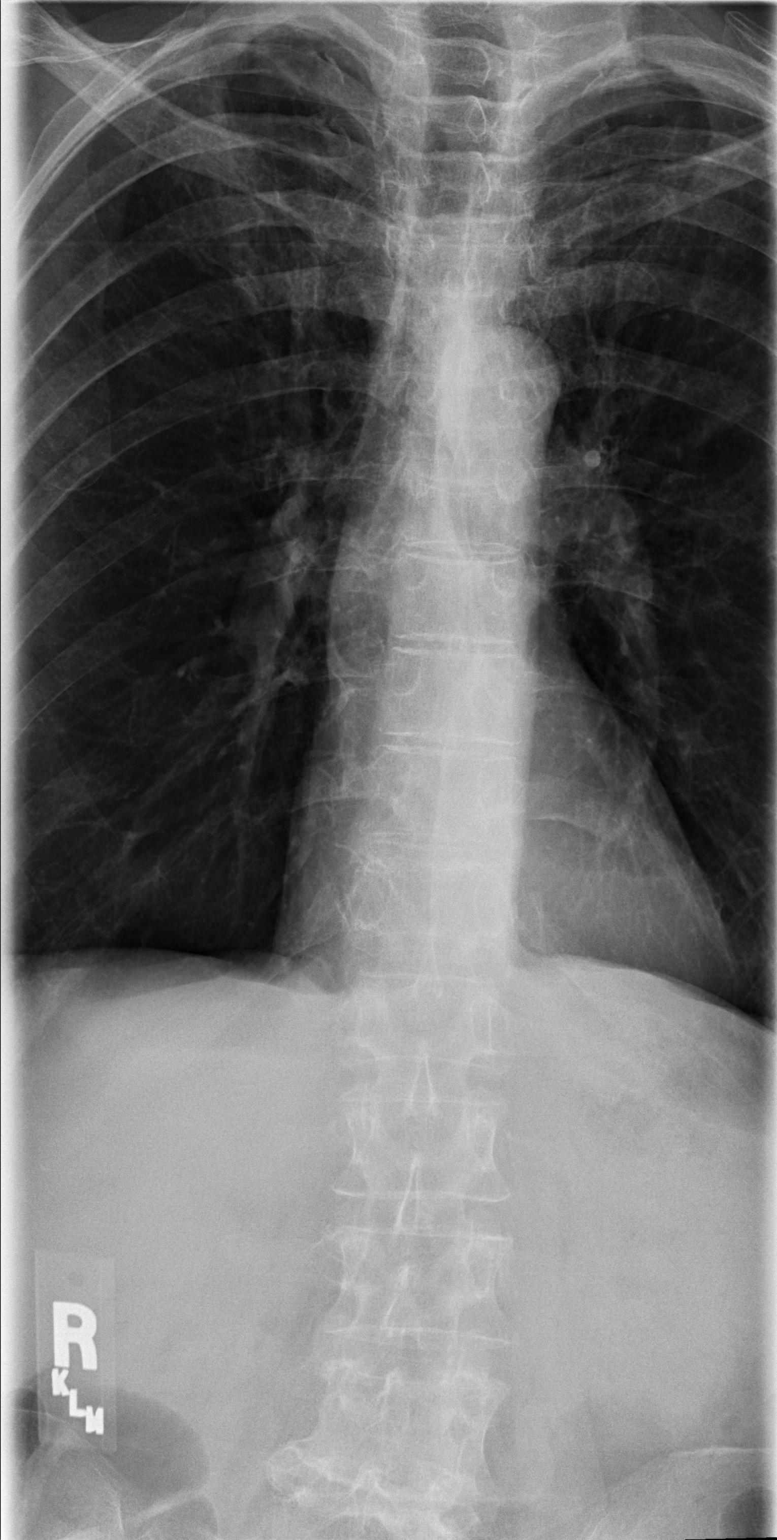

[t thoracic spine lat]
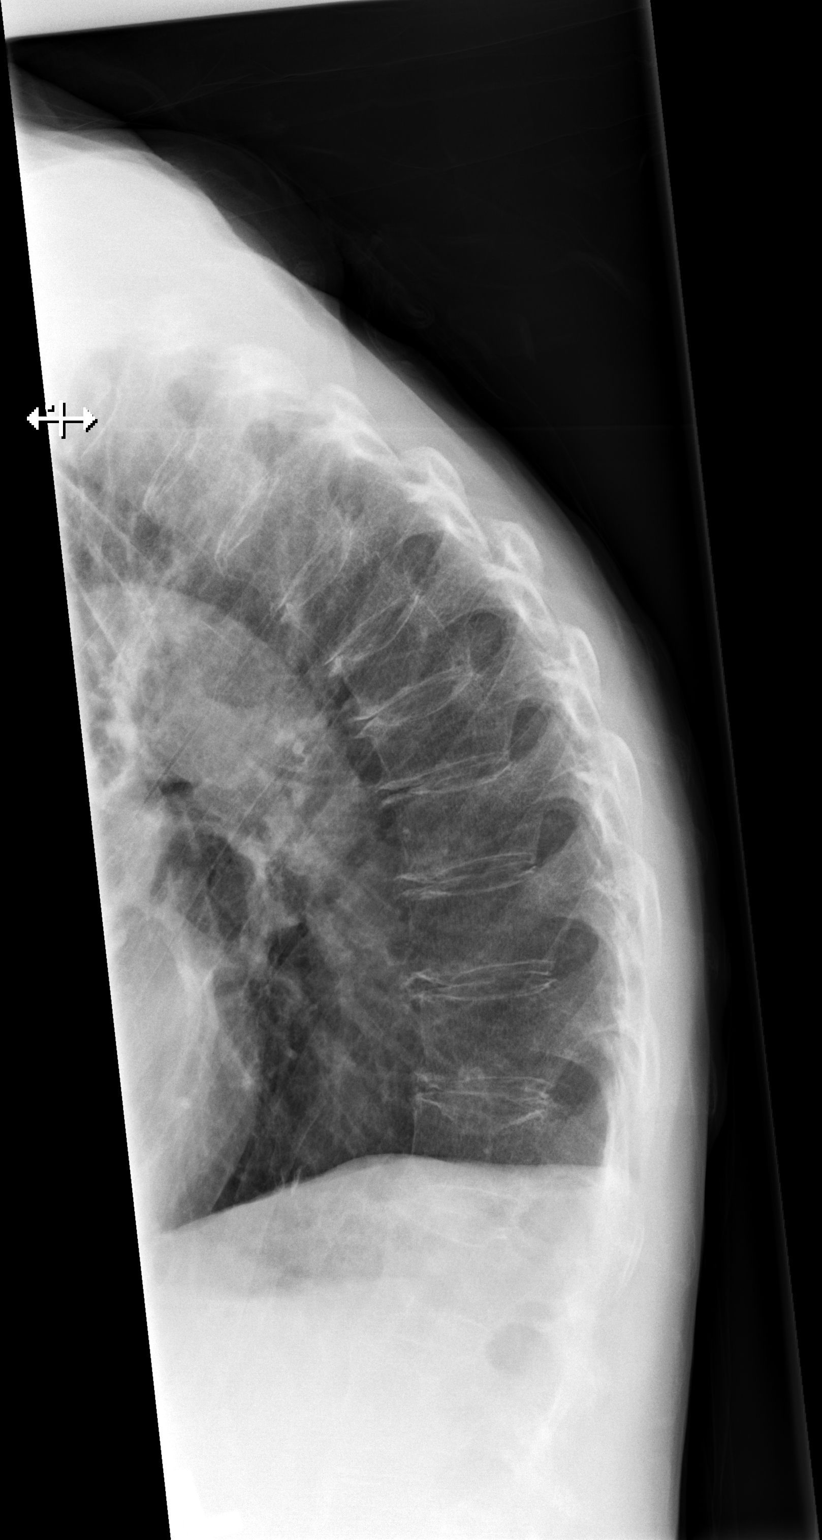

[t thoracic swimmers]
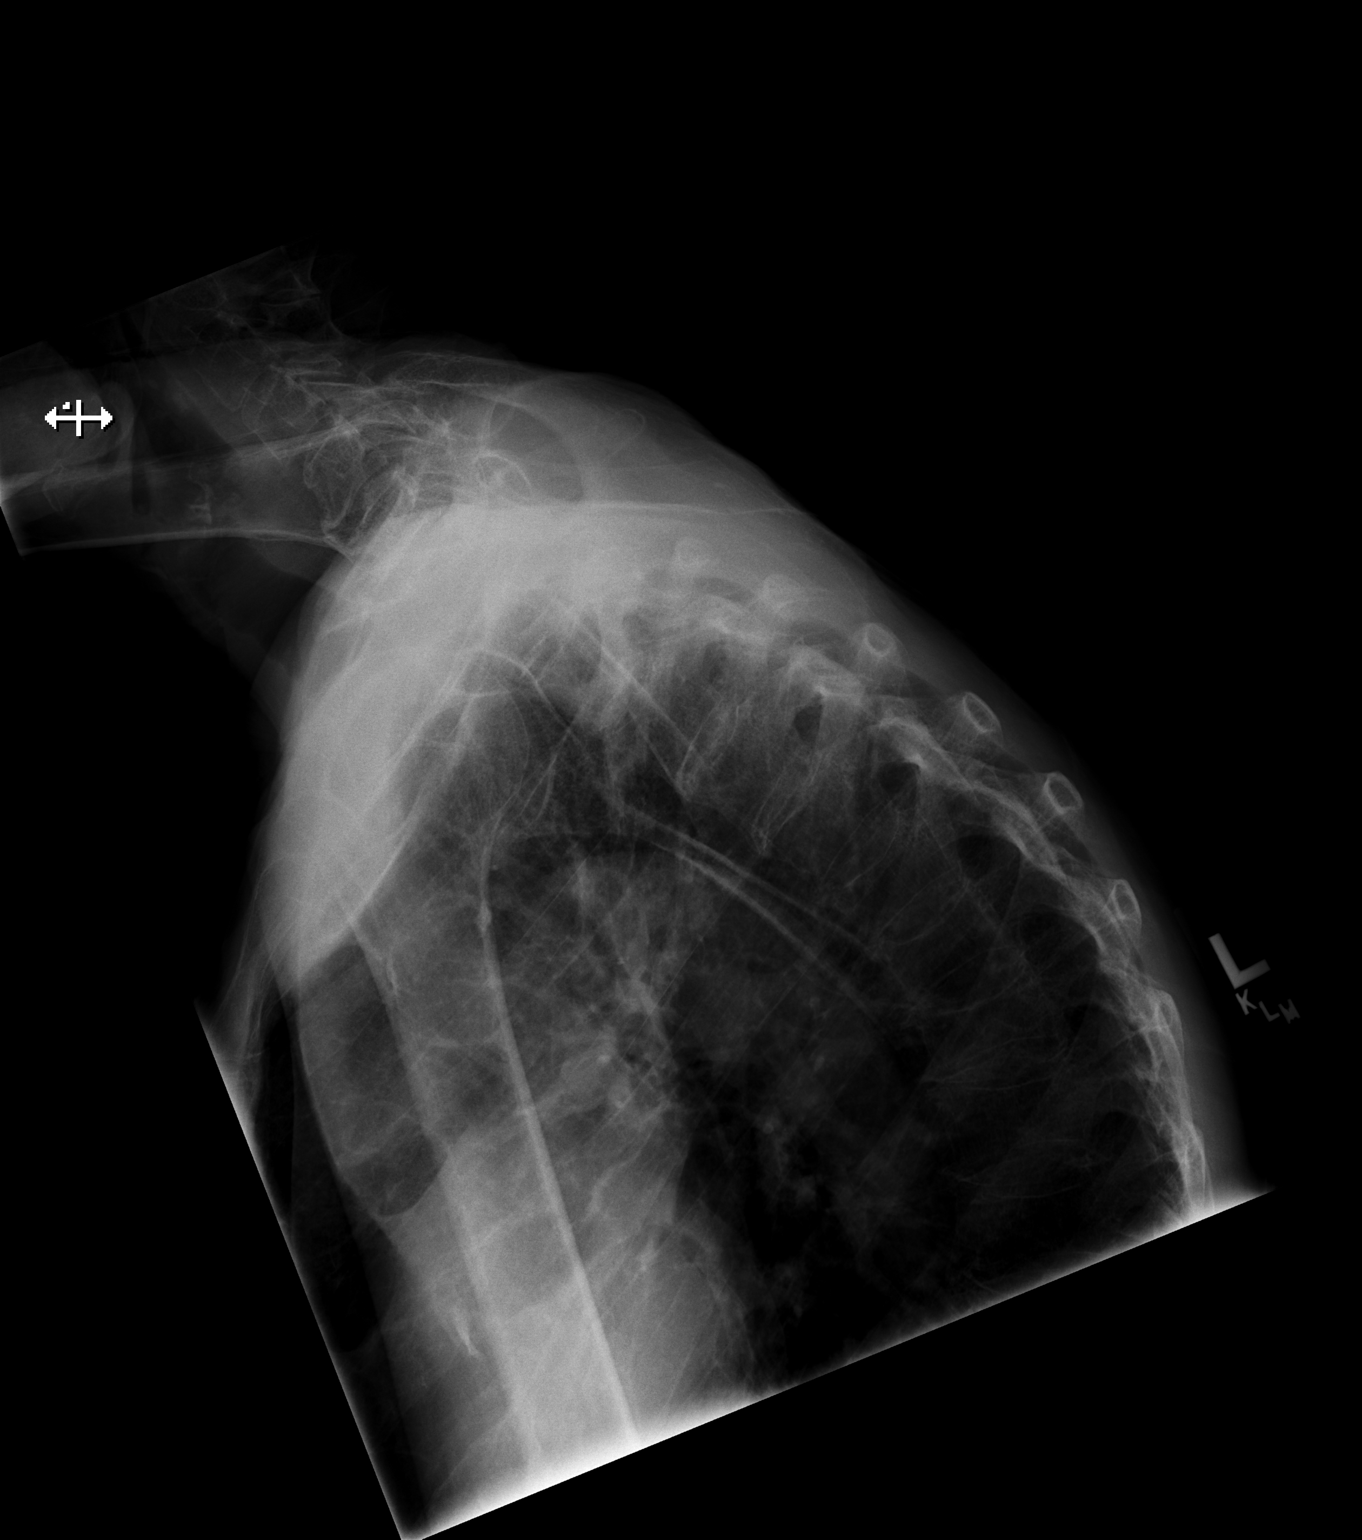

[3 of 3 positions shown; findings below may reference images not displayed]

FINDINGS: Thoracolumbar spine scoliosis. Diffuse multilevel degenerative
change. Diffuse osteopenia. Thoracic spine numbered with the lowest
visualized ribbed vertebra as T12. Stable mild compressions of T5,
T6, T7, and T12. Minimal compression of T11 cannot be completely
excluded .
IMPRESSION: 1. Stable mild compressions T5, T6, T7, and T12. Minimal compression
of T11 cannot be excluded. Further evaluation of the thoracic spine
for acute compression fractures can be obtained with thoracic MRI.

2. Thoracolumbar spine scoliosis. Diffuse osteopenia and
degenerative change.

## 2019-02-13 ENCOUNTER — Other Ambulatory Visit: Payer: BC Managed Care – PPO

## 2019-02-14 IMAGING — DX DG CHEST 1V PORT
1 series · 1 of 1 positions shown · non-contrast
Comparison: 06/17/2017

CLINICAL DATA: Chest tube in place

EXAM:
PORTABLE CHEST 1 VIEW

[chest ap]
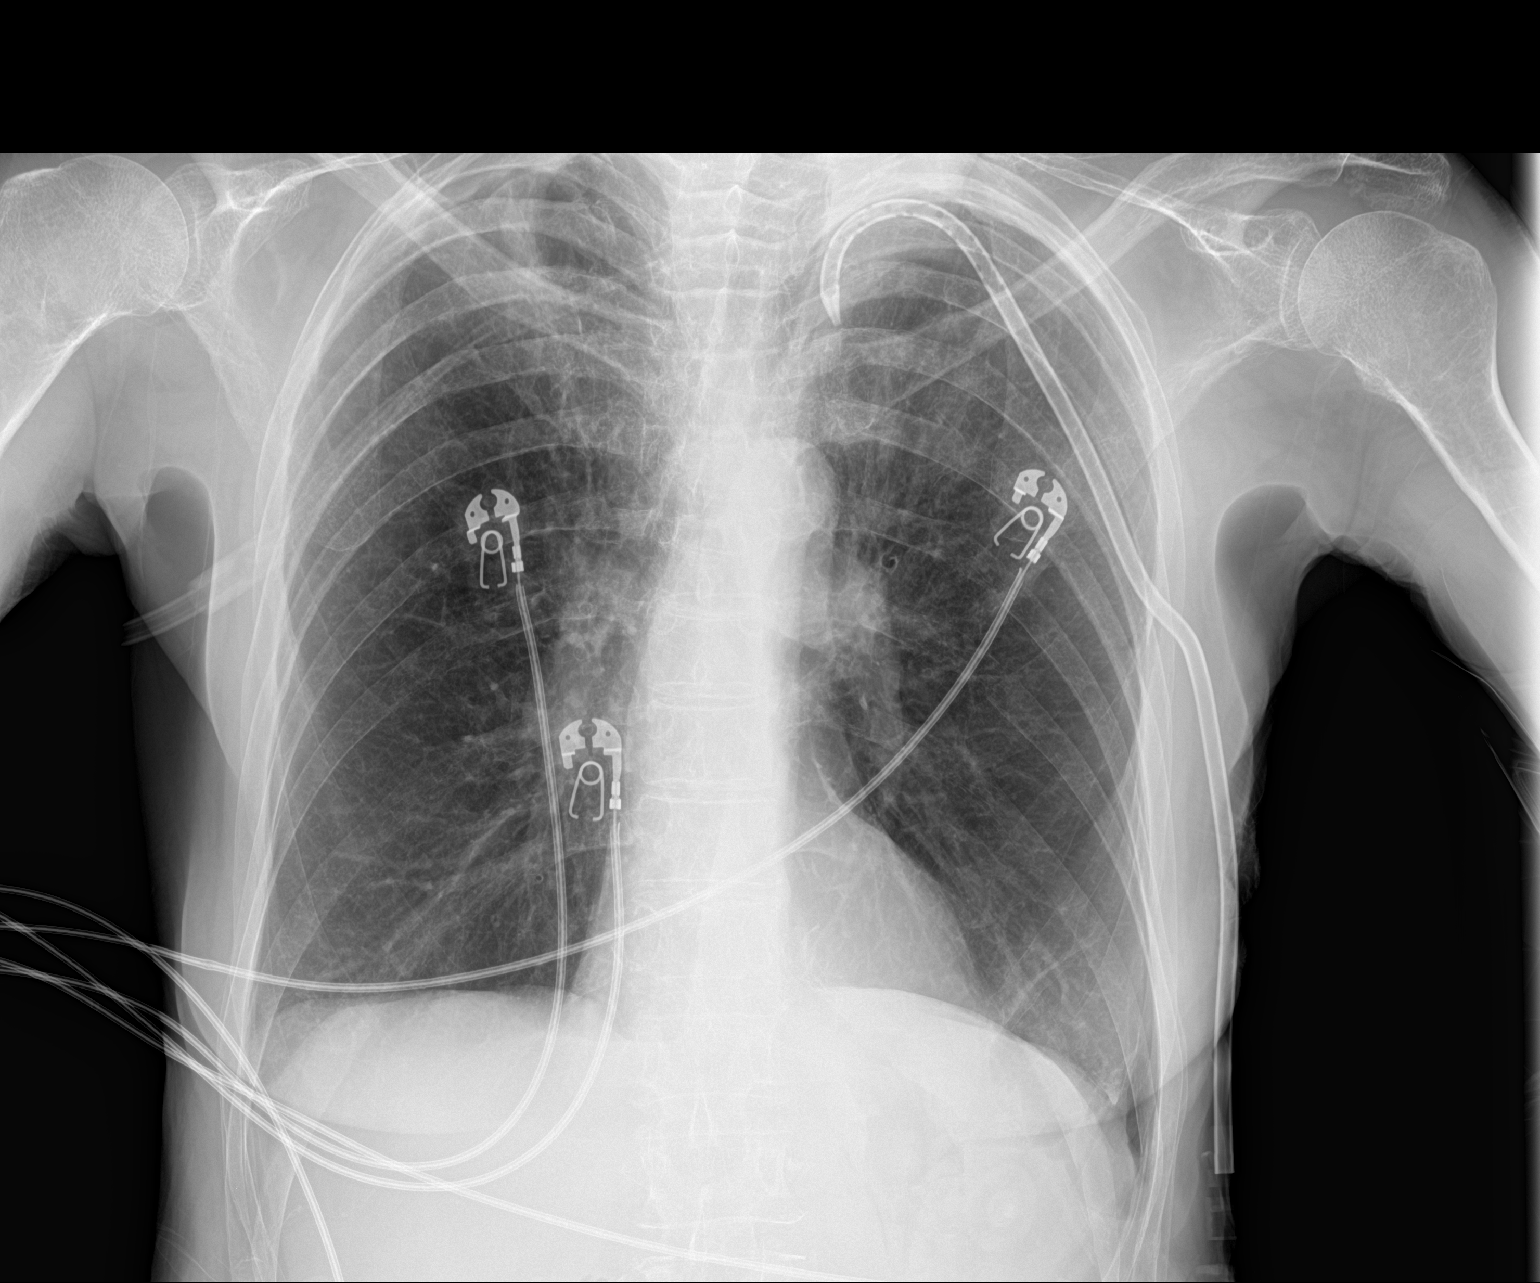

[1 of 1 positions shown; findings below may reference images not displayed]

FINDINGS: Interval placement of left chest tube. The pigtail is partially
formed at the left apex. Near complete re-expansion of the left lung
with only a very small left lateral pneumothorax at the left base.
Left base atelectasis. Heart is normal size.
IMPRESSION: Interval placement of left chest tube with pigtail partially formed
in the left upper chest. Near complete re-expansion of the left lung
with only a small residual left lateral basilar pneumothorax.

## 2019-02-14 IMAGING — DX DG CHEST 1V PORT
1 series · 1 of 1 positions shown · non-contrast
Comparison: Chest x-ray dated December 17, 2016.

CLINICAL DATA: Shortness of breath and low oxygen saturation, with
decreased breath sounds on the left.

EXAM:
PORTABLE CHEST 1 VIEW

[chest]
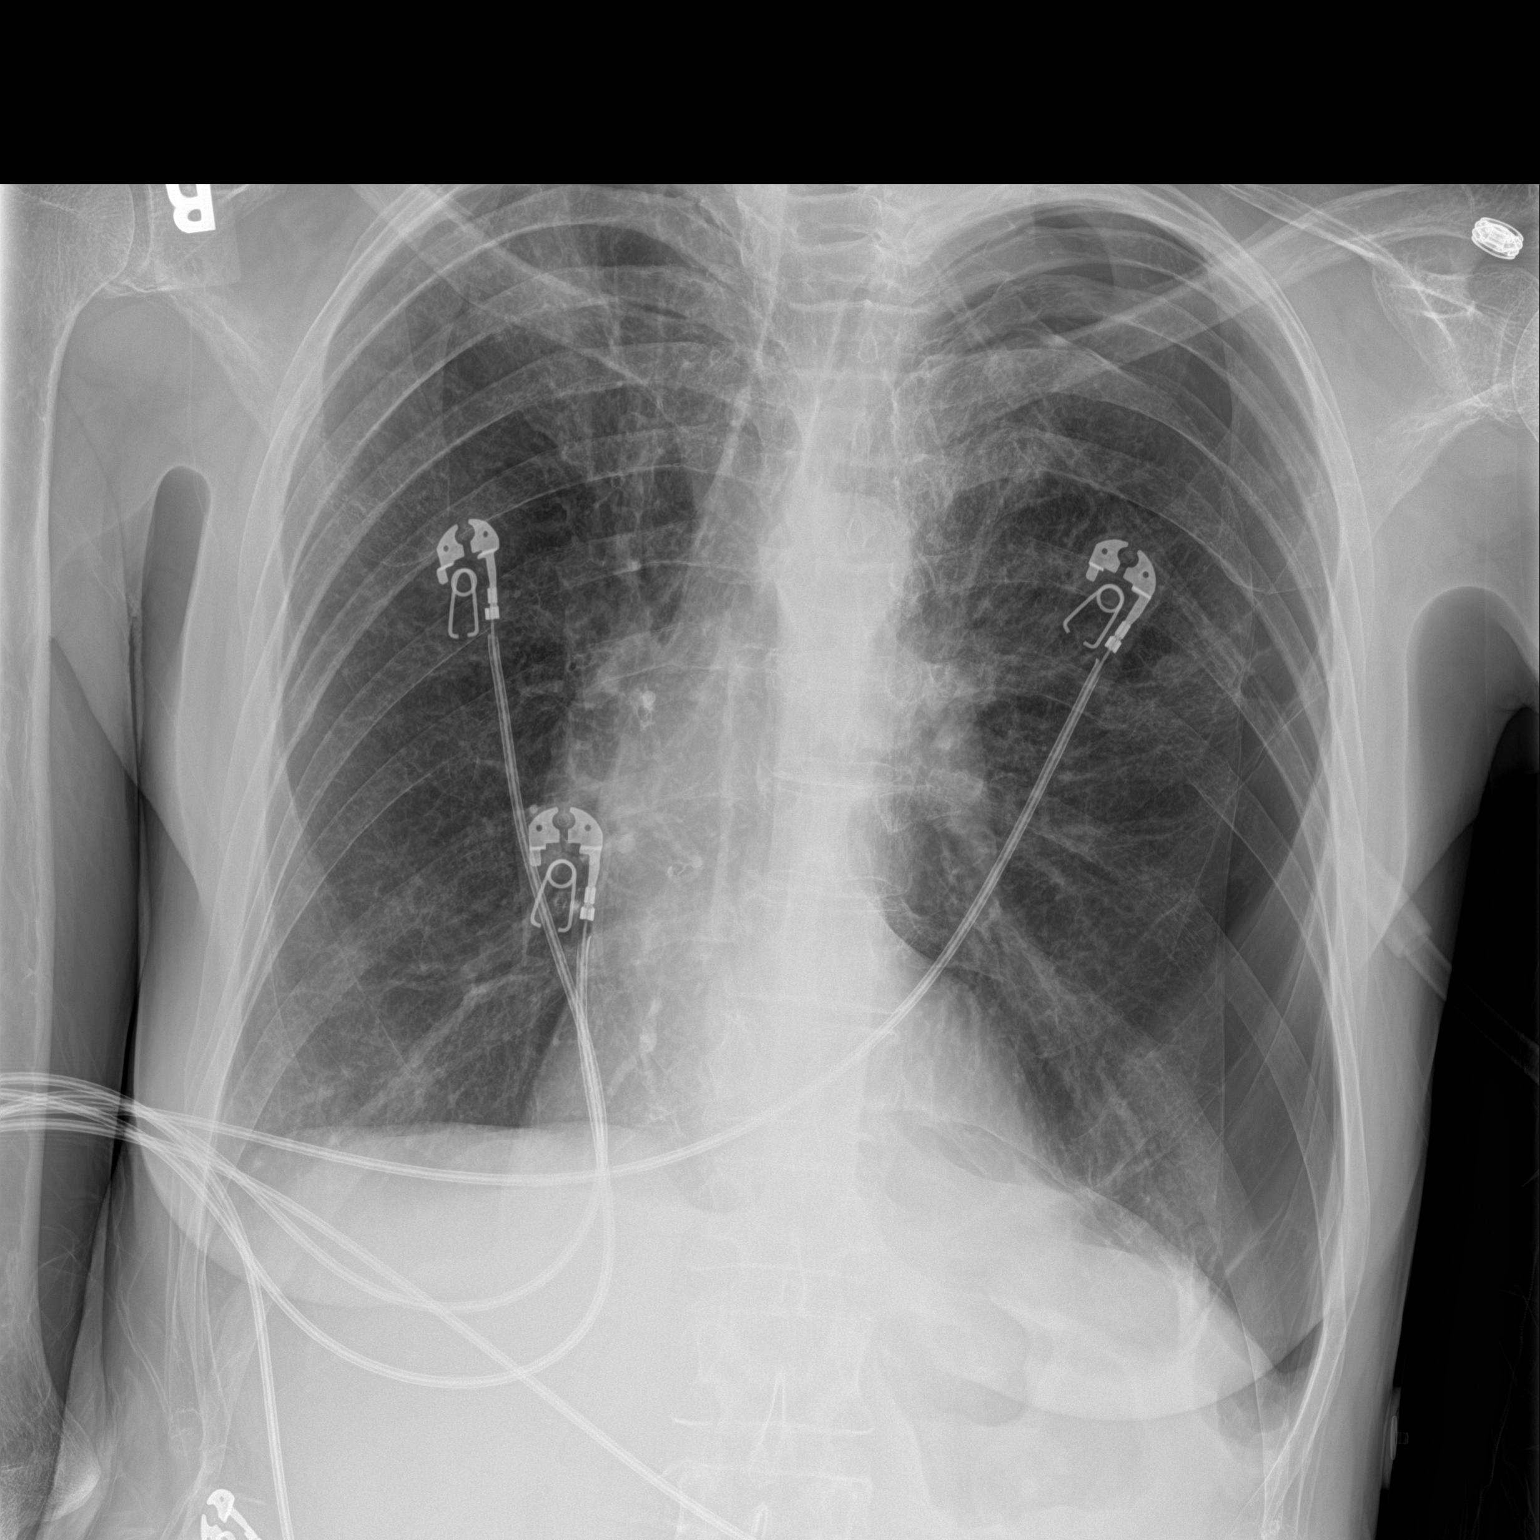

[1 of 1 positions shown; findings below may reference images not displayed]

FINDINGS: The cardiomediastinal silhouette is normal in size. Normal pulmonary
vascularity. New moderate left pneumothorax with slight mediastinal
shift to the right. Emphysematous changes in both lungs again noted.
10 mm nodule in the superior segment of the left lower lobe is
grossly unchanged. No acute osseous abnormality.
IMPRESSION: 1. New moderate left pneumothorax with slight mediastinal shift to
the right.
2. COPD.  10 mm nodule in the left lower lobe is grossly unchanged.
Critical Value/emergent results were called by telephone at the time
of interpretation on 06/17/2017 at [DATE] to Dr. JUDAH HINDS ,
who verbally acknowledged these results.

## 2019-03-05 ENCOUNTER — Other Ambulatory Visit: Payer: Self-pay | Admitting: Internal Medicine

## 2019-03-12 ENCOUNTER — Ambulatory Visit: Payer: BC Managed Care – PPO | Admitting: Internal Medicine

## 2019-03-12 ENCOUNTER — Ambulatory Visit: Payer: BC Managed Care – PPO | Admitting: Primary Care

## 2019-03-12 NOTE — Progress Notes (Deleted)
@Patient  ID: Darlene [name], female    DOB: 11-24-1955, 63 y.o.   MRN: [id number]  No chief complaint on file.   Referring provider: [name], MD  HPI: 63 year old female, current every day smoker (21 pack years). PMH significant for COPD GOLD III, emphysema, chronic respiratory failure with hypoxia, pulmonary cachexia, recurrent spontaneous pneumothorax. Patient of [name], last seen on 12/13/18. Maintained on Stiolto and oxygen.   03/12/2019 Patient presents today for      Spirometry 06/22/2017  FEV1 0.97 (39%)  Ratio 46 though < 6 sec exp    No Known Allergies  Immunization History  Administered Date(s) Administered   Influenza, High Dose Seasonal PF 03/22/2018   Influenza,inj,Quad PF,6+ Mos 03/14/2017   Pneumococcal Polysaccharide-23 07/22/2011, 06/21/2017    Past Medical History:  Diagnosis Date   Anxiety    COPD (chronic obstructive pulmonary disease) ([place])    Emphysema    Substance abuse ([place])    tobacco dependance    Tobacco History: Social History   Tobacco Use  Smoking Status Current Every Day Smoker   Packs/day: 0.50   Years: 42.00   Pack years: 21.00   Types: Cigarettes  Smokeless Tobacco Never Used  Tobacco Comment   quit Jan 2019 for 15 days and has started back up   Ready to quit: Not Answered Counseling given: Not Answered Comment: quit Jan 2019 for 15 days and has started back up   Outpatient Medications Prior to Visit  Medication Sig Dispense Refill   acetaminophen (TYLENOL) 500 MG tablet Take 500-1,000 mg by mouth every 6 (six) hours as needed (for pain).      albuterol (VENTOLIN HFA) 108 (90 Base) MCG/ACT inhaler Inhale 2 puffs into the lungs every 4 (four) hours as needed for wheezing or shortness of breath. 1 g 1   ALPRAZolam (XANAX) 0.25 MG tablet Take 0.25 mg by mouth daily.      Calcium Carb-Cholecalciferol (CALCIUM+D3 PO) Take 1 tablet by mouth every 14 (fourteen) days.      DULoxetine (CYMBALTA) 30 MG  capsule Take 30 mg by mouth daily.      famotidine (PEPCID) 20 MG tablet One at bedtime (Patient taking differently: Take 20 mg by mouth daily as needed for heartburn. ) 30 tablet 11   ibuprofen (ADVIL) 800 MG tablet Take 800 mg by mouth every 6 (six) hours as needed.     Respiratory Therapy Supplies (FLUTTER) DEVI Use as directed 1 each 0   SYMBICORT 160-4.5 MCG/ACT inhaler INHALE 2 PUFFS INTO THE LUNGS TWICE A DAY 30.6 Inhaler 1   Tiotropium Bromide Monohydrate (SPIRIVA RESPIMAT) 2.5 MCG/ACT AERS 2 pffs each am 1 g 11   Vitamin D, Ergocalciferol, (DRISDOL) 50000 units CAPS capsule Take 50,000 Units by mouth every 14 (fourteen) days.      No facility-administered medications prior to visit.       Review of Systems  Review of Systems   Physical Exam  There were no vitals taken for this visit. Physical Exam   Lab Results:  CBC    Component Value Date/Time   WBC 10.7 (H) 11/11/2018 1934   RBC 4.14 11/11/2018 1934   HGB 14.3 11/11/2018 1934   HCT 44.6 11/11/2018 1934   PLT 211 11/11/2018 1934   MCV 107.7 (H) 11/11/2018 1934   MCV 99.2 (A) 04/22/2015 1547   MCH 34.5 (H) 11/11/2018 1934   MCHC 32.1 11/11/2018 1934   RDW 12.6 11/11/2018 1934   LYMPHSABS 1.8 03/22/2018 1418  MONOABS 0.8 03/22/2018 1418   EOSABS 0.2 03/22/2018 1418   BASOSABS 0.1 03/22/2018 1418    BMET    Component Value Date/Time   NA 137 11/11/2018 1934   K 4.0 11/11/2018 1934   CL 100 11/11/2018 1934   CO2 23 11/11/2018 1934   GLUCOSE 89 11/11/2018 1934   BUN 9 11/11/2018 1934   CREATININE 0.42 (L) 11/11/2018 1934   CALCIUM 8.8 (L) 11/11/2018 1934   GFRNONAA >60 11/11/2018 1934   GFRAA >60 11/11/2018 1934    BNP No results found for: BNP  ProBNP    Component Value Date/Time   PROBNP 33.0 03/22/2018 1418    Imaging: No results found.   Assessment & Plan:   No problem-specific Assessment & Plan notes found for this encounter.     Darlene Ehrich, NP 03/12/2019

## 2019-03-29 ENCOUNTER — Telehealth: Payer: BC Managed Care – PPO | Admitting: Internal Medicine

## 2019-03-29 ENCOUNTER — Telehealth: Payer: BC Managed Care – PPO

## 2019-03-29 ENCOUNTER — Telehealth: Payer: BC Managed Care – PPO | Admitting: Physician Assistant

## 2019-03-29 DIAGNOSIS — R112 Nausea with vomiting, unspecified: Secondary | ICD-10-CM

## 2019-03-29 DIAGNOSIS — R197 Diarrhea, unspecified: Secondary | ICD-10-CM

## 2019-03-29 NOTE — Progress Notes (Signed)
Based on what you shared with me, I feel your condition warrants further evaluation and I recommend that you be seen for a face to face office visit. If you have had vomiting for 3 days, especially along with diarrhea, you need in-person assessment at Urgent Care or ER as there is concern you are dehydrated and as such you need IV fluids and assessment of your electrolyte levels.   NOTE: If you entered your credit card information for this eVisit, you will not be charged. You may see a "hold" on your card for the $35 but that hold will drop off and you will not have a charge processed.  If you are having a true medical emergency please call 911.     For an urgent face to face visit, [facility] has four urgent care centers for your convenience:   . [facility]    [phone]                  Get Driving Directions  [address], [place] [address] . 10 am to 8 pm Monday-Friday . 12 pm to 8 pm Saturday-Sunday   . [facility] at [place]                  Get Driving Directions  [address], [place], [place] [address] . 8 am to 8 pm Monday-Friday . 9 am to 6 pm Saturday . 11 am to 6 pm Sunday   . [facility] at [place]                  Get Driving Directions   [address] [place], [place] [address] . 8 am to 8 pm Monday-Friday . 8 am to 4 pm Saturday-Sunday    . [facility] at [place]                    Get Driving Directions  [phone]  [address], [place], [place] [address]  . Monday-Friday, 12 PM to 6 PM    Your e-visit answers were reviewed by a board certified advanced clinical practitioner to complete your personal care plan.  Thank you for using e-Visits.

## 2019-04-02 ENCOUNTER — Ambulatory Visit: Payer: BC Managed Care – PPO | Admitting: Internal Medicine

## 2019-04-23 ENCOUNTER — Other Ambulatory Visit: Payer: BC Managed Care – PPO

## 2019-09-02 ENCOUNTER — Other Ambulatory Visit: Payer: Self-pay

## 2019-09-02 ENCOUNTER — Encounter (HOSPITAL_COMMUNITY): Payer: Self-pay | Admitting: Emergency Medicine

## 2019-09-02 ENCOUNTER — Emergency Department (HOSPITAL_COMMUNITY): Payer: BC Managed Care – PPO

## 2019-09-02 ENCOUNTER — Inpatient Hospital Stay (HOSPITAL_COMMUNITY)
Admission: EM | Admit: 2019-09-02 | Discharge: 2019-09-13 | DRG: 208 | Disposition: E | Payer: BC Managed Care – PPO | Attending: Internal Medicine | Admitting: Internal Medicine

## 2019-09-02 DIAGNOSIS — Z66 Do not resuscitate: Secondary | ICD-10-CM | POA: Diagnosis present

## 2019-09-02 DIAGNOSIS — F17210 Nicotine dependence, cigarettes, uncomplicated: Secondary | ICD-10-CM | POA: Diagnosis present

## 2019-09-02 DIAGNOSIS — J9621 Acute and chronic respiratory failure with hypoxia: Principal | ICD-10-CM | POA: Diagnosis present

## 2019-09-02 DIAGNOSIS — Z681 Body mass index (BMI) 19 or less, adult: Secondary | ICD-10-CM

## 2019-09-02 DIAGNOSIS — E876 Hypokalemia: Secondary | ICD-10-CM | POA: Diagnosis not present

## 2019-09-02 DIAGNOSIS — Z820 Family history of epilepsy and other diseases of the nervous system: Secondary | ICD-10-CM

## 2019-09-02 DIAGNOSIS — I469 Cardiac arrest, cause unspecified: Secondary | ICD-10-CM | POA: Diagnosis present

## 2019-09-02 DIAGNOSIS — Z515 Encounter for palliative care: Secondary | ICD-10-CM | POA: Diagnosis not present

## 2019-09-02 DIAGNOSIS — R64 Cachexia: Secondary | ICD-10-CM | POA: Diagnosis present

## 2019-09-02 DIAGNOSIS — J439 Emphysema, unspecified: Secondary | ICD-10-CM | POA: Diagnosis present

## 2019-09-02 DIAGNOSIS — T1490XA Injury, unspecified, initial encounter: Secondary | ICD-10-CM

## 2019-09-02 DIAGNOSIS — Z800 Family history of malignant neoplasm of digestive organs: Secondary | ICD-10-CM

## 2019-09-02 DIAGNOSIS — Z7951 Long term (current) use of inhaled steroids: Secondary | ICD-10-CM

## 2019-09-02 DIAGNOSIS — E46 Unspecified protein-calorie malnutrition: Secondary | ICD-10-CM | POA: Diagnosis present

## 2019-09-02 DIAGNOSIS — Z7189 Other specified counseling: Secondary | ICD-10-CM | POA: Diagnosis not present

## 2019-09-02 DIAGNOSIS — E872 Acidosis: Secondary | ICD-10-CM | POA: Diagnosis not present

## 2019-09-02 DIAGNOSIS — Z20822 Contact with and (suspected) exposure to covid-19: Secondary | ICD-10-CM | POA: Diagnosis present

## 2019-09-02 DIAGNOSIS — R4020 Unspecified coma: Secondary | ICD-10-CM | POA: Diagnosis present

## 2019-09-02 DIAGNOSIS — R627 Adult failure to thrive: Secondary | ICD-10-CM | POA: Diagnosis present

## 2019-09-02 DIAGNOSIS — D751 Secondary polycythemia: Secondary | ICD-10-CM | POA: Diagnosis present

## 2019-09-02 DIAGNOSIS — L89121 Pressure ulcer of left upper back, stage 1: Secondary | ICD-10-CM | POA: Diagnosis present

## 2019-09-02 DIAGNOSIS — F419 Anxiety disorder, unspecified: Secondary | ICD-10-CM | POA: Diagnosis not present

## 2019-09-02 DIAGNOSIS — M810 Age-related osteoporosis without current pathological fracture: Secondary | ICD-10-CM | POA: Diagnosis present

## 2019-09-02 DIAGNOSIS — G9341 Metabolic encephalopathy: Secondary | ICD-10-CM | POA: Diagnosis not present

## 2019-09-02 DIAGNOSIS — L8990 Pressure ulcer of unspecified site, unspecified stage: Secondary | ICD-10-CM | POA: Insufficient documentation

## 2019-09-02 LAB — I-STAT CHEM 8, ED
BUN: 8 mg/dL (ref 8–23)
Calcium, Ion: 0.99 mmol/L — ABNORMAL LOW (ref 1.15–1.40)
Chloride: 109 mmol/L (ref 98–111)
Creatinine, Ser: 0.6 mg/dL (ref 0.44–1.00)
Glucose, Bld: 171 mg/dL — ABNORMAL HIGH (ref 70–99)
HCT: 42 % (ref 36.0–46.0)
Hemoglobin: 14.3 g/dL (ref 12.0–15.0)
Potassium: 3.6 mmol/L (ref 3.5–5.1)
Sodium: 138 mmol/L (ref 135–145)
TCO2: 18 mmol/L — ABNORMAL LOW (ref 22–32)

## 2019-09-02 LAB — CBC WITH DIFFERENTIAL/PLATELET
Abs Immature Granulocytes: 0.56 10*3/uL — ABNORMAL HIGH (ref 0.00–0.07)
Basophils Absolute: 0.1 10*3/uL (ref 0.0–0.1)
Basophils Relative: 1 %
Eosinophils Absolute: 0.2 10*3/uL (ref 0.0–0.5)
Eosinophils Relative: 1 %
HCT: 41.9 % (ref 36.0–46.0)
Hemoglobin: 13.4 g/dL (ref 12.0–15.0)
Immature Granulocytes: 4 %
Lymphocytes Relative: 13 %
Lymphs Abs: 2 10*3/uL (ref 0.7–4.0)
MCH: 37.6 pg — ABNORMAL HIGH (ref 26.0–34.0)
MCHC: 32 g/dL (ref 30.0–36.0)
MCV: 117.7 fL — ABNORMAL HIGH (ref 80.0–100.0)
Monocytes Absolute: 0.7 10*3/uL (ref 0.1–1.0)
Monocytes Relative: 5 %
Neutro Abs: 11.6 10*3/uL — ABNORMAL HIGH (ref 1.7–7.7)
Neutrophils Relative %: 76 %
Platelets: 171 10*3/uL (ref 150–400)
RBC: 3.56 MIL/uL — ABNORMAL LOW (ref 3.87–5.11)
RDW: 12.8 % (ref 11.5–15.5)
WBC: 15.3 10*3/uL — ABNORMAL HIGH (ref 4.0–10.5)
nRBC: 0 % (ref 0.0–0.2)

## 2019-09-02 LAB — CBC
HCT: 49.2 % — ABNORMAL HIGH (ref 36.0–46.0)
Hemoglobin: 16 g/dL — ABNORMAL HIGH (ref 12.0–15.0)
MCH: 36.7 pg — ABNORMAL HIGH (ref 26.0–34.0)
MCHC: 32.5 g/dL (ref 30.0–36.0)
MCV: 112.8 fL — ABNORMAL HIGH (ref 80.0–100.0)
Platelets: 193 10*3/uL (ref 150–400)
RBC: 4.36 MIL/uL (ref 3.87–5.11)
RDW: 12.6 % (ref 11.5–15.5)
WBC: 13.2 10*3/uL — ABNORMAL HIGH (ref 4.0–10.5)
nRBC: 0 % (ref 0.0–0.2)

## 2019-09-02 LAB — COMPREHENSIVE METABOLIC PANEL
ALT: 39 U/L (ref 0–44)
AST: 198 U/L — ABNORMAL HIGH (ref 15–41)
Albumin: 2.5 g/dL — ABNORMAL LOW (ref 3.5–5.0)
Alkaline Phosphatase: 91 U/L (ref 38–126)
Anion gap: 14 (ref 5–15)
BUN: 7 mg/dL — ABNORMAL LOW (ref 8–23)
CO2: 17 mmol/L — ABNORMAL LOW (ref 22–32)
Calcium: 7.7 mg/dL — ABNORMAL LOW (ref 8.9–10.3)
Chloride: 109 mmol/L (ref 98–111)
Creatinine, Ser: 0.49 mg/dL (ref 0.44–1.00)
GFR calc Af Amer: >60 mL/min (ref >60)
GFR calc non Af Amer: >60 mL/min (ref >60)
Glucose, Bld: 186 mg/dL — ABNORMAL HIGH (ref 70–99)
Potassium: 3.7 mmol/L (ref 3.5–5.1)
Sodium: 140 mmol/L (ref 135–145)
Total Bilirubin: 0.5 mg/dL (ref 0.3–1.2)
Total Protein: 4.9 g/dL — ABNORMAL LOW (ref 6.5–8.1)

## 2019-09-02 LAB — BRAIN NATRIURETIC PEPTIDE: B Natriuretic Peptide: 53.8 pg/mL (ref 0.0–100.0)

## 2019-09-02 LAB — BASIC METABOLIC PANEL
Anion gap: 13 (ref 5–15)
BUN: 6 mg/dL — ABNORMAL LOW (ref 8–23)
CO2: 18 mmol/L — ABNORMAL LOW (ref 22–32)
Calcium: 7.5 mg/dL — ABNORMAL LOW (ref 8.9–10.3)
Chloride: 105 mmol/L (ref 98–111)
Creatinine, Ser: 0.41 mg/dL — ABNORMAL LOW (ref 0.44–1.00)
GFR calc Af Amer: >60 mL/min (ref >60)
GFR calc non Af Amer: >60 mL/min (ref >60)
Glucose, Bld: 242 mg/dL — ABNORMAL HIGH (ref 70–99)
Potassium: 3.1 mmol/L — ABNORMAL LOW (ref 3.5–5.1)
Sodium: 136 mmol/L (ref 135–145)

## 2019-09-02 LAB — PROTIME-INR
INR: 1 (ref 0.8–1.2)
Prothrombin Time: 13 seconds (ref 11.4–15.2)

## 2019-09-02 LAB — ETHANOL: Alcohol, Ethyl (B): 108 mg/dL — ABNORMAL HIGH (ref <10)

## 2019-09-02 LAB — RESPIRATORY PANEL BY RT PCR (FLU A&B, COVID)
Influenza A by PCR: NEGATIVE
Influenza B by PCR: NEGATIVE
SARS Coronavirus 2 by RT PCR: NEGATIVE

## 2019-09-02 LAB — MRSA PCR SCREENING: MRSA by PCR: NEGATIVE

## 2019-09-02 LAB — APTT: aPTT: 30 seconds (ref 24–36)

## 2019-09-02 LAB — LACTIC ACID, PLASMA: Lactic Acid, Venous: 2.8 mmol/L (ref 0.5–1.9)

## 2019-09-02 LAB — HIV ANTIBODY (ROUTINE TESTING W REFLEX): HIV Screen 4th Generation wRfx: NONREACTIVE

## 2019-09-02 LAB — SALICYLATE LEVEL: Salicylate Lvl: <7 mg/dL — ABNORMAL LOW (ref 7.0–30.0)

## 2019-09-02 LAB — GLUCOSE, CAPILLARY: Glucose-Capillary: 241 mg/dL — ABNORMAL HIGH (ref 70–99)

## 2019-09-02 LAB — ACETAMINOPHEN LEVEL: Acetaminophen (Tylenol), Serum: <10 ug/mL — ABNORMAL LOW (ref 10–30)

## 2019-09-02 LAB — TROPONIN I (HIGH SENSITIVITY)
Troponin I (High Sensitivity): 21 ng/L — ABNORMAL HIGH (ref <18)
Troponin I (High Sensitivity): 34 ng/L — ABNORMAL HIGH (ref <18)

## 2019-09-02 LAB — CBG MONITORING, ED: Glucose-Capillary: 174 mg/dL — ABNORMAL HIGH (ref 70–99)

## 2019-09-02 MED ORDER — MIDAZOLAM HCL 2 MG/2ML IJ SOLN: 2.0000 mg | INTRAMUSCULAR | Status: DC | PRN

## 2019-09-02 MED ORDER — METHYLPREDNISOLONE SODIUM SUCC 125 MG IJ SOLR
INTRAMUSCULAR | Status: AC
  Filled 2019-09-02: qty 2

## 2019-09-02 MED ORDER — NOREPINEPHRINE 4 MG/250ML-% IV SOLN: 0.0000 ug/min | INTRAVENOUS | Status: DC

## 2019-09-02 MED ORDER — GLYCOPYRROLATE 0.2 MG/ML IJ SOLN
0.2000 mg | INTRAMUSCULAR | Status: DC | PRN
  Administered 2019-09-02 – 2019-09-04 (×2): 0.2 mg via INTRAVENOUS
  Filled 2019-09-02 (×2): qty 1

## 2019-09-02 MED ORDER — GLYCOPYRROLATE 0.2 MG/ML IJ SOLN: 0.2000 mg | INTRAMUSCULAR | Status: DC | PRN

## 2019-09-02 MED ORDER — DEXTROSE 5 % IV SOLN: INTRAVENOUS | Status: DC

## 2019-09-02 MED ORDER — ENOXAPARIN SODIUM 40 MG/0.4ML ~~LOC~~ SOLN: 40.0000 mg | SUBCUTANEOUS | Status: DC

## 2019-09-02 MED ORDER — IOHEXOL 350 MG/ML SOLN
100.0000 mL | Freq: Once | INTRAVENOUS | Status: AC | PRN
  Administered 2019-09-02: 100 mL via INTRAVENOUS

## 2019-09-02 MED ORDER — ACETAMINOPHEN 650 MG RE SUPP: 650.0000 mg | Freq: Four times a day (QID) | RECTAL | Status: DC | PRN

## 2019-09-02 MED ORDER — MORPHINE SULFATE (PF) 2 MG/ML IV SOLN
2.0000 mg | INTRAVENOUS | Status: DC | PRN
  Administered 2019-09-02 (×2): 2 mg via INTRAVENOUS
  Administered 2019-09-02 (×2): 4 mg via INTRAVENOUS
  Administered 2019-09-02 (×2): 2 mg via INTRAVENOUS
  Administered 2019-09-03 – 2019-09-04 (×8): 4 mg via INTRAVENOUS
  Filled 2019-09-02 (×3): qty 2
  Filled 2019-09-02: qty 1
  Filled 2019-09-02 (×2): qty 2
  Filled 2019-09-02: qty 1
  Filled 2019-09-02 (×6): qty 2

## 2019-09-02 MED ORDER — FENTANYL CITRATE (PF) 100 MCG/2ML IJ SOLN: 100.0000 ug | INTRAMUSCULAR | Status: DC | PRN

## 2019-09-02 MED ORDER — PANTOPRAZOLE SODIUM 40 MG IV SOLR: 40.0000 mg | Freq: Every day | INTRAVENOUS | Status: DC

## 2019-09-02 MED ORDER — MAGNESIUM SULFATE 2 GM/50ML IV SOLN
2.0000 g | Freq: Once | INTRAVENOUS | Status: AC
  Administered 2019-09-02: 2 g via INTRAVENOUS
  Filled 2019-09-02: qty 50

## 2019-09-02 MED ORDER — SODIUM CHLORIDE 0.9 % IV BOLUS
500.0000 mL | Freq: Once | INTRAVENOUS | Status: AC
  Administered 2019-09-02: 500 mL via INTRAVENOUS

## 2019-09-02 MED ORDER — HEPARIN SODIUM (PORCINE) 5000 UNIT/ML IJ SOLN
5000.0000 [IU] | Freq: Three times a day (TID) | INTRAMUSCULAR | Status: DC
  Administered 2019-09-02: 5000 [IU] via SUBCUTANEOUS
  Filled 2019-09-02: qty 1

## 2019-09-02 MED ORDER — POLYVINYL ALCOHOL 1.4 % OP SOLN
1.0000 [drp] | Freq: Four times a day (QID) | OPHTHALMIC | Status: DC | PRN
  Filled 2019-09-02: qty 15

## 2019-09-02 MED ORDER — SODIUM CHLORIDE 0.9 % IV SOLN
INTRAVENOUS | Status: AC | PRN
  Administered 2019-09-02: 1000 mL via INTRAVENOUS

## 2019-09-02 MED ORDER — DIPHENHYDRAMINE HCL 50 MG/ML IJ SOLN: 25.0000 mg | INTRAMUSCULAR | Status: DC | PRN

## 2019-09-02 MED ORDER — ACETAMINOPHEN 325 MG PO TABS: 650.0000 mg | ORAL_TABLET | Freq: Four times a day (QID) | ORAL | Status: DC | PRN

## 2019-09-02 MED ORDER — ROCURONIUM BROMIDE 50 MG/5ML IV SOLN
INTRAVENOUS | Status: AC | PRN
  Administered 2019-09-02: 100 mg via INTRAVENOUS

## 2019-09-02 MED ORDER — WHITE PETROLATUM EX OINT
TOPICAL_OINTMENT | CUTANEOUS | Status: AC
  Filled 2019-09-02: qty 28.35

## 2019-09-02 MED ORDER — IPRATROPIUM-ALBUTEROL 0.5-2.5 (3) MG/3ML IN SOLN
3.0000 mL | Freq: Four times a day (QID) | RESPIRATORY_TRACT | Status: DC
  Filled 2019-09-02: qty 3

## 2019-09-02 MED ORDER — ETOMIDATE 2 MG/ML IV SOLN
INTRAVENOUS | Status: AC | PRN
  Administered 2019-09-02: 20 mg via INTRAVENOUS

## 2019-09-02 MED ORDER — SODIUM CHLORIDE 0.9 % IV BOLUS
1000.0000 mL | Freq: Once | INTRAVENOUS | Status: AC
  Administered 2019-09-02: 1000 mL via INTRAVENOUS

## 2019-09-02 MED ORDER — FENTANYL CITRATE (PF) 100 MCG/2ML IJ SOLN: 50.0000 ug | INTRAMUSCULAR | Status: DC | PRN

## 2019-09-02 MED ORDER — FENTANYL CITRATE (PF) 100 MCG/2ML IJ SOLN
100.0000 ug | INTRAMUSCULAR | Status: DC | PRN
  Filled 2019-09-02: qty 2

## 2019-09-02 MED ORDER — METHYLPREDNISOLONE SODIUM SUCC 125 MG IJ SOLR
125.0000 mg | Freq: Once | INTRAMUSCULAR | Status: AC
  Administered 2019-09-02: 125 mg via INTRAVENOUS

## 2019-09-02 MED ORDER — GLYCOPYRROLATE 1 MG PO TABS
1.0000 mg | ORAL_TABLET | ORAL | Status: DC | PRN
  Filled 2019-09-02: qty 1

## 2019-09-02 MED ORDER — FENTANYL CITRATE (PF) 100 MCG/2ML IJ SOLN
50.0000 ug | INTRAMUSCULAR | Status: DC | PRN
  Administered 2019-09-02 (×2): 200 ug via INTRAVENOUS
  Filled 2019-09-02 (×2): qty 4

## 2019-09-02 MED ORDER — ASPIRIN 300 MG RE SUPP
300.0000 mg | RECTAL | Status: AC
  Administered 2019-09-02: 300 mg via RECTAL
  Filled 2019-09-02: qty 1

## 2019-09-02 NOTE — ED Notes (Addendum)
Patient's daughter/POA at bedside advised this RN she is attempting to get in contact with her brother and is planning to place patient under comfort care at some point, does not want foley placed at this time.

## 2019-09-02 NOTE — ED Notes (Signed)
[phone] pts brother Karma Ganja wants an update

## 2019-09-02 NOTE — Progress Notes (Signed)
Phlebotomy came by to draw labs, I asked pt's daughter if she would like [place] to draw labs still and she said no and agreed that there was no need to do anything to cause more suffering.

## 2019-09-02 NOTE — H&P (Signed)
NAME:  Darlene [name], MRN:  [id number], DOB:  1956/02/11, LOS: 0 ADMISSION DATE:  09/06/2019, CONSULTATION DATE:  08/25/2019 REFERRING MD: Rancour  , CHIEF COMPLAINT:  Cardiac arrest   Brief History   64 y.o. F with PMH of COPD Gold Stage III, tobacco use, recurrent pneumothorax who was brought in by EMS after life alert call.  Pt found unresponsive on the toilet and apneic.  [place]osc after 5-10 minutes CPR and has been unresponsive PCCM consulted for admission  History of present illness   Darlene [name] is a 64 year old female with gold stage II COPD with as needed home oxygen, continued tobacco use, recurrent pneumothorax, anxiety and substance abuse who apparently pushed her life alert and was found 10 minutes later slumped over on the toilet apneic.  EMS started CPR and had ROSC in approximately 5 minutes.  No mention of shockable rhythm, patient was a difficult intubation en route.  In the emergency department, patient has been unresponsive several hours since RSI medications were given.  Labs were significant for leukocytosis of 15 K, metabolic panel CO2 17, ABG pending, CT head and chest also pending along with Covid-19.  Patient's daughter is at the bedside and states that she has a living will and is DNR.  There have been no recent complaints of upper respiratory infection or chest pain.  She would like to be able to talk with her brother before discussing goals of care.  Past Medical History   has a past medical history of Anxiety, COPD (chronic obstructive pulmonary disease) ([place]), Emphysema, and Substance abuse ([place]).   Significant Hospital Events   3/21 admit to PCCM  Consults:    Procedures:  3/21 ET tube  Significant Diagnostic Tests:  3/21 CXR>>Patchy opacity in the periphery of the left lung with adjacent left eighth rib fracture, could reflect some pulmonary contusion in the setting of resuscitation.  Micro Data:  3/21 respiratory viral panel>> pending 3/21 blood  cultures x2.>>  Antimicrobials:    Interim history/subjective:  Patient remains intubated and unresponsive  Objective   Blood pressure (!) 168/102, pulse (!) 109, temperature (!) 95.7 F (35.4 C), temperature source Rectal, resp. rate 16, height 5' (1.524 m), weight 40.8 kg, SpO2 100 %.    Vent Mode: PRVC FiO2 (%):  [100 %] 100 % Set Rate:  [16 bmp] 16 bmp Vt Set:  [400 mL] 400 mL PEEP:  [5 cmH20] 5 cmH20 Plateau Pressure:  [14 cmH20] 14 cmH20  No intake or output data in the 24 hours ending 09/05/2019 0523 Filed Weights   08/15/2019 0310  Weight: 40.8 kg   General: Thin and frail elderly female intubated HEENT: MM pink/moist Neuro: Unresponsive to pain, not breathing over the vent, pupils 3 mm minimally responsive to light, no corneal reflex, no gag CV: s1s2 regular rate and rhythm, no m/r/g PULM: Poor air movement and coarse breath sounds bilaterally GI: soft, bsx4 active  Extremities: warm/dry, no edema  Skin: no rashes or lesions   Resolved Hospital Problem list     Assessment & Plan:   Acute cardiopulmonary arrest -Unresponsive postarrest, patient has a living will and is DNR, daughter considering transition to comfort care when she is able to talk with her brother therefore we will not initiate targeted temperature management -Troponin minimally elevated and no STEMI on EKG P: -Continue supportive care with full vent support, not requiring any pressors -Maintain normothermia -Obtain EEG and echocardiogram unless transition made to comfort care -Both CT head and CT  chest results are pending -ABG and respiratory viral panel are pending -Aspirin -titrate Vent setting to maintain SpO2 greater than or equal to 90%. -HOB elevated 30 degrees. -Plateau pressures less than 30 cm H20.  -Follow chest x-ray, ABG prn.   -Bronchial hygiene and RT/bronchodilator protocol.  Gold stage III COPD -Uses as needed supplemental oxygen at home, not wheezing on exam and daughter  reports no recent COPD exacerbation symptoms -Solu-Medrol 125 mg in the ED P: -Scheduled nebulizers, patient is not wheezing on exam we will not continue steroids at this time -Patchy opacity in the L lung inflammatory versus contusion versus infiltrate, cover for possible developing CAP with doxycycline and ceftriaxone  History of EtOH and substance use -Daughter reports she is drinking daily, is unsure how much -No history of seizures or withdrawal P: -Alcohol level and urine drug screen are pending      Best practice:  Diet: N.p.o. Pain/Anxiety/Delirium protocol (if indicated): Fentanyl VAP protocol (if indicated): HOB elevated 30 degrees, SBT as tolerated DVT prophylaxis: Heparin GI prophylaxis: Protonix Glucose control: SSI Mobility: Bedrest Code Status: DNR Family Communication: Daughter updated at the bedside Disposition: Intensive care unit  Labs   CBC: Recent Labs  Lab 09/09/2019 0313 08/23/2019 0344  WBC 15.3*  --   NEUTROABS 11.6*  --   HGB 13.4 14.3  HCT 41.9 42.0  MCV 117.7*  --   PLT 171  --     Basic Metabolic Panel: Recent Labs  Lab 08/22/2019 0313 08/17/2019 0344  NA 140 138  K 3.7 3.6  CL 109 109  CO2 17*  --   GLUCOSE 186* 171*  BUN 7* 8  CREATININE 0.49 0.60  CALCIUM 7.7*  --    GFR: Estimated Creatinine Clearance: 45.8 mL/min (by C-G formula based on SCr of 0.6 mg/dL). Recent Labs  Lab 09/01/2019 0313  WBC 15.3*    Liver Function Tests: Recent Labs  Lab 08/16/2019 0313  AST 198*  ALT 39  ALKPHOS 91  BILITOT 0.5  PROT 4.9*  ALBUMIN 2.5*   No results for input(s): LIPASE, AMYLASE in the last 168 hours. No results for input(s): AMMONIA in the last 168 hours.  ABG    Component Value Date/Time   TCO2 18 (L) 08/27/2019 0344     Coagulation Profile: No results for input(s): INR, PROTIME in the last 168 hours.  Cardiac Enzymes: No results for input(s): CKTOTAL, CKMB, CKMBINDEX, TROPONINI in the last 168 hours.  HbA1C: No  results found for: HGBA1C  CBG: Recent Labs  Lab 08/31/2019 0308  GLUCAP 174*    Review of Systems:   Unable to obtain secondary to intubation  Past Medical History  She,  has a past medical history of Anxiety, COPD (chronic obstructive pulmonary disease) ([place]), Emphysema, and Substance abuse ([place]).   Surgical History    Past Surgical History:  Procedure Laterality Date  . BACK SURGERY    . KNEE SURGERY       Social History   reports that she has been smoking cigarettes. She has a 21.00 pack-year smoking history. She has never used smokeless tobacco. She reports current alcohol use of about 5.0 standard drinks of alcohol per week. She reports that she does not use drugs.   Family History   Her family history includes Alzheimer's disease in her mother; Cancer in her paternal aunt.   Allergies No Known Allergies   Home Medications  Prior to Admission medications   Medication Sig Start Date End Date Taking? Authorizing  Provider  acetaminophen (TYLENOL) 500 MG tablet Take 500-1,000 mg by mouth every 6 (six) hours as needed (for pain).     [provider]  albuterol (VENTOLIN HFA) 108 (90 Base) MCG/ACT inhaler Inhale 2 puffs into the lungs every 4 (four) hours as needed for wheezing or shortness of breath. 12/13/18   Tanda Rockers, MD  ALPRAZolam Duanne Moron) 0.25 MG tablet Take 0.25 mg by mouth daily.     [provider]  Calcium Carb-Cholecalciferol (CALCIUM+D3 PO) Take 1 tablet by mouth every 14 (fourteen) days.     [provider]  DULoxetine (CYMBALTA) 30 MG capsule Take 30 mg by mouth daily.  09/04/18   [provider]  famotidine (PEPCID) 20 MG tablet One at bedtime Patient taking differently: Take 20 mg by mouth daily as needed for heartburn.  03/22/18   Tanda Rockers, MD  ibuprofen (ADVIL) 800 MG tablet Take 800 mg by mouth every 6 (six) hours as needed.    [provider]  Respiratory Therapy Supplies (FLUTTER) DEVI Use as directed  03/22/18   Tanda Rockers, MD  SYMBICORT 160-4.5 MCG/ACT inhaler INHALE 2 PUFFS INTO THE LUNGS TWICE A DAY 03/05/19   Tanda Rockers, MD  Tiotropium Bromide Monohydrate (SPIRIVA RESPIMAT) 2.5 MCG/ACT AERS 2 pffs each am 12/13/18   Tanda Rockers, MD  Vitamin D, Ergocalciferol, (DRISDOL) 50000 units CAPS capsule Take 50,000 Units by mouth every 14 (fourteen) days.     [provider]     Critical care time: 58 minutes      CRITICAL CARE Performed by: Otilio Carpen Terion Hedman   Total critical care time: 58 minutes  Critical care time was exclusive of separately billable procedures and treating other patients.  Critical care was necessary to treat or prevent imminent or life-threatening deterioration.  Critical care was time spent personally by me on the following activities: development of treatment plan with patient and/or surrogate as well as nursing, discussions with consultants, evaluation of patient's response to treatment, examination of patient, obtaining history from patient or surrogate, ordering and performing treatments and interventions, ordering and review of laboratory studies, ordering and review of radiographic studies, pulse oximetry and re-evaluation of patient's condition.  Otilio Carpen Kenyata Napier, PA-C

## 2019-09-02 NOTE — ED Notes (Signed)
Critical Care at bedside.  

## 2019-09-02 NOTE — Progress Notes (Signed)
RT note: RT and RN transported vent patient from ED to 79M room 4. Vital signs stable through out.

## 2019-09-02 NOTE — ED Notes (Signed)
Chaplain at bedside

## 2019-09-02 NOTE — ED Notes (Signed)
Ice packs applied to axilla and groin per Dr. Vevelyn Francois verbal order.

## 2019-09-02 NOTE — Procedures (Signed)
Extubation Procedure Note  Patient Details:   Name: Darlene [name] DOB: 1956-02-28 MRN: [id number]   Airway Documentation:    Vent end date: 09/07/2019 Vent end time: 1150   Evaluation  O2 sats: stable throughout Complications: No apparent complications Patient did tolerate procedure well. Bilateral Breath Sounds: Clear, Diminished   No, terminal extubation per MD order and family request.  Gonzella Lex 09/03/2019, 11:55 AM

## 2019-09-02 NOTE — ED Provider Notes (Signed)
[facility] EMERGENCY DEPARTMENT Provider Note   CSN: [id number] Arrival date & time: 08/22/2019  0300     History Chief Complaint  Patient presents with  . Cardiac Arrest    Darlene [name] is a 64 y.o. female.  Level 5 caveat for unresponsiveness.  Patient brought in by EMS status post CPR.  Patient apparently across to her emergency alert button for a fall.  She was found to be on the toilet slumped over unresponsive and apneic.  She was down approximately 10 minutes before CPR was started by the fire department.  She had CPR performed for about 5 minutes and ROSC was obtained.  Did not receive any medications.  EMS had difficulty with intubation nasal and oral intubation because of airway swelling.  She was given 5 mm grams of midazolam during attempted intubation.  On arrival patient has pulses but is unresponsive.  She is being bagged. Past medical history includes COPD, emphysema and substance abuse.  The history is provided by the patient and the EMS personnel. The history is limited by the condition of the patient.  Cardiac Arrest      Past Medical History:  Diagnosis Date  . Anxiety   . COPD (chronic obstructive pulmonary disease) ([place])   . Emphysema   . Substance abuse ([place])    tobacco dependance    Patient Active Problem List   Diagnosis Date Noted  . Chronic respiratory failure with hypoxia and hypercapnia ([place]) 03/23/2018  . DOE (dyspnea on exertion) 03/22/2018  . Chest pain, musculoskeletal 12/19/2017  . Chronic respiratory failure with hypoxia ([facility])/ noct hypoxemia 10/30/2017  . Recurrent spontaneous pneumothorax 07/18/2017  . Nodule of apex of right lung 06/18/2017  . Spontaneous pneumothorax on L 06/17/17 06/17/2017  . Acute respiratory failure with hypoxia ([place]) 06/17/2017  . Anxiety disorder 06/17/2017  . Tobacco abuse 06/17/2017  . Severe protein-calorie malnutrition ([place]) 06/17/2017  . 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 Osteoporosis 06/17/2017  . COPD with emphysema ([place]) 06/17/2017  . Anxiety   . COPD (chronic obstructive pulmonary disease) ([place])   . Substance abuse ([place])   . Pulmonary nodules 07/10/2014  . Cough 03/17/2013  . Cigarette smoker 12/30/2012  . COPD GOLD III  12/29/2012    Past Surgical History:  Procedure Laterality Date  . BACK SURGERY    . KNEE SURGERY       OB History   No obstetric history on file.     Family History  Problem Relation Age of Onset  . Alzheimer's disease Mother   . Cancer Paternal Aunt        colon    Social History   Tobacco Use  . Smoking status: Current Every Day Smoker    Packs/day: 0.50    Years: 42.00    Pack years: 21.00    Types: Cigarettes  . Smokeless tobacco: Never Used  . Tobacco comment: quit Jan 2019 for 15 days and has started back up  Substance Use Topics  . Alcohol use: Yes    Alcohol/week: 5.0 standard drinks    Types: 5 Cans of beer per week    Comment: 3-5 cans three to four timesa week  . Drug use: No    Home Medications Prior to Admission medications   Medication Sig Start Date End Date Taking? Authorizing Provider  acetaminophen (TYLENOL) 500 MG tablet Take 500-1,000 mg by mouth every 6 (six) hours as needed (for pain).     [provider]  albuterol (VENTOLIN HFA) 108 (90 Base) MCG/ACT inhaler Inhale 2 puffs into the lungs every 4 (four) hours as needed for wheezing or shortness of breath. 12/13/18   Tanda Rockers, MD  ALPRAZolam Duanne Moron) 0.25 MG tablet Take 0.25 mg by mouth daily.     [provider]  Calcium Carb-Cholecalciferol (CALCIUM+D3 PO) Take 1 tablet by mouth every 14 (fourteen) days.     [provider]  DULoxetine (CYMBALTA) 30 MG capsule Take 30 mg by mouth daily.  09/04/18   [provider]  famotidine (PEPCID) 20 MG tablet One at bedtime Patient taking differently: Take 20 mg by mouth daily as needed for heartburn.  03/22/18   Tanda Rockers, MD    ibuprofen (ADVIL) 800 MG tablet Take 800 mg by mouth every 6 (six) hours as needed.    [provider]  Respiratory Therapy Supplies (FLUTTER) DEVI Use as directed 03/22/18   Tanda Rockers, MD  SYMBICORT 160-4.5 MCG/ACT inhaler INHALE 2 PUFFS INTO THE LUNGS TWICE A DAY 03/05/19   Tanda Rockers, MD  Tiotropium Bromide Monohydrate (SPIRIVA RESPIMAT) 2.5 MCG/ACT AERS 2 pffs each am 12/13/18   Tanda Rockers, MD  Vitamin D, Ergocalciferol, (DRISDOL) 50000 units CAPS capsule Take 50,000 Units by mouth every 14 (fourteen) days.     [provider]    Allergies    Patient has no known allergies.  Review of Systems   Review of Systems  Unable to perform ROS: Acuity of condition    Physical Exam Updated Vital Signs BP 99/74   Pulse (!) 125   Temp (!) 95.7 F (35.4 C) (Rectal)   Resp 16   Ht 5' (1.524 m)   Wt 40.8 kg   SpO2 100%   BMI 17.58 kg/m   Physical Exam Constitutional:      Comments: Cachectic, unresponsive  HENT:     Nose:     Comments: Blood in oropharynx, blood in nose. Nasal trumpet in place. Being bagged Eyes:     Comments: Fixed mid range pupils  Neck:     Comments: C-collar in place Cardiovascular:     Rate and Rhythm: Tachycardia present.  Pulmonary:     Breath sounds: Wheezing present.     Comments: Equal breath sounds with bagging Abdominal:     Tenderness: There is no abdominal tenderness.  Musculoskeletal:     Comments: Pelvis stable  Skin tear left upper arm  Neurological:     Comments: Unresponsive GCS 3     ED Results / Procedures / Treatments   Labs (all labs ordered are listed, but only abnormal results are displayed) Labs Reviewed  LACTIC ACID, PLASMA - Abnormal; Notable for the following components:      Result Value   Lactic Acid, Venous 2.8 (*)    All other components within normal limits  CBC WITH DIFFERENTIAL/PLATELET - Abnormal; Notable for the following components:   WBC 15.3 (*)    RBC 3.56 (*)    MCV  117.7 (*)    MCH 37.6 (*)    Neutro Abs 11.6 (*)    Abs Immature Granulocytes 0.56 (*)    All other components within normal limits  COMPREHENSIVE METABOLIC PANEL - Abnormal; Notable for the following components:   CO2 17 (*)    Glucose, Bld 186 (*)    BUN 7 (*)    Calcium 7.7 (*)    Total Protein 4.9 (*)    Albumin 2.5 (*)  AST 198 (*)    All other components within normal limits  ETHANOL - Abnormal; Notable for the following components:   Alcohol, Ethyl (B) 108 (*)    All other components within normal limits  ACETAMINOPHEN LEVEL - Abnormal; Notable for the following components:   Acetaminophen (Tylenol), Serum <10 (*)    All other components within normal limits  SALICYLATE LEVEL - Abnormal; Notable for the following components:   Salicylate Lvl <8.1 (*)    All other components within normal limits  BASIC METABOLIC PANEL - Abnormal; Notable for the following components:   Potassium 3.1 (*)    CO2 18 (*)    Glucose, Bld 242 (*)    BUN 6 (*)    Creatinine, Ser 0.41 (*)    Calcium 7.5 (*)    All other components within normal limits  CBC - Abnormal; Notable for the following components:   WBC 13.2 (*)    Hemoglobin 16.0 (*)    HCT 49.2 (*)    MCV 112.8 (*)    MCH 36.7 (*)    All other components within normal limits  CBG MONITORING, ED - Abnormal; Notable for the following components:   Glucose-Capillary 174 (*)    All other components within normal limits  I-STAT CHEM 8, ED - Abnormal; Notable for the following components:   Glucose, Bld 171 (*)    Calcium, Ion 0.99 (*)    TCO2 18 (*)    All other components within normal limits  TROPONIN I (HIGH SENSITIVITY) - Abnormal; Notable for the following components:   Troponin I (High Sensitivity) 21 (*)    All other components within normal limits  TROPONIN I (HIGH SENSITIVITY) - Abnormal; Notable for the following components:   Troponin I (High Sensitivity) 34 (*)    All other components within normal limits    RESPIRATORY PANEL BY RT PCR (FLU A&B, COVID)  CULTURE, BLOOD (ROUTINE X 2)  CULTURE, BLOOD (ROUTINE X 2)  BRAIN NATRIURETIC PEPTIDE  PROTIME-INR  APTT  LACTIC ACID, PLASMA  URINALYSIS, ROUTINE W REFLEX MICROSCOPIC  RAPID URINE DRUG SCREEN, HOSP PERFORMED  BLOOD GAS, ARTERIAL  HIV ANTIBODY (ROUTINE TESTING W REFLEX)  BASIC METABOLIC PANEL  BLOOD GAS, ARTERIAL  MAGNESIUM  HEMOGLOBIN A1C  POC OCCULT BLOOD, ED  I-STAT ARTERIAL BLOOD GAS, ED    EKG EKG Interpretation  Date/Time:  Sunday September 02 2019 03:05:29 EDT Ventricular Rate:  128 PR Interval:    QRS Duration: 74 QT Interval:  343 QTC Calculation: 501 R Axis:   79 Text Interpretation: Sinus tachycardia Borderline T abnormalities, inferior leads Prolonged QT interval No significant change was found Confirmed by Ezequiel Essex [phone]) on 09/07/2019 3:20:34 AM   Radiology CT Head Wo Contrast  Result Date: 09/07/2019 CLINICAL DATA:  Patient found down. EXAM: CT HEAD WITHOUT CONTRAST CT CERVICAL SPINE WITHOUT CONTRAST TECHNIQUE: Multidetector CT imaging of the head and cervical spine was performed following the standard protocol without intravenous contrast. Multiplanar CT image reconstructions of the cervical spine were also generated. COMPARISON:  None. FINDINGS: CT HEAD FINDINGS Brain: No subdural, epidural, or subarachnoid hemorrhage. Ventricles and sulci are prominent. Cerebellum, brainstem, and basal cisterns are normal. No mass effect or midline shift. Mild white matter changes. No acute cortical ischemia identified. Encephalomalacia in the left occipital lobe is consistent with nonacute infarct. No other infarcts are identified. Vascular: Calcified atherosclerosis in the intracranial carotids. Skull: Normal. Negative for fracture or focal lesion. Sinuses/Orbits: Opacification of scattered ethmoid air cells is  nonspecific given intubation. Mild mucosal thickening in the maxillary sinuses. Mastoid air cells and middle ears are  well aerated. Other: None. CT CERVICAL SPINE FINDINGS Alignment: Normal. Skull base and vertebrae: No acute fracture. No primary bone lesion or focal pathologic process. Soft tissues and spinal canal: See the CT of the neck for full evaluation of the soft tissues. ET and NG tubes are identified. Disc levels:  Multilevel degenerative disc disease. Upper chest: Severe emphysematous changes are seen in the apices. Scarring in the apices, right greater than left. See the report from the CT angiogram of the chest from today for full discussion of the lungs. Other: No other abnormalities IMPRESSION: 1. No acute intracranial abnormalities. 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 White matter changes. 2. No fracture or traumatic malalignment in the cervical spine. 3. See the CT angiogram of the chest for full discussion of the chest findings. Electronically Signed   By: Dorise Bullion III M.D   On: 08/28/2019 06:26   CT Soft Tissue Neck W Contrast  Result Date: 09/06/2019 CLINICAL DATA:  Initial evaluation for acute epiglottitis or tonsillitis. EXAM: CT NECK WITH CONTRAST TECHNIQUE: Multidetector CT imaging of the neck was performed using the standard protocol following the bolus administration of intravenous contrast. CONTRAST:  151mL OMNIPAQUE IOHEXOL 350 MG/ML SOLN COMPARISON:  None. FINDINGS: Pharynx and larynx: Endotracheal and enteric tubes in place, markedly limiting assessment. Visualized oral cavity within normal limits. Palatine tonsils symmetric without obvious hypertrophy or inflammatory changes. No discrete tonsillar or peritonsillar collections. Parapharyngeal fat maintained. Adenoidal soft tissues within normal limits. Retained fluid within the nasopharynx related intubation. Trace retropharyngeal effusion, possibly related intubation. Epiglottis, vallecula, and aryepiglottic folds are effaced by the endotracheal tube. No obvious mucosal edema or swelling within the supraglottic larynx. Glottis  grossly symmetric and normal. Endotracheal tube terminates above the carina. Enteric tube courses into the visualized esophagus. Salivary glands: Salivary glands including the parotid and submandibular glands within normal limits. Thyroid: Thyroid within normal limits. Lymph nodes: No pathologically enlarged lymph nodes seen within the neck. Vascular: Normal intravascular enhancement seen throughout the neck. Atheromatous plaque at the left carotid bifurcation with associated mild stenosis. Limited intracranial: Unremarkable. Visualized orbits: Not included on this exam. Mastoids and visualized paranasal sinuses: Mild mucoperiosteal thickening noted within the maxillary sinuses. Visualized paranasal sinuses are otherwise clear. Visualize mastoids and middle ear cavities are largely clear and well pneumatized. Skeleton: No visible acute osseous abnormality. No discrete or worrisome osseous lesions. Mild cervical spondylosis noted at C5-6 and C6-7. Poor dentition noted. Upper chest: Better evaluated on concomitant CT of the chest. Advanced emphysematous changes noted. Irregular pleuroparenchymal scarring noted at the right lung apex. Other: None. IMPRESSION: 1. Endotracheal and enteric tubes in place, markedly limiting assessment. 2. No other acute abnormality identified within the neck. No significant inflammatory changes identified. 3. Emphysema (ICD10-J43.9). Electronically Signed   By: Jeannine Boga M.D.   On: 08/14/2019 06:18   CT Angio Chest PE W and/or Wo Contrast  Result Date: 09/01/2019 CLINICAL DATA:  Poly trauma.  Patient found on toilet unresponsive. EXAM: CT ANGIOGRAPHY CHEST WITH CONTRAST TECHNIQUE: Multidetector CT imaging of the chest was performed using the standard protocol during bolus administration of intravenous contrast. Multiplanar CT image reconstructions and MIPs were obtained to evaluate the vascular anatomy. CONTRAST:  170mL OMNIPAQUE IOHEXOL 350 MG/ML SOLN COMPARISON:  CT of the  chest August 16, 2017 FINDINGS: Cardiovascular: Atherosclerotic changes are seen in the thoracic aorta. No aneurysm or dissection. There are  coronary artery calcifications in the LAD. The heart size is is unremarkable. No pulmonary emboli are identified. Mediastinum/Nodes: The thyroid and esophagus are unremarkable. An NG tube is identified. No adenopathy. No effusions. Lungs/Pleura: Severe emphysematous changes are identified. Focal opacity peripherally in the right apex is again identified and unchanged in size. The previously identified cavitation has nearly resolved. This stability suggest a benign process. A left lower lobe nodule measures 9 mm today and in March of 2019. No new or enlarging nodules are identified. No pneumothorax. An ET tube is identified. Airways are otherwise normal. Upper Abdomen: No acute abnormality. Musculoskeletal: Deformity of the anterior left second, third, and fourth ribs, new since 2019. Deformity of the second, third, fourth, and fifth anterior right ribs are new as well. Anterior wedging of midthoracic vertebral bodies are stable. Scalloping of the superior endplate of a lower thoracic vertebral body is stable. Skin scalping of the superior endplate of R51 is new and there is a step-off anteriorly. Review of the MIP images confirms the above findings. IMPRESSION: 1. No pulmonary emboli are identified. 2. Deformity of anterior ribs bilaterally not seen in 2019 or fractures. While the finding is age indeterminate on today's study, the history of CPR in the interval development suggests they may be acute. 3. Scalloping of the superior endplate of O84 is new since 2019 with a step-off anteriorly, consistent with a compression fracture. I suspect this finding may be acute or subacute. 4. The nodular density in the right apex is stable with less cavitation. Stability for 2 years suggests a benign etiology. A left lower lobe nodule stable as well. 5. Atherosclerotic changes in the  nonaneurysmal aorta without dissection. 6. Severe emphysematous changes in the lungs. Aortic Atherosclerosis (ICD10-I70.0) and Emphysema (ICD10-J43.9). Sign Electronically Signed   By: Dorise Bullion III M.D   On: 08/21/2019 06:47   DG Pelvis Portable  Result Date: 08/30/2019 CLINICAL DATA:  ETT EXAM: PORTABLE PELVIS 1-2 VIEWS COMPARISON:  None. FINDINGS: The osseous structures appear diffusely demineralized which may limit detection of small or nondisplaced fractures. Bones of the pelvis appear intact and congruent. Proximal femoral a appear normally located and intact. Multilevel degenerative changes are present in the imaged portions of the spine. Additional mild degenerative changes in both SI joints and hips. Vascular calcium noted in the pelvis. Numerous air-filled, nondistended loops of small bowel are seen in the mid abdomen, nonspecific. IMPRESSION: Diffuse bony demineralization. Bones of the pelvis of the pelvis appear intact. Mild degenerative changes in the spine, hips and pelvis. Numerous non distended air-filled loops of bowel, such finding could be seen in the setting of air insufflation during resuscitative efforts. Electronically Signed   By: Lovena Le M.D.   On: 08/17/2019 03:50   CT C-SPINE NO CHARGE  Result Date: 09/07/2019 CLINICAL DATA:  Patient found down. EXAM: CT HEAD WITHOUT CONTRAST CT CERVICAL SPINE WITHOUT CONTRAST TECHNIQUE: Multidetector CT imaging of the head and cervical spine was performed following the standard protocol without intravenous contrast. Multiplanar CT image reconstructions of the cervical spine were also generated. COMPARISON:  None. FINDINGS: CT HEAD FINDINGS Brain: No subdural, epidural, or subarachnoid hemorrhage. Ventricles and sulci are prominent. Cerebellum, brainstem, and basal cisterns are normal. No mass effect or midline shift. Mild white matter changes. No acute cortical ischemia identified. Encephalomalacia in the left occipital lobe is  consistent with nonacute infarct. No other infarcts are identified. Vascular: Calcified atherosclerosis in the intracranial carotids. Skull: Normal. Negative for fracture or focal lesion. Sinuses/Orbits: Opacification  of scattered ethmoid air cells is nonspecific given intubation. Mild mucosal thickening in the maxillary sinuses. Mastoid air cells and middle ears are well aerated. Other: None. CT CERVICAL SPINE FINDINGS Alignment: Normal. Skull base and vertebrae: No acute fracture. No primary bone lesion or focal pathologic process. Soft tissues and spinal canal: See the CT of the neck for full evaluation of the soft tissues. ET and NG tubes are identified. Disc levels:  Multilevel degenerative disc disease. Upper chest: Severe emphysematous changes are seen in the apices. Scarring in the apices, right greater than left. See the report from the CT angiogram of the chest from today for full discussion of the lungs. Other: No other abnormalities IMPRESSION: 1. No acute intracranial abnormalities. 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 White matter changes. 2. No fracture or traumatic malalignment in the cervical spine. 3. See the CT angiogram of the chest for full discussion of the chest findings. Electronically Signed   By: Dorise Bullion III M.D   On: 08/25/2019 06:26   DG Chest Portable 1 View  Result Date: 08/26/2019 CLINICAL DATA:  Patient found unresponsive on the toilet, at neck, down for 10 minutes prior CPR, [place] following 5 minutes of CPR EXAM: PORTABLE CHEST 1 VIEW COMPARISON:  Radiograph 11/11/2018 FINDINGS: *Endotracheal tube terminates in the mid trachea, 4.2 cm from the carina. *Transesophageal tube tip is positioned below the level of the GE junction. *Pacer pads overlie the chest. *Telemetry leads overlie the chest. Coarse reticular opacities in the lungs are similar to prior and compatible with patient's known emphysematous changes and chronic bronchitic features. There is some slight  patchy opacity in the periphery of the left lung and in adjacent left eighth rib fracture which may be related to resuscitative efforts. Additional age indeterminate right rib seventh fracture is noted. No visible pneumothorax or effusion the portion of the left costophrenic sulcus is collimated from view. Remaining osseous structures and soft tissues are unremarkable. Please note, osseous structures appear diffusely demineralized which may limit detection of small or nondisplaced fractures. IMPRESSION: 1. Support devices in appropriate position, as detailed above. 2. Coarse reticular opacities in the lungs compatible with patient's known emphysematous changes and chronic bronchitic features. 3. Patchy opacity in the periphery of the left lung with adjacent left eighth rib fracture, could reflect some pulmonary contusion in the setting of resuscitation. Alternatively, some underlying infection or inflammation is possible. 4. Additional age-indeterminate right seventh rib fracture. Electronically Signed   By: Lovena Le M.D.   On: 08/20/2019 03:48    Procedures Procedure Name: Intubation Date/Time: 09/11/2019 3:40 AM Performed by: Ezequiel Essex, MD Pre-anesthesia Checklist: Patient identified, Patient being monitored, Emergency Drugs available, Timeout performed and Suction available Oxygen Delivery Method: Non-rebreather mask Preoxygenation: Pre-oxygenation with 100% oxygen Induction Type: Rapid sequence Ventilation: Mask ventilation without difficulty, Mask ventilation with difficulty, Two handed mask ventilation required and Nasal airway inserted- appropriate to patient size Laryngoscope Size: Glidescope and 3 Grade View: Grade III Tube size: 7.0 mm Number of attempts: 2 Airway Equipment and Method: Video-laryngoscopy,  Stylet and Rigid stylet Placement Confirmation: ETT inserted through vocal cords under direct vision,  CO2 detector and Breath sounds checked- equal and bilateral Secured at:  24 cm Tube secured with: ETT holder Dental Injury: Teeth and Oropharynx as per pre-operative assessment  Difficulty Due To: Difficulty was anticipated, Difficult Airway- due to cervical collar, Difficult Airway- due to reduced neck mobility, Difficult Airway-due to vocal cord/laryngeal edema, Difficult Airway-  due to edematous airway and  Difficult Airway- due to anterior larynx Future Recommendations: Recommend- induction with short-acting agent, and alternative techniques readily available    .Critical Care Performed by: Ezequiel Essex, MD Authorized by: Ezequiel Essex, MD   Critical care provider statement:    Critical care time (minutes):  75   Critical care was necessary to treat or prevent imminent or life-threatening deterioration of the following conditions:  Respiratory failure and cardiac failure   Critical care was time spent personally by me on the following activities:  Discussions with consultants, evaluation of patient's response to treatment, examination of patient, ordering and performing treatments and interventions, ordering and review of laboratory studies, ordering and review of radiographic studies, pulse oximetry, re-evaluation of patient's condition, obtaining history from patient or surrogate and review of old charts   (including critical care time)  Medications Ordered in ED Medications  fentaNYL (SUBLIMAZE) injection 100 mcg (has no administration in time range)  fentaNYL (SUBLIMAZE) injection 100 mcg (has no administration in time range)  midazolam (VERSED) injection 2 mg (has no administration in time range)  midazolam (VERSED) injection 2 mg (has no administration in time range)  methylPREDNISolone sodium succinate (SOLU-MEDROL) 125 mg/2 mL injection 125 mg (has no administration in time range)  magnesium sulfate IVPB 2 g 50 mL (has no administration in time range)  sodium chloride 0.9 % bolus 1,000 mL (1,000 mLs Intravenous New Bag/Given 09/09/2019 0319)    0.9 %  sodium chloride infusion ( Intravenous Stopped 08/31/2019 0328)  etomidate (AMIDATE) injection (20 mg Intravenous Given 08/21/2019 0304)  rocuronium (ZEMURON) injection (100 mg Intravenous Given 09/01/2019 0307)  methylPREDNISolone sodium succinate (SOLU-MEDROL) 125 mg/2 mL injection 125 mg (125 mg Intravenous Given 09/12/2019 0327)    ED Course  I have reviewed the triage vital signs and the nursing notes.  Pertinent labs & imaging results that were available during my care of the patient were reviewed by me and considered in my medical decision making (see chart for details).    MDM Rules/Calculators/A&P                     Post arrest here unresponsive.  Tachycardic on arrival and unresponsive.  EKG without acute ST elevation. Equal breath sounds with bagging.  Intubation attempted.  Vocal cords very swollen.  Able to pass 7.0 tube.  Blood in airway from previous intubation attempts by EMS. Wheezing on exam.  Patient given steroids as well as bronchodilators and magnesium.  Chest x-ray without acute pneumothorax.  Obtain CT head and C-spine as well as CT soft tissue neck given her vocal cord edema.   Patient's daughter Darlene [name] has arrived.  She is a Marine scientist at [employer].  She states patient has an advanced directive and believes she is DNR but does not have the paperwork. She states to continue efforts as we are but does not want CPR if her heart stops again.  pH 7.097, PCO2 71.5, PO2 greater than 650, bicarb 22 FiO2 reduced and RR increased.  CT head and C-spine are negative for acute findings.  Patient remains minimally responsive.  Daughter at bedside.  CT angiogram negative for pulmonary embolism.  Does show multiple rib fractures likely from CPR.  Admission discussed with critical care team and Dr. Chase Caller.  Patient initiated with cooling measures. Daughter updated at bedside.  She agrees no escalation of care.  Darlene [name] was evaluated in Emergency  Department on 09/03/2019 for the symptoms described in the history of present illness. She was evaluated  in the context of the global COVID-19 pandemic, which necessitated consideration that the patient might be at risk for infection with the SARS-CoV-2 virus that causes COVID-19. Institutional protocols and algorithms that pertain to the evaluation of patients at risk for COVID-19 are in a state of rapid change based on information released by regulatory bodies including the CDC and federal and state organizations. These policies and algorithms were followed during the patient's care in the ED.  Final Clinical Impression(s) / ED Diagnoses Final diagnoses:  Cardiac arrest [facility])    Rx / DC Orders ED Discharge Orders    None       Maveric Debono, Annie Main, MD 08/17/2019 [phone]

## 2019-09-02 NOTE — ED Triage Notes (Signed)
Patient with [facility] call, she was found on toilet, unresponsive and apneic.  Patient was down approx 10 mins before CPR started.  Patient had 5 mins of CPR and ROSC obtained. Patient given 5mg  midazolam due to trying to bite tube during attempted intubation.  During attempted intubations by EMS, it was noted that tissue in airway was very swollen with redness.  CBG of 190.  Rhonchi in lower lobes.

## 2019-09-02 NOTE — Progress Notes (Signed)
ABG results on ISTAT at 0444 and reported to MD rancour for post intubation  PH: 7.09 PC02: 71.5 P02: >650 HC03: 22.0

## 2019-09-02 NOTE — Progress Notes (Signed)
Chaplain offered spiritual support, including prayer to pt's dau who is bedside.  Dau has husband and 66 year old at home.  She said pt (her mother) had been sick all her life.  Dau is teary.  Please contact for ongoing support as needed or requested.   Minus [place], [place] [phone]       09/07/2019 0400  Clinical Encounter Type  Visited With Patient and family together  Visit Type Spiritual support;Initial;Critical Care  Referral From Nurse  Consult/Referral To Chaplain  Spiritual Encounters  Spiritual Needs Prayer  Stress Factors  Patient Stress Factors Health changes  Family Stress Factors Lack of knowledge

## 2019-09-02 NOTE — ED Notes (Signed)
Patient's daughter at bedside.

## 2019-09-02 NOTE — ED Notes (Signed)
Patient transported to CT with this RN and RRT.

## 2019-09-03 DIAGNOSIS — Z515 Encounter for palliative care: Secondary | ICD-10-CM

## 2019-09-03 DIAGNOSIS — Z7189 Other specified counseling: Secondary | ICD-10-CM

## 2019-09-03 MED ORDER — IPRATROPIUM-ALBUTEROL 0.5-2.5 (3) MG/3ML IN SOLN
3.0000 mL | RESPIRATORY_TRACT | Status: DC
  Administered 2019-09-03 (×4): 3 mL via RESPIRATORY_TRACT
  Filled 2019-09-03 (×4): qty 3

## 2019-09-03 MED ORDER — LORAZEPAM 1 MG PO TABS: 1.0000 mg | ORAL_TABLET | ORAL | Status: DC | PRN

## 2019-09-03 MED ORDER — MORPHINE SULFATE (CONCENTRATE) 10 MG/0.5ML PO SOLN: 5.0000 mg | ORAL | Status: DC | PRN

## 2019-09-03 MED ORDER — LORAZEPAM 2 MG/ML IJ SOLN: 1.0000 mg | INTRAMUSCULAR | Status: DC | PRN

## 2019-09-03 MED ORDER — MORPHINE SULFATE (PF) 2 MG/ML IV SOLN
2.0000 mg | INTRAVENOUS | Status: DC
  Administered 2019-09-03 – 2019-09-04 (×4): 2 mg via INTRAVENOUS
  Filled 2019-09-03 (×4): qty 1

## 2019-09-03 MED ORDER — PREDNISONE 20 MG PO TABS
20.0000 mg | ORAL_TABLET | Freq: Every day | ORAL | Status: DC
  Administered 2019-09-03 – 2019-09-06 (×4): 20 mg via ORAL
  Filled 2019-09-03 (×4): qty 1

## 2019-09-03 MED ORDER — LORAZEPAM 2 MG/ML PO CONC: 1.0000 mg | ORAL | Status: DC | PRN

## 2019-09-03 NOTE — Consult Note (Signed)
Consultation Note Date: 09/03/2019   Patient Name: Darlene [name]  DOB: 06/25/1955  MRN: [id number]  Age / Sex: 64 y.o., female  PCP: [name], MD Referring Physician: Marshell Garfinkel, MD  Reason for Consultation: Hospice Evaluation  HPI/Patient Profile: 64 y.o. female  with past medical history of COPD Gold Stage III, tobacco use, recurrent pneumothorax admitted on 08/15/2019 from home after hitting life alert call button. She was found unresponsive and apneic in bathroom and had 5-10 min CPR before ROSC. She has since been extubated with goal of comfort care. Palliative care consulted to assist with potential transition to hospice care.   Clinical Assessment and Goals of Care: I have reviewed records and note plans for comfort focused care at this time. I went to Darlene [name]'s bedside where she was resting with her daughter, Darlene [name], at bedside. Darlene [name] is able to awaken and speak to me but is very lethargic and her eyes kept closing. They tell me that she had a very difficult night and was awake with uncontrolled pain (likely from CPR with underlying chronic pain) and unable to sleep until early this morning once pain became more controlled. I note that she is using some accessory muscles to breathe but is not tachypneic. We discussed the use of morphine for pain and that this is very helpful for breathing difficulty as well. Darlene [name] asks for pain medication now - I let the RN know.   I explained that I would look and adjust medication so that she will hopefully have a more restful night. I asked them if anyone had discussed with them "where to go from here" and they both say no. Darlene [name] does tell me "I just want her to be comfortable." For now Darlene [name] is too lethargic to discuss. We discussed plan to better manage symptoms and then we can discuss further tomorrow based on how she is doing  and feeling.   Darlene [name] is so very frail and weak. I feel at this time she would be a good candidate for hospice facility. I think she could go home with hospice if she has someone who could be with her to help her as she is in a very weakened state. I did not discuss this today and will plan to discuss more tomorrow based on progress overnight.   All questions/concerns addressed. Emotional support provided. Discussed plan with Mendel Ryder, RN.   Primary Decision Maker PATIENT    SUMMARY OF RECOMMENDATIONS   - Comfort care  Code Status/Advance Care Planning:  DNR   Symptom Management:   I have added some scheduled morphine in addition to as needed to ensure better rest and comfort overnight.   As needed Ativan for anxiety/sleep relief.   Palliative Prophylaxis:   Aspiration, Bowel Regimen, Delirium Protocol, Frequent Pain Assessment, Oral Care and Turn Reposition  Additional Recommendations (Limitations, Scope, Preferences):  Full Comfort Care  Psycho-social/Spiritual:   Desire for further Chaplaincy support:yes  Additional Recommendations: Caregiving  Support/Resources, Education on Hospice and [employer]  Prognosis:   Overall prognosis is poor. Would likely be eligible for hospice facility.   Discharge Planning: To Be Determined      Primary Diagnoses: Present on Admission: . Cardiac arrest ([place])   I have reviewed the medical record, interviewed the patient and family, and examined the patient. The following aspects are pertinent.  Past Medical History:  Diagnosis Date  . Anxiety   . COPD (chronic obstructive pulmonary disease) ([place])   . Emphysema   . Substance abuse ([place])    tobacco dependance   Social History   Socioeconomic History  . Marital status: Widowed    Spouse name: Not on file  . Number of children: 2  . Years of education: Not on file  . Highest education level: Not on file  Occupational History  . Occupation: Passenger transport manager: [employer]  Tobacco Use  . Smoking status: Current Every Day Smoker    Packs/day: 0.50    Years: 42.00    Pack years: 21.00    Types: Cigarettes  . Smokeless tobacco: Never Used  . Tobacco comment: quit Jan 2019 for 15 days and has started back up  Substance and Sexual Activity  . Alcohol use: Yes    Alcohol/week: 5.0 standard drinks    Types: 5 Cans of beer per week    Comment: 3-5 cans three to four timesa week  . Drug use: No  . Sexual activity: Not Currently  Other Topics Concern  . Not on file  Social History Narrative  . Not on file   Social Determinants of Health   Financial Resource Strain:   . Difficulty of Paying Living Expenses:   Food Insecurity:   . Worried About Charity fundraiser in the Last Year:   . Arboriculturist in the Last Year:   Transportation Needs:   . Film/video editor (Medical):   Marland Kitchen Lack of Transportation (Non-Medical):   Physical Activity:   . Days of Exercise per Week:   . Minutes of Exercise per Session:   Stress:   . Feeling of Stress :   Social Connections:   . Frequency of Communication with Friends and Family:   . Frequency of Social Gatherings with Friends and Family:   . Attends Religious Services:   . Active Member of Clubs or Organizations:   . Attends Archivist Meetings:   Marland Kitchen Marital Status:    Family History  Problem Relation Age of Onset  . Alzheimer's disease Mother   . Cancer Paternal Aunt        colon   Scheduled Meds: . ipratropium-albuterol  3 mL Nebulization Q4H  . predniSONE  20 mg Oral Q breakfast   Continuous Infusions: . dextrose     PRN Meds:.acetaminophen **OR** acetaminophen, diphenhydrAMINE, fentaNYL (SUBLIMAZE) injection, fentaNYL (SUBLIMAZE) injection, glycopyrrolate **OR** glycopyrrolate **OR** glycopyrrolate, midazolam, morphine injection, polyvinyl alcohol No Known Allergies Review of Systems  Constitutional: Positive for activity change.    Respiratory: Positive for shortness of breath.   Cardiovascular: Positive for chest pain.       Secondary to CPR    Physical Exam Vitals and nursing note reviewed.  Constitutional:      General: She is not in acute distress.    Appearance: She is cachectic.     Comments: Appears frail and fatigued  Cardiovascular:     Rate and Rhythm: Tachycardia present.  Pulmonary:     Effort: Accessory muscle usage present. No tachypnea  or respiratory distress.  Abdominal:     General: Abdomen is flat.  Neurological:     Mental Status: She is alert.     Comments: Sleepy - difficulty staying awake to talk     Vital Signs: BP (!) 145/93   Pulse (!) 147   Temp (!) 95.7 F (35.4 C) (Rectal)   Resp (!) 21   Ht 5' (1.524 m)   Wt 40.8 kg   SpO2 90%   BMI 17.58 kg/m  Pain Scale: 0-10   Pain Score: 2    SpO2: SpO2: 90 % O2 Device:SpO2: 90 % O2 Flow Rate: .   IO: Intake/output summary:   Intake/Output Summary (Last 24 hours) at 09/03/2019 1513 Last data filed at 09/03/2019 0010 Gross per 24 hour  Intake --  Output 100 ml  Net -100 ml    LBM:   Baseline Weight: Weight: 40.8 kg Most recent weight: Weight: 40.8 kg     Palliative Assessment/Data:     Time In: 1525 Time Out: 1605 Time Total: 40 min Greater than 50%  of this time was spent counseling and coordinating care related to the above assessment and plan.  Signed by: Vinie Sill, NP Palliative Medicine Team Pager # [phone] (M-F 8a-5p) Team Phone # [phone] (Nights/Weekends)

## 2019-09-03 NOTE — Progress Notes (Signed)
   NAME:  Darlene [name], MRN:  [id number], DOB:  1956-04-18, LOS: 1 ADMISSION DATE:  09/12/2019, CONSULTATION DATE:  09/03/19 REFERRING MD: Rancour  , CHIEF COMPLAINT:  Cardiac arrest   Brief History   64 y.o. F with PMH of COPD Gold Stage III, tobacco use, recurrent pneumothorax who was brought in by EMS after life alert call.  Pt found unresponsive on the toilet and apneic.  Rosc after 5-10 minutes CPR and has been unresponsive PCCM consulted for admission.  Patient was previously DNR.  After discussion with family patient was transitioned to comfort care and underwent terminal extubation on 3/21.  Past Medical History   has a past medical history of Anxiety, COPD (chronic obstructive pulmonary disease) ([place]), Emphysema, and Substance abuse ([place]).   Significant Hospital Events   3/21 admit to PCCM, one way extubation  Consults:    Procedures:  3/21 ET tube  Significant Diagnostic Tests:  3/21 CXR>>Patchy opacity in the periphery of the left lung with adjacent left eighth rib fracture, could reflect some pulmonary contusion in the setting of resuscitation.  Micro Data:  3/21 respiratory viral panel>> pending 3/21 blood cultures x2.>>  Antimicrobials:    Interim history/subjective:  More awake today, interactive.  Want something to eat Complains of thirst  Objective   Blood pressure (!) 145/93, pulse (!) 147, temperature (!) 95.7 F (35.4 C), temperature source Rectal, resp. rate (!) 21, height 5' (1.524 m), weight 40.8 kg, SpO2 90 %.        Intake/Output Summary (Last 24 hours) at 09/03/2019 0825 Last data filed at 09/03/2019 0010 Gross per 24 hour  Intake --  Output 100 ml  Net -100 ml   Filed Weights   09/06/2019 0310  Weight: 40.8 kg   Gen:      Thin, frail HEENT:  EOMI, sclera anicteric Neck:     No masses; no thyromegaly Lungs:    Expiratory wheeze CV:         Regular rate and rhythm; no murmurs Abd:      + bowel sounds; soft, non-tender; no palpable  masses, no distension Ext:    No edema; adequate peripheral perfusion Skin:      Warm and dry; no rash Neuro: Awake, alert.  No focal deficits  Resolved Hospital Problem list     Assessment & Plan:   Acute cardiopulmonary arrest in setting of severe COPD with exacerbation, failure to thrive, malnutrition As per patient's living will, DNR status and previously expressed wishes to the family she has been transition to comfort measures. She is awake now with increased work of breathing and wheezing.  Continue morphine as needed, fentanyl, Versed-titrated to comfort We will give her nebulizers and a short course of prednisone.  These meds are appropriate as it will relieve her dyspnea. Palliative care has been consulted.  Likely home with hospice Patient and daughter updated at bedside.  Marshell Garfinkel MD [facility] Please see [email or web address] for pager details.  09/03/2019, 8:29 AM

## 2019-09-03 NOTE — Progress Notes (Signed)
Patient is very congested and is unable to move her secretions, sent message to provider to add on Sodium Cl 3% PRN.

## 2019-09-03 NOTE — Progress Notes (Signed)
Patient is on comfort care. She is sating 78%. The patient is extremely congested and unable to move her secretions on her own, very weak.   I sent a message to the provider for Sodium Cl 3%. The but the patient hurts all over and does not want to be touched. I am going to discontinue her Duoneb and the Sodium Cl if ordered, it is not helping her at this time, she is barely inhaling the medication which is not helping her.

## 2019-09-04 DIAGNOSIS — L8990 Pressure ulcer of unspecified site, unspecified stage: Secondary | ICD-10-CM | POA: Insufficient documentation

## 2019-09-04 MED ORDER — GLYCOPYRROLATE 0.2 MG/ML IJ SOLN
0.4000 mg | Freq: Four times a day (QID) | INTRAMUSCULAR | Status: DC
  Administered 2019-09-04 – 2019-09-07 (×16): 0.4 mg via INTRAVENOUS
  Filled 2019-09-04 (×17): qty 2

## 2019-09-04 MED ORDER — IPRATROPIUM-ALBUTEROL 0.5-2.5 (3) MG/3ML IN SOLN
3.0000 mL | Freq: Three times a day (TID) | RESPIRATORY_TRACT | Status: DC
  Administered 2019-09-05 – 2019-09-07 (×7): 3 mL via RESPIRATORY_TRACT
  Filled 2019-09-04 (×10): qty 3

## 2019-09-04 MED ORDER — MORPHINE SULFATE ER 15 MG PO TBCR
15.0000 mg | EXTENDED_RELEASE_TABLET | Freq: Two times a day (BID) | ORAL | Status: DC
  Administered 2019-09-04 – 2019-09-05 (×4): 15 mg via ORAL
  Filled 2019-09-04 (×4): qty 1

## 2019-09-04 MED ORDER — MORPHINE SULFATE (PF) 4 MG/ML IV SOLN
4.0000 mg | INTRAVENOUS | Status: AC | PRN
  Administered 2019-09-05 – 2019-09-06 (×3): 4 mg via INTRAVENOUS
  Filled 2019-09-04 (×3): qty 1

## 2019-09-04 MED ORDER — SODIUM CHLORIDE 3 % IN NEBU
4.0000 mL | INHALATION_SOLUTION | RESPIRATORY_TRACT | Status: AC | PRN
  Filled 2019-09-04: qty 4

## 2019-09-04 MED ORDER — MORPHINE SULFATE (PF) 4 MG/ML IV SOLN
4.0000 mg | INTRAVENOUS | Status: AC
  Administered 2019-09-04 (×2): 4 mg via INTRAVENOUS
  Filled 2019-09-04 (×2): qty 1

## 2019-09-04 MED ORDER — IPRATROPIUM-ALBUTEROL 0.5-2.5 (3) MG/3ML IN SOLN
3.0000 mL | RESPIRATORY_TRACT | Status: DC
  Administered 2019-09-04 (×2): 3 mL via RESPIRATORY_TRACT
  Filled 2019-09-04 (×4): qty 3

## 2019-09-04 MED ORDER — MORPHINE SULFATE ER 15 MG PO TBCR: 15.0000 mg | EXTENDED_RELEASE_TABLET | Freq: Two times a day (BID) | ORAL | Status: DC

## 2019-09-04 MED ORDER — LIDOCAINE 5 % EX PTCH
1.0000 | MEDICATED_PATCH | CUTANEOUS | Status: DC
  Administered 2019-09-04 – 2019-09-06 (×3): 1 via TRANSDERMAL
  Filled 2019-09-04 (×4): qty 1

## 2019-09-04 NOTE — Progress Notes (Signed)
   NAME:  Darlene [name], MRN:  [id number], DOB:  [birth year], LOS: 2 ADMISSION DATE:  08/28/2019, CONSULTATION DATE:  09/04/19 REFERRING MD: Rancour  , CHIEF COMPLAINT:  Cardiac arrest   Brief History   64 y.o. F with PMH of COPD Gold Stage III, tobacco use, recurrent pneumothorax who was brought in by EMS after life alert call.  Pt found unresponsive on the toilet and apneic.  Rosc after 5-10 minutes CPR and has been unresponsive PCCM consulted for admission.  Patient was previously DNR.  After discussion with family patient was transitioned to comfort care and underwent terminal extubation on 3/21.  Past Medical History   has a past medical history of Anxiety, COPD (chronic obstructive pulmonary disease) ([place]), Emphysema, and Substance abuse ([place]).   Significant Hospital Events   3/21 admit to PCCM, one way extubation  Consults:    Procedures:  3/21 ET tube  Significant Diagnostic Tests:  3/21 CXR>>Patchy opacity in the periphery of the left lung with adjacent left eighth rib fracture, could reflect some pulmonary contusion in the setting of resuscitation.  Micro Data:  3/21 respiratory viral panel>> negative  Antimicrobials:    Interim history/subjective:  No acute events.  Complains of chest pain. Denies any dyspnea  Objective   Blood pressure (!) 137/91, pulse (!) 153, temperature 98.2 F (36.8 C), temperature source Oral, resp. rate 16, height 5' (1.524 m), weight 40.8 kg, SpO2 (!) 84 %.        Intake/Output Summary (Last 24 hours) at 09/04/2019 0928 Last data filed at 09/03/2019 1845 Gross per 24 hour  Intake 450 ml  Output 50 ml  Net 400 ml   Filed Weights   08/19/2019 0310  Weight: 40.8 kg   Gen:      Thin, frail HEENT:  EOMI, sclera anicteric Neck:     No masses; no thyromegaly Lungs:    Scattered rhonchi. CV:         Regular rate and rhythm; no murmurs Abd:      + bowel sounds; soft, non-tender; no palpable masses, no distension Ext:    No edema;  adequate peripheral perfusion Skin:      Warm and dry; no rash Neuro: Awake, alert.  No focal deficits   Resolved Hospital Problem list     Assessment & Plan:   Acute cardiopulmonary arrest in setting of severe COPD with exacerbation, failure to thrive, malnutrition As per patient's living will, DNR status and previously expressed wishes to the family she has been transition to comfort measures.  Continue morphine as needed, fentanyl, Versed-titrated to comfort Continue with nebs, short course of prednisone.  These meds are appropriate as it will relieve her dyspnea. Nebulized saline as needed for ciliary clearance Appreciate input from palliative care with ongoing discussions for either hospice facility or home with hospice.  Transfer to [facility] service starting 3/24 for further management.  Marshell Garfinkel MD [facility] Please see [email or web address] for pager details.  09/04/2019, 9:28 AM

## 2019-09-04 NOTE — Progress Notes (Signed)
Nutrition Brief Note  Chart reviewed. Pt now transitioning to comfort care.  No further nutrition interventions warranted at this time.  Please re-consult as needed.   Maizy Davanzo W, RD, LDN, CDCES Registered Dietitian II Certified Diabetes Care and Education Specialist Please refer to AMION for RD and/or RD on-call/weekend/after hours pager  

## 2019-09-04 NOTE — Progress Notes (Addendum)
Daily Progress Note   Patient Name: Darlene [name]       Date: 09/04/2019 DOB: January 28, 1956  Age: 64 y.o. MRN#: [id number] Attending Physician: Marshell Garfinkel, MD Primary Care Physician: [name], MD Admit Date: 08/18/2019  Reason for Consultation/Follow-up: Establishing goals of care and Pain control  Subjective: Touched base with Darlene [name] nurse. Patient seemed to have a better night last night. Did not want anything to eat or drink today. Darlene [name] was more alert when I went to speak with her today. She tells me her back is still quite painful, and she currently only gets 30 minutes of relief with medications. She denied any dyspnea or other complaints. She does not have any appetite today, but said she would maybe feel like eating tomorrow.   Clinical Assessment and GOC: Darlene [name] did not recall much of the visit that took place yesterday with Elmo Putt and her daughter, [name], at bedside. I explained that we were trying to figure out our next steps from here which she admitted she hadn't really thought about. She asked me directly, "do you think I'm dying?" I told her I did not think she was dying here in the hospital, but was honest in saying that we were approaching her end of life given her overall clinical picture. I mentioned one option would be residential hospice vs home with hospice services. She requested time to think things over and also requested a visit from the chaplain for further support.  I told her our main goal for today would be to get her back pain under better control which she was grateful for.   [name] arrived shortly after our visit so I returned to update her. She requested to speak with me privately in the hall. She confesses she is in a difficult  situation because she and her brother are essentially the only family she has that can help and they are 1-2 hours away. She gives further insight into why her mom feels "we are rushing things." They went through a similar process with the patient's husband. [name] tells me Darlene [name] saw sending him to hospice as "giving up on him." [name] says there is no way her mother can safely return home, even with support from home hospice. [name] feels she is burdened with the same difficult decisions she  had to make with her dad. I empathized on how challenging this can be and that we would continue having discussions regarding next steps as we work to get her mom's pain under better control.     Patient Profile/HPI: 64 y.o. female  with past medical history of COPD Gold Stage III, tobacco use, recurrent pneumothorax admitted on 08/19/2019 from home after hitting life alert call button. She was found unresponsive and apneic in bathroom and had 5-10 min CPR before ROSC. She has since been extubated with goal of comfort care. Palliative care consulted to assist with potential transition to hospice care.    Length of Stay: 2  Current Medications: Scheduled Meds:  . glycopyrrolate  0.4 mg Intravenous QID  . ipratropium-albuterol  3 mL Nebulization Q4H  . lidocaine  1 patch Transdermal Q24H  . morphine  15 mg Oral Q12H  .  morphine injection  4 mg Intravenous Q4H  . predniSONE  20 mg Oral Q breakfast    Continuous Infusions:   PRN Meds: acetaminophen **OR** acetaminophen, diphenhydrAMINE, LORazepam **OR** LORazepam **OR** LORazepam, morphine injection, polyvinyl alcohol, sodium chloride HYPERTONIC  General: awake, alert, frail  Pulm: normal work of breathing with scattered rhonchi CV: RRR  Vital Signs: BP (!) 137/91 (BP Location: Right Arm)   Pulse (!) 153   Temp 98.2 F (36.8 C) (Oral)   Resp 16   Ht 5' (1.524 m)   Wt 40.8 kg   SpO2 (!) 84%   BMI 17.58 kg/m  SpO2: SpO2: (!) 84 % O2  Device: O2 Device: Nasal Cannula O2 Flow Rate: O2 Flow Rate (L/min): 3 L/min  Intake/output summary:   Intake/Output Summary (Last 24 hours) at 09/04/2019 1052 Last data filed at 09/04/2019 0900 Gross per 24 hour  Intake 300 ml  Output 50 ml  Net 250 ml   LBM: Last BM Date: (PTA) Baseline Weight: Weight: 40.8 kg Most recent weight: Weight: 40.8 kg       Palliative Assessment/Data: 30%      Patient Active Problem List   Diagnosis Date Noted  . Pressure injury of skin 09/04/2019  . Goals of care, counseling/discussion   . Palliative care encounter   . Cardiac arrest ([place]) 08/18/2019  . Chronic respiratory failure with hypoxia and hypercapnia ([place]) 03/23/2018  . DOE (dyspnea on exertion) 03/22/2018  . Chest pain, musculoskeletal 12/19/2017  . Chronic respiratory failure with hypoxia ([facility])/ noct hypoxemia 10/30/2017  . Recurrent spontaneous pneumothorax 07/18/2017  . Nodule of apex of right lung 06/18/2017  . Spontaneous pneumothorax on L 06/17/17 06/17/2017  . Acute respiratory failure with hypoxia ([place]) 06/17/2017  . Anxiety disorder 06/17/2017  . Tobacco abuse 06/17/2017  . Severe protein-calorie malnutrition ([place]) 06/17/2017  . 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 Osteoporosis 06/17/2017  . COPD with emphysema ([place]) 06/17/2017  . Anxiety   . COPD (chronic obstructive pulmonary disease) ([place])   . Substance abuse ([place])   . Pulmonary nodules 07/10/2014  . Cough 03/17/2013  . Cigarette smoker 12/30/2012  . COPD GOLD III  12/29/2012    Palliative Care Plan    Recommendations/Plan:  Will start MS Contin 15 mg BID with prn morphine for breakthrough in an effort for better pain control  Continue ongoing discussion with patient and daughter regarding disposition; would qualify for residential hospice but patient and daughter need more time to discuss   Goals of Care and Additional Recommendations:  Limitations on Scope of Treatment: Full Comfort Care   Code Status:  DNR  Prognosis:   < 2 weeks   Discharge Planning:  To Be Determined  Care plan was discussed with Dr. Vaughan Browner   Thank you for allowing the Palliative Medicine Team to assist in the care of this patient.  Total time spent: 60 minutes      Greater than 50%  of this time was spent counseling and coordinating care related to the above assessment and plan.  Modena Nunnery, DO PGY-2 Palliative Medicine  Please contact Palliative MedicineTeam phone at [phone] for questions and concerns between 7 am - 7 pm.   Please see AMION for individual provider pager numbers.  I have discussed recommendations and plans including medication changes to better ensure comfort with Dr. Koleen Distance.   Vinie Sill, NP Palliative Medicine Team Pager [phone] (Please see [email or web address] for schedule) Team Phone [phone]    Greater than 50%  of this time was spent counseling and coordinating care related to the above assessment and plan

## 2019-09-05 ENCOUNTER — Other Ambulatory Visit: Payer: Self-pay | Admitting: Internal Medicine

## 2019-09-05 MED ORDER — LORAZEPAM 0.5 MG PO TABS
0.2500 mg | ORAL_TABLET | Freq: Every day | ORAL | Status: DC
  Administered 2019-09-06: 0.25 mg via ORAL
  Filled 2019-09-05: qty 1

## 2019-09-05 MED ORDER — LORAZEPAM 0.5 MG PO TABS
0.5000 mg | ORAL_TABLET | Freq: Every day | ORAL | Status: DC
  Administered 2019-09-05: 0.5 mg via ORAL
  Filled 2019-09-05: qty 1

## 2019-09-05 MED ORDER — GUAIFENESIN 100 MG/5ML PO SOLN
15.0000 mL | Freq: Three times a day (TID) | ORAL | Status: DC
  Administered 2019-09-05 – 2019-09-06 (×6): 300 mg via ORAL
  Filled 2019-09-05 (×9): qty 15

## 2019-09-05 NOTE — Progress Notes (Signed)
Palliative:  HPI: 64 y.o. female  with past medical history of COPD Gold Stage III, tobacco use, recurrent pneumothorax admitted on 08/20/2019 from home after hitting life alert call button. She was found unresponsive and apneic in bathroom and had 5-10 min CPR before ROSC. She has since been extubated with goal of comfort care. Palliative care consulted to assist with comfort care and potential for hospice.   I met today with Darlene [name]. She is awake and alert but appears frail and fatigued. Still with some back pain but this is chronic and unlikely to go away completely - she understands. She is very pleasant and appreciative of the conversation.   She reflects on her life and some regrets of decisions she has made. We discussed that we are all human and all make bad decisions. She feels that she is in this condition "because I have done this to myself." I explained that I have met many wonderful people with horrible illnesses and I have also met horrible people that always seem to improve and be fine. I encouraged her that we all have our journeys and try and do the best we can. I explained that I have not been convinced in this job that bad things happen to bad people or good things happen to good people - this has not been my experience. She agrees.   We discussed how to move forward and she understands that she is very ill and that her time is likely limited. She would like to return home and does not understand that she is very weak and this is unlikely. She shares with me about her husband being at hospice facility and although she believes he was well cared for this brings up bad memories for her. This makes it very difficult for her to consider hospice care.   I called and spoke with daughter, Darlene [name]. Darlene [name] explains that it is very difficult for her mother to be at this stage of her life. Darlene [name] says that she had a very difficult time when her husband was in hospice. Darlene [name] knows that she  wants to return home but this is just not very practical. We discussed that Ms. Zanetti is not recalling information from day to day and is having periods of confusion fluctuating with periods of lucidity. Darlene [name] is concerned that her mother is calling and expressing distressing thoughts sometimes - I will schedule some anxiety medication to assist and help with nighttime rest as well. We discussed that this is a difficult situation and Darlene [name] does not want to force her mother into a situation she does not want to be in. We decided to continue to support and adjust medication for comfort. With some more time she will likely declare herself and this will work itself out.   I received call from RN that son had questions. I called him at number listed in chart but unable to reach. Attempted to call room but he had left. Attempted to call Darlene [name] to confirm her brother's contact but did not reach her. Updated RN who spoke with son and that he felt better that we had spoken with his sister and RN encouraged him to speak with his sister.   All questions/concerns addressed. Emotional support provided.   Exam: Alert, oriented but forgetful. Breathing slightly labored. Fatigued. Productive cough with concern for aspiration but difficulty to expectorate. Thin, frail, weak.   Plan: - Comfort care.  - Continue Robinul and adding guaifenesin for wet cough.  - Scheduled  Ativan small am dose and larger pm dose to assist with sleep and anxiety.  - Patient not open to hospice, does not have the enough support to return home, and not eligible for rehab. Disposition remains unclear. I do feel that she is likely to have further decline especially with cough and aspiration risk and this will become more clear in the near future.   Equ[employer], NP Palliative Medicine Team Pager [phone] (Please see [email or web address] for schedule) Team Phone [phone]    Greater than 50%  of this time was spent counseling  and coordinating care related to the above assessment and plan

## 2019-09-05 NOTE — Progress Notes (Signed)
PROGRESS NOTE    Darlene [name]  YIR:[id number] DOB: 1956/03/16 DOA: 09/06/2019 PCP: [name], MD   Brief Narrative: 64 year old female with COPD Gold stage III, tobacco use, recurrent pneumothorax brought by EMS after life alert call.  Patient was found unresponsive on the toilet and apneic ROSC after 5 to 10 minutes of CPR and unresponsive, patient was intubated, she was seen in the ER and was subsequently admitted to critical care.  Subsequently patient was transitioned to DNR and after discussion with the family transition to comfort care and underwent terminal extubation 3/21. Patient was transferred to [facility] service 3/24, palliative care was consulted.  Subjective: Seen and examined- C/o shortness of breath. On Cassandra o2 at 2.5 l. Ill, frail, coughing. Wheezing Appears alert awake, conversant.   Assessment & Plan:  Acute cardiopulmonary arrest in the setting of severe COPD with exacerbation: Patient was terminally extubated in the ICU and transition to comfort measures DNR.  Continue supportive care.  Patient continues to be dyspneic at rest.  Continue prednisone p.o., lidocaine patch, robitussin. nebulizer.  Acute respiratory failure chronic respiratory failure/ acute severe COPD with exacerbation: Continue supportive care as above.  Leucocytosis Lactic acidisos Polycythemia in the setting of COPD Hypokalemia Elevated LFTs- AST. Poor nutritional status with BMI Body mass index is 17.58 kg/m./Failure to thrive  Plan: ontinue on palliative care comfort measures pain medication short course of steroids bronchodilators. Patient's husband died in hospice care and she is not very open to going hospice house, it appears she does not have good social support at home lives alone does have family members living nearby Will continue on palliative care discussion and ordered for further plan  Nutrition: Diet Order            Diet clear liquid Room service appropriate? Yes;  Fluid consistency: Thin  Diet effective now             Pressure Ulcer: Pressure Injury 09/09/2019 Other (Comment) Left Stage 1 -  Intact skin with non-blanchable redness of a localized area usually over a bony prominence. (Active)  09/04/2019 0914  Location: Other (Comment) (Left Scapula)  Location Orientation: Left  Staging: Stage 1 -  Intact skin with non-blanchable redness of a localized area usually over a bony prominence.  Wound Description (Comments):   Present on Admission: Yes    DVT prophylaxis:SCD Code Status:DNR  Family Communication: plan of care discussed with patient at bedside.  Discussed with the palliative care who will meet with the family. Disposition Plan: Patient is from:home Anticipated Disposition:TBD Barriers to discharge or conditions that needs to be met prior to discharge: Patient admitted following cardiac pulmonary arrest in the setting of severe COPD at home, was intubated and admitted to ICU and subsequently extubated terminally 3/21 and transitionED to comfort measures AND to [facility] service 3/24-awaiting for further plan of care with hospice at home versus facility.  Consultants: Palliative care, PCCM   Procedures: 3/21 CXR>>Patchy opacity in the periphery of the left lung with adjacent left eighth rib fracture, could reflect some pulmonary contusion in the setting of resuscitation.  Microbiology:see note   Medications: Scheduled Meds: . glycopyrrolate  0.4 mg Intravenous QID  . ipratropium-albuterol  3 mL Nebulization TID  . lidocaine  1 patch Transdermal Q24H  . morphine  15 mg Oral Q12H  . predniSONE  20 mg Oral Q breakfast   Continuous Infusions:  Antimicrobials: Anti-infectives (From admission, onward)   None       Objective: Vitals: Today's Vitals  09/05/19 0426 09/05/19 0456 09/05/19 0750 09/05/19 0811  BP:   120/88   Pulse:   (!) 149   Resp:      Temp:   98.6 F (37 C)   TempSrc:   Oral   SpO2:   94%   Weight:        Height:      PainSc: 6  Asleep  0-No pain    Intake/Output Summary (Last 24 hours) at 09/05/2019 1052 Last data filed at 09/04/2019 1514 Gross per 24 hour  Intake 60 ml  Output --  Net 60 ml   Filed Weights   08/19/2019 0310  Weight: 40.8 kg   Weight change:    Intake/Output from previous day: 03/23 0701 - 03/24 0700 In: 160 [P.O.:160] Out: -  Intake/Output this shift: No intake/output data recorded.  Examination:  General exam: AA, thin frail, on nasal cannula. HEENT:Oral mucosa moist, Ear/Nose WNL grossly,dentition normal. Respiratory system: bilaterally diminished with wheezing. Cardiovascular system: S1 & S2 +, regular, No JVD. Gastrointestinal system: Abdomen soft, NT,ND, BS+. Nervous System:Alert, awake, moving extremities and grossly nonfocal Extremities: No edema, distal peripheral pulses palpable.  Skin: No rashes,no icterus. MSK: Normal muscle bulk,tone, power  Data Reviewed: I have personally reviewed following labs and imaging studies CBC: Recent Labs  Lab 08/24/2019 0313 08/28/2019 0344 09/04/2019 0616  WBC 15.3*  --  13.2*  NEUTROABS 11.6*  --   --   HGB 13.4 14.3 16.0*  HCT 41.9 42.0 49.2*  MCV 117.7*  --  112.8*  PLT 171  --  831   Basic Metabolic Panel: Recent Labs  Lab 09/10/2019 0313 08/25/2019 0344 08/16/2019 0616  NA 140 138 136  K 3.7 3.6 3.1*  CL 109 109 105  CO2 17*  --  18*  GLUCOSE 186* 171* 242*  BUN 7* 8 6*  CREATININE 0.49 0.60 0.41*  CALCIUM 7.7*  --  7.5*   GFR: Estimated Creatinine Clearance: 45.8 mL/min (A) (by C-G formula based on SCr of 0.41 mg/dL (L)). Liver Function Tests: Recent Labs  Lab 08/23/2019 0313  AST 198*  ALT 39  ALKPHOS 91  BILITOT 0.5  PROT 4.9*  ALBUMIN 2.5*   No results for input(s): LIPASE, AMYLASE in the last 168 hours. No results for input(s): AMMONIA in the last 168 hours. Coagulation Profile: Recent Labs  Lab 08/28/2019 0616  INR 1.0   Cardiac Enzymes: No results for input(s): CKTOTAL, CKMB,  CKMBINDEX, TROPONINI in the last 168 hours. BNP (last 3 results) No results for input(s): PROBNP in the last 8760 hours. HbA1C: No results for input(s): HGBA1C in the last 72 hours. CBG: Recent Labs  Lab 08/22/2019 0308 08/16/2019 0808  GLUCAP 174* 241*   Lipid Profile: No results for input(s): CHOL, HDL, LDLCALC, TRIG, CHOLHDL, LDLDIRECT in the last 72 hours. Thyroid Function Tests: No results for input(s): TSH, T4TOTAL, FREET4, T3FREE, THYROIDAB in the last 72 hours. Anemia Panel: No results for input(s): VITAMINB12, FOLATE, FERRITIN, TIBC, IRON, RETICCTPCT in the last 72 hours. Sepsis Labs: Recent Labs  Lab 08/13/2019 0616  LATICACIDVEN 2.8*    Recent Results (from the past 240 hour(s))  Respiratory Panel by RT PCR (Flu A&B, Covid) - Nasopharyngeal Swab     Status: None   Collection Time: 09/06/2019  5:40 AM   Specimen: Nasopharyngeal Swab  Result Value Ref Range Status   SARS Coronavirus 2 by RT PCR NEGATIVE NEGATIVE Final    Comment: (NOTE) SARS-CoV-2 target nucleic acids are NOT DETECTED. The  SARS-CoV-2 RNA is generally detectable in upper respiratoy specimens during the acute phase of infection. The lowest concentration of SARS-CoV-2 viral copies this assay can detect is 131 copies/mL. A negative result does not preclude SARS-Cov-2 infection and should not be used as the sole basis for treatment or other patient management decisions. A negative result may occur with  improper specimen collection/handling, submission of specimen other than nasopharyngeal swab, presence of viral mutation(s) within the areas targeted by this assay, and inadequate number of viral copies (<131 copies/mL). A negative result must be combined with clinical observations, patient history, and epidemiological information. The expected result is Negative. Fact Sheet for Patients:  PinkCheek.be Fact Sheet for Healthcare Providers:    GravelBags.it This test is not yet ap proved or cleared by the [place] FDA and  has been authorized for detection and/or diagnosis of SARS-CoV-2 by FDA under an Emergency Use Authorization (EUA). This EUA will remain  in effect (meaning this test can be used) for the duration of the COVID-19 declaration under Section 564(b)(1) of the Act, 21 U.S.C. section 360bbb-3(b)(1), unless the authorization is terminated or revoked sooner.    Influenza A by PCR NEGATIVE NEGATIVE Final   Influenza B by PCR NEGATIVE NEGATIVE Final    Comment: (NOTE) The Xpert Xpress SARS-CoV-2/FLU/RSV assay is intended as an aid in  the diagnosis of influenza from Nasopharyngeal swab specimens and  should not be used as a sole basis for treatment. Nasal washings and  aspirates are unacceptable for Xpert Xpress SARS-CoV-2/FLU/RSV  testing. Fact Sheet for Patients: PinkCheek.be Fact Sheet for Healthcare Providers: GravelBags.it This test is not yet approved or cleared by the [place] FDA and  has been authorized for detection and/or diagnosis of SARS-CoV-2 by  FDA under an Emergency Use Authorization (EUA). This EUA will remain  in effect (meaning this test can be used) for the duration of the  Covid-19 declaration under Section 564(b)(1) of the Act, 21  U.S.C. section 360bbb-3(b)(1), unless the authorization is  terminated or revoked. Performed at [facility], [place] [address] [place], [place] [address]   Blood culture (routine x 2)     Status: None (Preliminary result)   Collection Time: 08/23/2019  6:16 AM   Specimen: BLOOD  Result Value Ref Range Status   Specimen Description BLOOD RIGHT ANTECUBITAL  Final   Special Requests   Final    BOTTLES DRAWN AEROBIC AND ANAEROBIC Blood Culture adequate volume   Culture   Final    NO GROWTH 3 DAYS Performed at [facility], [place] [address], [place],  [place] [address]    Report Status PENDING  Incomplete  Blood culture (routine x 2)     Status: None (Preliminary result)   Collection Time: 09/10/2019  6:21 AM   Specimen: BLOOD LEFT ARM  Result Value Ref Range Status   Specimen Description BLOOD LEFT ARM  Final   Special Requests   Final    BOTTLES DRAWN AEROBIC ONLY Blood Culture adequate volume   Culture   Final    NO GROWTH 3 DAYS Performed at [facility], [address], [place], [place] [address]    Report Status PENDING  Incomplete  MRSA PCR Screening     Status: None   Collection Time: 08/13/2019  8:09 AM   Specimen: Nasal Mucosa; Nasopharyngeal  Result Value Ref Range Status   MRSA by PCR NEGATIVE NEGATIVE Final    Comment:  The GeneXpert MRSA Assay (FDA approved for NASAL specimens only), is one component of a comprehensive MRSA colonization surveillance program. It is not intended to diagnose MRSA infection nor to guide or monitor treatment for MRSA infections. Performed at [facility], [place] [address], [place],  [address]       Radiology Studies: No results found.   LOS: 3 days   Time spent: More than 50% of that time was spent in counseling and/or coordination of care.  Antonieta Pert, MD [facility]  09/05/2019, 10:52 AM

## 2019-09-06 DIAGNOSIS — J9621 Acute and chronic respiratory failure with hypoxia: Principal | ICD-10-CM

## 2019-09-06 MED ORDER — LORAZEPAM 2 MG/ML IJ SOLN: 1.0000 mg | INTRAMUSCULAR | Status: DC | PRN

## 2019-09-06 MED ORDER — LORAZEPAM 2 MG/ML IJ SOLN
2.0000 mg | Freq: Three times a day (TID) | INTRAMUSCULAR | Status: DC
  Administered 2019-09-06 – 2019-09-07 (×4): 2 mg via INTRAVENOUS
  Filled 2019-09-06 (×4): qty 1

## 2019-09-06 MED ORDER — HALOPERIDOL LACTATE 5 MG/ML IJ SOLN: 2.0000 mg | Freq: Four times a day (QID) | INTRAMUSCULAR | Status: DC | PRN

## 2019-09-06 MED ORDER — MORPHINE SULFATE ER 15 MG PO TBCR
30.0000 mg | EXTENDED_RELEASE_TABLET | Freq: Two times a day (BID) | ORAL | Status: DC
  Administered 2019-09-06: 30 mg via ORAL
  Filled 2019-09-06: qty 2

## 2019-09-06 MED ORDER — MORPHINE SULFATE (PF) 4 MG/ML IV SOLN
6.0000 mg | INTRAVENOUS | Status: AC
  Administered 2019-09-06 – 2019-09-07 (×7): 6 mg via INTRAVENOUS
  Filled 2019-09-06 (×7): qty 2

## 2019-09-06 MED ORDER — LORAZEPAM 2 MG/ML IJ SOLN: 2.0000 mg | INTRAMUSCULAR | Status: DC | PRN

## 2019-09-06 NOTE — Progress Notes (Signed)
Palliative Medicine RN Note: Symptom check. Patient's daughter Darlene [name] is at bedside.   Upon arrival, RR over 30 with very labored breathing. Patient's MS ER was increased this morning, but it hasn't been long enough for her to feel the effects. She agreed to take some IV morphine this am around 0800 per the [facility]. She doesn't like to take it because it burns in her IV. She does feel bad enough now that she will take a dose of IV morphine. The expectation is that the morphine will allow her to rest from her labored breathing, and she will likely be able to sleep. Her daughter stated that, although she is grateful for the time to be able to talk to her mom, she would rather her be asleep and comfortable.   Darlene [name] fell asleep during my visit and was unable to remember earlier parts of our conversation. Darlene [name] asked that we change the IV morphine to scheduled. Her mother has been afraid of "getting addicted" and has historically refused medication because of this. [name] has always avoided "being a burden" or "bothering people," and Darlene [name] doesn't want her mother to suffer because of this. Darlene [name] has to work a night shift tonight and wants to make sure her mom stays comfortable.  I update PMT NP Darlene [name]; she will adjust Darlene [name]'s medication regimen.   [name], RN, BSN, [facility] Palliative Medicine Team 09/06/2019 12:41 PM Office [phone]

## 2019-09-06 NOTE — Plan of Care (Signed)
  Problem: Clinical Measurements: Goal: Will remain free from infection Outcome: Progressing Goal: Respiratory complications will improve Outcome: Not Progressing

## 2019-09-06 NOTE — Progress Notes (Signed)
PROGRESS NOTE    Darlene [name]  CZY:[id number] DOB: [birth year] DOA: 08/25/2019 PCP: [name], MD   Brief Narrative: 64 year old female with COPD Gold stage III, tobacco use, recurrent pneumothorax brought by EMS after life alert call.  Patient was found unresponsive on the toilet and apneic ROSC after 5 to 10 minutes of CPR and unresponsive, patient was intubated, she was seen in the ER and was subsequently admitted to critical care.  Subsequently patient was transitioned to DNR and after discussion with the family transition to comfort care and underwent terminal extubation 3/21. Patient was transferred to [facility] service 3/24, palliative care was consulted.  Subjective: Seen and examined. Daughter at the bedside, patient appears frail ill anxious, confused. Daughter helping patient take few sips and patient starts coughing some after drinking. Has been tachycardic and overnight with labored breathing  Assessment & Plan:  Acute cardiopulmonary arrest in the setting of severe COPD with exacerbation: Patient was terminally extubated in the ICU and transition to comfort measures DNR.  Continue supportive care.  Patient continues to be dyspneic at rest, intermittently coughing, continue on supportive measures with focus on comfort.   Acute hypoxic respiratory failure chronic respiratory failure/ acute severe COPD with exacerbation: Continue supportive care as above.  Acute metabolic encephalopathy patient intermittently confused suspect in the setting of #1  Tachycardia Leucocytosis Lactic acidisos Polycythemia in the setting of COPD Hypokalemia Elevated LFTs- AST. Poor nutritional status with BMI Body mass index is 17.58 kg/m./Failure to thrive  Plan: Overall prognosis poor, patient is hesitant on going to hospice facility.  We will continue on current comfort measures, appreciate palliative care input, continue Robinul, antitussives, Ativan, opiates for pain control and  antianxiety. Appreciate palliative care ongoing discussed with patient's family. Daughter at bedside updated  Nutrition: Diet Order            Diet clear liquid Room service appropriate? Yes; Fluid consistency: Thin  Diet effective now             Pressure Ulcer: Pressure Injury 08/16/2019 Other (Comment) Left Stage 1 -  Intact skin with non-blanchable redness of a localized area usually over a bony prominence. (Active)  09/04/2019 0914  Location: Other (Comment) (Left Scapula)  Location Orientation: Left  Staging: Stage 1 -  Intact skin with non-blanchable redness of a localized area usually over a bony prominence.  Wound Description (Comments):   Present on Admission: Yes    DVT prophylaxis:SCD Code Status:DNR  Family Communication: plan of care discussed with patient at bedside.  Discussed with the palliative care who will meet with the family. Disposition Plan: Patient is from:home Anticipated Disposition:TBD Barriers to discharge or conditions that needs to be met prior to discharge: Patient admitted following cardiac pulmonary arrest in the setting of severe COPD at home, was intubated and admitted to ICU and subsequently extubated terminally 3/21 and transitioned to comfort measures to [facility] service 3/24, ongoing discussions with palliative care, focusing on comfort measures, patient reluctant to regarding hospice facility.  Consultants: Palliative care, PCCM   Procedures: 3/21 CXR>>Patchy opacity in the periphery of the left lung with adjacent left eighth rib fracture, could reflect some pulmonary contusion in the setting of resuscitation.  Microbiology:see note   Medications: Scheduled Meds: . glycopyrrolate  0.4 mg Intravenous QID  . guaiFENesin  15 mL Oral TID  . ipratropium-albuterol  3 mL Nebulization TID  . lidocaine  1 patch Transdermal Q24H  .  morphine injection  6 mg Intravenous Q4H   Continuous  Infusions:  Antimicrobials: Anti-infectives (From  admission, onward)   None       Objective: Vitals: Today's Vitals   09/05/19 2100 09/06/19 0300 09/06/19 0700 09/06/19 0813  BP:  130/90    Pulse:  (!) 142 (!) 161   Resp:      Temp:  98.2 F (36.8 C) 100.2 F (37.9 C)   TempSrc:  Oral Oral   SpO2:  95%    Weight:      Height:      PainSc: 0-No pain   9    No intake or output data in the 24 hours ending 09/06/19 1127 Filed Weights   08/27/2019 0310  Weight: 40.8 kg   Weight change:    Intake/Output from previous day: No intake/output data recorded. Intake/Output this shift: No intake/output data recorded.  Examination:  General exam: Confused, lethargic frail, anxious ill looking.  HEENT:Oral mucosa moist, Ear/Nose WNL grossly,dentition normal. Respiratory system: bilaterally diminished breath sound with wheezing. Cardiovascular system: S1 & S2 +, regular, No JVD. Gastrointestinal system: Abdomen soft, NT,ND, BS+. Nervous System: Frail lethargic, confused Extremities: No edema, distal peripheral pulses palpable.  Skin: No rashes,no icterus. MSK: Normal muscle bulk,tone, power  Data Reviewed: I have personally reviewed following labs and imaging studies CBC: Recent Labs  Lab 09/09/2019 0313 08/15/2019 0344 08/25/2019 0616  WBC 15.3*  --  13.2*  NEUTROABS 11.6*  --   --   HGB 13.4 14.3 16.0*  HCT 41.9 42.0 49.2*  MCV 117.7*  --  112.8*  PLT 171  --  867   Basic Metabolic Panel: Recent Labs  Lab 09/06/2019 0313 09/05/2019 0344 08/30/2019 0616  NA 140 138 136  K 3.7 3.6 3.1*  CL 109 109 105  CO2 17*  --  18*  GLUCOSE 186* 171* 242*  BUN 7* 8 6*  CREATININE 0.49 0.60 0.41*  CALCIUM 7.7*  --  7.5*   GFR: Estimated Creatinine Clearance: 45.8 mL/min (A) (by C-G formula based on SCr of 0.41 mg/dL (L)). Liver Function Tests: Recent Labs  Lab 08/23/2019 0313  AST 198*  ALT 39  ALKPHOS 91  BILITOT 0.5  PROT 4.9*  ALBUMIN 2.5*   No results for input(s): LIPASE, AMYLASE in the last 168 hours. No results for  input(s): AMMONIA in the last 168 hours. Coagulation Profile: Recent Labs  Lab 09/12/2019 0616  INR 1.0   Cardiac Enzymes: No results for input(s): CKTOTAL, CKMB, CKMBINDEX, TROPONINI in the last 168 hours. BNP (last 3 results) No results for input(s): PROBNP in the last 8760 hours. HbA1C: No results for input(s): HGBA1C in the last 72 hours. CBG: Recent Labs  Lab 08/24/2019 0308 08/20/2019 0808  GLUCAP 174* 241*   Lipid Profile: No results for input(s): CHOL, HDL, LDLCALC, TRIG, CHOLHDL, LDLDIRECT in the last 72 hours. Thyroid Function Tests: No results for input(s): TSH, T4TOTAL, FREET4, T3FREE, THYROIDAB in the last 72 hours. Anemia Panel: No results for input(s): VITAMINB12, FOLATE, FERRITIN, TIBC, IRON, RETICCTPCT in the last 72 hours. Sepsis Labs: Recent Labs  Lab 09/07/2019 0616  LATICACIDVEN 2.8*    Recent Results (from the past 240 hour(s))  Respiratory Panel by RT PCR (Flu A&B, Covid) - Nasopharyngeal Swab     Status: None   Collection Time: 08/13/2019  5:40 AM   Specimen: Nasopharyngeal Swab  Result Value Ref Range Status   SARS Coronavirus 2 by RT PCR NEGATIVE NEGATIVE Final    Comment: (NOTE) SARS-CoV-2 target nucleic acids are NOT DETECTED. The SARS-CoV-2 RNA  is generally detectable in upper respiratoy specimens during the acute phase of infection. The lowest concentration of SARS-CoV-2 viral copies this assay can detect is 131 copies/mL. A negative result does not preclude SARS-Cov-2 infection and should not be used as the sole basis for treatment or other patient management decisions. A negative result may occur with  improper specimen collection/handling, submission of specimen other than nasopharyngeal swab, presence of viral mutation(s) within the areas targeted by this assay, and inadequate number of viral copies (<131 copies/mL). A negative result must be combined with clinical observations, patient history, and epidemiological information. The expected  result is Negative. Fact Sheet for Patients:  PinkCheek.be Fact Sheet for Healthcare Providers:  GravelBags.it This test is not yet ap proved or cleared by the [place] FDA and  has been authorized for detection and/or diagnosis of SARS-CoV-2 by FDA under an Emergency Use Authorization (EUA). This EUA will remain  in effect (meaning this test can be used) for the duration of the COVID-19 declaration under Section 564(b)(1) of the Act, 21 U.S.C. section 360bbb-3(b)(1), unless the authorization is terminated or revoked sooner.    Influenza A by PCR NEGATIVE NEGATIVE Final   Influenza B by PCR NEGATIVE NEGATIVE Final    Comment: (NOTE) The Xpert Xpress SARS-CoV-2/FLU/RSV assay is intended as an aid in  the diagnosis of influenza from Nasopharyngeal swab specimens and  should not be used as a sole basis for treatment. Nasal washings and  aspirates are unacceptable for Xpert Xpress SARS-CoV-2/FLU/RSV  testing. Fact Sheet for Patients: PinkCheek.be Fact Sheet for Healthcare Providers: GravelBags.it This test is not yet approved or cleared by the [place] FDA and  has been authorized for detection and/or diagnosis of SARS-CoV-2 by  FDA under an Emergency Use Authorization (EUA). This EUA will remain  in effect (meaning this test can be used) for the duration of the  Covid-19 declaration under Section 564(b)(1) of the Act, 21  U.S.C. section 360bbb-3(b)(1), unless the authorization is  terminated or revoked. Performed at [facility], [place] [address] [place], [place] [address]   Blood culture (routine x 2)     Status: None (Preliminary result)   Collection Time: 09/10/2019  6:16 AM   Specimen: BLOOD  Result Value Ref Range Status   Specimen Description BLOOD RIGHT ANTECUBITAL  Final   Special Requests   Final    BOTTLES DRAWN AEROBIC AND ANAEROBIC Blood  Culture adequate volume   Culture   Final    NO GROWTH 4 DAYS Performed at [facility], [place] [address] [place], [place] [address]    Report Status PENDING  Incomplete  Blood culture (routine x 2)     Status: None (Preliminary result)   Collection Time: 09/01/2019  6:21 AM   Specimen: BLOOD LEFT ARM  Result Value Ref Range Status   Specimen Description BLOOD LEFT ARM  Final   Special Requests   Final    BOTTLES DRAWN AEROBIC ONLY Blood Culture adequate volume   Culture   Final    NO GROWTH 4 DAYS Performed at [facility], [place] [address], [place], [place] [address]    Report Status PENDING  Incomplete  MRSA PCR Screening     Status: None   Collection Time: 09/01/2019  8:09 AM   Specimen: Nasal Mucosa; Nasopharyngeal  Result Value Ref Range Status   MRSA by PCR NEGATIVE NEGATIVE Final    Comment:        The GeneXpert MRSA  Assay (FDA approved for NASAL specimens only), is one component of a comprehensive MRSA colonization surveillance program. It is not intended to diagnose MRSA infection nor to guide or monitor treatment for MRSA infections. Performed at [facility], [place] [address], [place], [place] [address]       Radiology Studies: No results found.   LOS: 4 days   Time spent: More than 50% of that time was spent in counseling and/or coordination of care.  Antonieta Pert, MD [facility]  09/06/2019, 11:27 AM

## 2019-09-06 NOTE — Progress Notes (Signed)
Chaplain responded to spiritual consult. Patient's nurse and daughter [name] were in the room.  Chaplain asked if the patient was interested in a visit at a later time, and patient said yes.  Will follow up later this morning. Rev. Tamsen Snider Pager [phone]

## 2019-09-06 NOTE — Progress Notes (Signed)
Chaplain followed up.  Patient then asleep. Provided support to daughter [name], who is a nurse herself. She says "I just don't want her to suffer.  She looks more comfortable now."  Chaplain offered words of support and prayer. [name] has a brother.  Father is deceased.   [name] indicated that when mother wakes up, she may wish to have time with chaplain. Please contact [facility] if EOL appears imminient. Rev. Tamsen Snider Pager [phone]

## 2019-09-06 NOTE — Progress Notes (Signed)
Palliative:  Discussed with palliative RN Darlene [name]. Darlene [name] with anxiety, shortness of breath, and more confused. Darlene [name] discussed with daughter, Darlene [name], who just wants her mother to be comfortable. Concern for aspiration and ability to continue to take po medications. I will transition to IV medications for comfort and will increase from previous doses that provided minimal relief.   - MS Contin D/C. Morphine 6 mg IV (increased from 4 mg which provided minimal relief for short period of time) every 4 hours scheduled and 4 mg IV every 30 min as needed for breakthrough pain/SOB.  - Ativan 2 mg IV every 8 hours and every hour as needed for breakthrough anxiety.  - D/C prednisone with anxiety/confusion worsening.  - Continue with goals for full comfort care.   No charge  Darlene Sill, NP Palliative Medicine Team Pager [phone] (Please see [email or web address] for schedule) Team Phone [phone]    Greater than 50%  of this time was spent counseling and coordinating care related to the above assessment and plan

## 2019-09-06 NOTE — Progress Notes (Signed)
Pt with SOB and on labored breathing. Offered to give IV morphine or PO ativan, but pt refuses. O2 at 3li, sats at 94%.

## 2019-09-07 LAB — CULTURE, BLOOD (ROUTINE X 2)
Culture: NO GROWTH
Culture: NO GROWTH
Special Requests: ADEQUATE
Special Requests: ADEQUATE

## 2019-09-07 MED ORDER — SODIUM CHLORIDE 0.9 % IV SOLN
0.5000 mg/h | INTRAVENOUS | Status: DC
  Administered 2019-09-07: 0.5 mg/h via INTRAVENOUS
  Filled 2019-09-07: qty 5

## 2019-09-07 MED ORDER — HYDROMORPHONE BOLUS VIA INFUSION
1.0000 mg | INTRAVENOUS | Status: DC | PRN
  Filled 2019-09-07: qty 1

## 2019-09-07 MED ORDER — IPRATROPIUM-ALBUTEROL 0.5-2.5 (3) MG/3ML IN SOLN: 3.0000 mL | Freq: Four times a day (QID) | RESPIRATORY_TRACT | Status: DC | PRN

## 2019-09-07 NOTE — Progress Notes (Signed)
PROGRESS NOTE    Darlene [name]  TOI:[id number] DOB: Feb 20, 1956 DOA: 08/24/2019 PCP: [name], MD   Brief Narrative: 64 year old female with COPD Gold stage III, tobacco use, recurrent pneumothorax brought by EMS after life alert call.  Patient was found unresponsive on the toilet and apneic ROSC after 5 to 10 minutes of CPR and unresponsive, patient was intubated, she was seen in the ER and was subsequently admitted to critical care.  Subsequently patient was transitioned to DNR and after discussion with the family transition to comfort care and underwent terminal extubation 3/21. Patient was transferred to [facility] service 3/24, palliative care was consulted.  Subjective: Seen and examined this morning. Patient appears comfortable, minimally responding. Tachycardic   Assessment & Plan:  Acute cardiopulmonary arrest in the setting of acute respiratory failure with severe COPD with exacerbation: Patient was terminally extubated in the ICU and transition to comfort measures DNR.  Continue supportive care.  Patient continues to be dyspneic at rest, intermittently coughing, continue on supportive measures with focus on comfort.   Acute hypoxic respiratory failure chronic respiratory failure/ acute severe COPD with exacerbation:  Acute metabolic encephalopathy -minimally responding.   Tachycardia Leucocytosis Lactic acidisos Polycythemia in the setting of COPD Hypokalemia Elevated LFTs- AST. Poor nutritional status with BMI Body mass index is 17.58 kg/m./Failure to thrive  Plan: Overall prognosis poor. Continue on current palliative measures with, patient is hesitant on going to hospice facility.  We will continue on current comfort measures with current Robinul, Ativan and morphine. She appears comfortable. Palliative care following. No family at bedside today.  Nutrition: Diet Order            Diet clear liquid Room service appropriate? Yes; Fluid consistency: Thin  Diet  effective now             Pressure Ulcer: Pressure Injury 09/07/2019 Other (Comment) Left Stage 1 -  Intact skin with non-blanchable redness of a localized area usually over a bony prominence. (Active)  08/14/2019 0914  Location: Other (Comment) (Left Scapula)  Location Orientation: Left  Staging: Stage 1 -  Intact skin with non-blanchable redness of a localized area usually over a bony prominence.  Wound Description (Comments):   Present on Admission: Yes    DVT prophylaxis:SCD Code Status:DNR  Family Communication: Discussed with daughter yesterday at bedside.  No family today.   Disposition Plan: Patient is from:home Anticipated Disposition:TBD Barriers to discharge or conditions that needs to be met prior to discharge: Patient admitted following cardiac pulmonary arrest in the setting of severe COPD/acute hypoxic respiratory failure, currently on comfort measures after extubated terminally on 3/21 and transitioned to comfort measures to [facility] service 3/24  Consultants: Palliative care, PCCM   Procedures: 3/21 CXR>>Patchy opacity in the periphery of the left lung with adjacent left eighth rib fracture, could reflect some pulmonary contusion in the setting of resuscitation.  Microbiology:see note   Medications: Scheduled Meds: . glycopyrrolate  0.4 mg Intravenous QID  . guaiFENesin  15 mL Oral TID  . ipratropium-albuterol  3 mL Nebulization TID  . lidocaine  1 patch Transdermal Q24H  . LORazepam  2 mg Intravenous Q8H  .  morphine injection  6 mg Intravenous Q4H   Continuous Infusions:  Antimicrobials: Anti-infectives (From admission, onward)   None       Objective: Vitals: Today's Vitals   09/07/19 0419 09/07/19 0808 09/07/19 0847 09/07/19 0926  BP: (!) 106/58   107/69  Pulse: (!) 136   (!) 135  Resp:  16  Temp: 98.3 F (36.8 C)   97.8 F (36.6 C)  TempSrc: Oral   Oral  SpO2: 95% 94%  91%  Weight:      Height:      PainSc:   Asleep    No intake or  output data in the 24 hours ending 09/07/19 1350 Filed Weights   09/07/2019 0310  Weight: 40.8 kg   Weight change:    Intake/Output from previous day: No intake/output data recorded. Intake/Output this shift: No intake/output data recorded.  Examination:  General exam: Comfortable, minimally responding.   HEENT:Oral mucosa moist, Ear/Nose WNL grossly,dentition normal. Respiratory system: bilaterally diminished breath sound with wheezing. Cardiovascular system: S1 & S2 +, regular, No JVD. Gastrointestinal system: Abdomen soft, NT,ND, BS+. Nervous System: Minimally responding.  Extremities: No edema, distal peripheral pulses palpable.  Skin: No rashes,no icterus. MSK: Normal muscle bulk,tone, power  Data Reviewed: I have personally reviewed following labs and imaging studies CBC: Recent Labs  Lab 09/03/2019 0313 09/01/2019 0344 08/19/2019 0616  WBC 15.3*  --  13.2*  NEUTROABS 11.6*  --   --   HGB 13.4 14.3 16.0*  HCT 41.9 42.0 49.2*  MCV 117.7*  --  112.8*  PLT 171  --  248   Basic Metabolic Panel: Recent Labs  Lab 09/05/2019 0313 08/29/2019 0344 08/14/2019 0616  NA 140 138 136  K 3.7 3.6 3.1*  CL 109 109 105  CO2 17*  --  18*  GLUCOSE 186* 171* 242*  BUN 7* 8 6*  CREATININE 0.49 0.60 0.41*  CALCIUM 7.7*  --  7.5*   GFR: Estimated Creatinine Clearance: 45.8 mL/min (A) (by C-G formula based on SCr of 0.41 mg/dL (L)). Liver Function Tests: Recent Labs  Lab 08/24/2019 0313  AST 198*  ALT 39  ALKPHOS 91  BILITOT 0.5  PROT 4.9*  ALBUMIN 2.5*   No results for input(s): LIPASE, AMYLASE in the last 168 hours. No results for input(s): AMMONIA in the last 168 hours. Coagulation Profile: Recent Labs  Lab 08/13/2019 0616  INR 1.0   Cardiac Enzymes: No results for input(s): CKTOTAL, CKMB, CKMBINDEX, TROPONINI in the last 168 hours. BNP (last 3 results) No results for input(s): PROBNP in the last 8760 hours. HbA1C: No results for input(s): HGBA1C in the last 72  hours. CBG: Recent Labs  Lab 08/15/2019 0308 08/15/2019 0808  GLUCAP 174* 241*   Lipid Profile: No results for input(s): CHOL, HDL, LDLCALC, TRIG, CHOLHDL, LDLDIRECT in the last 72 hours. Thyroid Function Tests: No results for input(s): TSH, T4TOTAL, FREET4, T3FREE, THYROIDAB in the last 72 hours. Anemia Panel: No results for input(s): VITAMINB12, FOLATE, FERRITIN, TIBC, IRON, RETICCTPCT in the last 72 hours. Sepsis Labs: Recent Labs  Lab 08/27/2019 0616  LATICACIDVEN 2.8*    Recent Results (from the past 240 hour(s))  Respiratory Panel by RT PCR (Flu A&B, Covid) - Nasopharyngeal Swab     Status: None   Collection Time: 08/16/2019  5:40 AM   Specimen: Nasopharyngeal Swab  Result Value Ref Range Status   SARS Coronavirus 2 by RT PCR NEGATIVE NEGATIVE Final    Comment: (NOTE) SARS-CoV-2 target nucleic acids are NOT DETECTED. The SARS-CoV-2 RNA is generally detectable in upper respiratoy specimens during the acute phase of infection. The lowest concentration of SARS-CoV-2 viral copies this assay can detect is 131 copies/mL. A negative result does not preclude SARS-Cov-2 infection and should not be used as the sole basis for treatment or other patient management decisions. A negative  result may occur with  improper specimen collection/handling, submission of specimen other than nasopharyngeal swab, presence of viral mutation(s) within the areas targeted by this assay, and inadequate number of viral copies (<131 copies/mL). A negative result must be combined with clinical observations, patient history, and epidemiological information. The expected result is Negative. Fact Sheet for Patients:  PinkCheek.be Fact Sheet for Healthcare Providers:  GravelBags.it This test is not yet ap proved or cleared by the [place] FDA and  has been authorized for detection and/or diagnosis of SARS-CoV-2 by FDA under an Emergency Use  Authorization (EUA). This EUA will remain  in effect (meaning this test can be used) for the duration of the COVID-19 declaration under Section 564(b)(1) of the Act, 21 U.S.C. section 360bbb-3(b)(1), unless the authorization is terminated or revoked sooner.    Influenza A by PCR NEGATIVE NEGATIVE Final   Influenza B by PCR NEGATIVE NEGATIVE Final    Comment: (NOTE) The Xpert Xpress SARS-CoV-2/FLU/RSV assay is intended as an aid in  the diagnosis of influenza from Nasopharyngeal swab specimens and  should not be used as a sole basis for treatment. Nasal washings and  aspirates are unacceptable for Xpert Xpress SARS-CoV-2/FLU/RSV  testing. Fact Sheet for Patients: PinkCheek.be Fact Sheet for Healthcare Providers: GravelBags.it This test is not yet approved or cleared by the [place] FDA and  has been authorized for detection and/or diagnosis of SARS-CoV-2 by  FDA under an Emergency Use Authorization (EUA). This EUA will remain  in effect (meaning this test can be used) for the duration of the  Covid-19 declaration under Section 564(b)(1) of the Act, 21  U.S.C. section 360bbb-3(b)(1), unless the authorization is  terminated or revoked. Performed at [facility], [place] [address], [place], [place] [address]   Blood culture (routine x 2)     Status: None   Collection Time: 08/22/2019  6:16 AM   Specimen: BLOOD  Result Value Ref Range Status   Specimen Description BLOOD RIGHT ANTECUBITAL  Final   Special Requests   Final    BOTTLES DRAWN AEROBIC AND ANAEROBIC Blood Culture adequate volume   Culture   Final    NO GROWTH 5 DAYS Performed at [facility], [place] [address], [place], [place] [address]    Report Status 09/07/2019 FINAL  Final  Blood culture (routine x 2)     Status: None   Collection Time: 08/26/2019  6:21 AM   Specimen: BLOOD LEFT ARM  Result Value Ref Range Status   Specimen Description BLOOD LEFT ARM   Final   Special Requests   Final    BOTTLES DRAWN AEROBIC ONLY Blood Culture adequate volume   Culture   Final    NO GROWTH 5 DAYS Performed at [facility], [place] [address] [place], [place] [address]    Report Status 09/07/2019 FINAL  Final  MRSA PCR Screening     Status: None   Collection Time: 08/17/2019  8:09 AM   Specimen: Nasal Mucosa; Nasopharyngeal  Result Value Ref Range Status   MRSA by PCR NEGATIVE NEGATIVE Final    Comment:        The GeneXpert MRSA Assay (FDA approved for NASAL specimens only), is one component of a comprehensive MRSA colonization surveillance program. It is not intended to diagnose MRSA infection nor to guide or monitor treatment for MRSA infections. Performed at [facility], [place] [address], [place], [place] [address]       Radiology Studies:  No results found.   LOS: 5 days   Time spent: More than 50% of that time was spent in counseling and/or coordination of care.  Antonieta Pert, MD [facility]  09/07/2019, 1:50 PM

## 2019-09-07 NOTE — Progress Notes (Signed)
Palliative Medicine RN Note: Symptom check. Spoke with RN Remo Lipps.  Patient has eyes open. She is moving her arms around aimlessly and does not respond to redirection. She is due for lorazepam. She is using accessory muscles to breathe and appears very uncomfortable. I spoke with Dr Hilma Favors. She ordered rotation to hydromorphone & initiation of continuous infusion, as well as increasing frequency of scheduled lorazepam.  PMT will continue to follow for symptoms, should she survive the night.  Darlene Skiff Oliviya Gilkison, RN, BSN, [facility] Palliative Medicine Team 09/07/2019 3:19 PM Office [phone]

## 2019-09-13 NOTE — Progress Notes (Signed)
[facility] reference number is [id number].

## 2019-09-13 NOTE — Death Summary Note (Signed)
DEATH SUMMARY   Patient Details  Name: Darlene [name] MRN: [id number] DOB: [birth year]  Admission/Discharge Information   Admit Date:  09-24-19  Date of Death: Date of Death: 09/30/2019  Time of Death: Time of Death: 0440  Length of Stay: 6  Referring Physician: [name], MD   Reason(s) for Hospitalization  Unresponsiveness  Diagnoses  Preliminary cause of death: Acute cardiopulmonary arrest in the setting of acute respiratory failure with severe COPD with exacerbation: Patient was terminally extubated in the ICU and transitioned to comfort measures DNR.  Other issues: Acute hypoxic respiratory failure chronic respiratory failure/ acute severe COPD with exacerbation:  Acute metabolic encephalopathy -minimally responding.   Tachycardia Leucocytosis Lactic acidisos Polycythemia in the setting of COPD Hypokalemia Elevated LFTs- AST. Poor nutritional status with BMI Body mass index is 17.58 kg/m./Failure to thrive  Brief Hospital Course (including significant findings, care, treatment, and services provided and events leading to death)  Darlene [name] is a 64 y.o. year old female with COPD Gold stage III, tobacco use, recurrent pneumothorax brought by EMS after life alert call.  Patient was found unresponsive on the toilet and apneic ROSC after 5 to 10 minutes of CPR and unresponsive, patient was intubated, she was seen in the ER and was subsequently admitted to critical care.  Subsequently patient was transitioned to DNR and after discussion with the family transition to comfort care and underwent terminal extubation 09-24-2022.Patient was transferred to [facility] service 3/24, palliative care was consulted Patient was seen and followed by palliative care and she peacefully passed away 09-30-2018, 04:40 am   Pertinent Labs and Studies  Significant Diagnostic Studies CT Head Wo Contrast  Result Date: 24-Sep-2019 CLINICAL DATA:  Patient found down. EXAM: CT HEAD WITHOUT CONTRAST CT  CERVICAL SPINE WITHOUT CONTRAST TECHNIQUE: Multidetector CT imaging of the head and cervical spine was performed following the standard protocol without intravenous contrast. Multiplanar CT image reconstructions of the cervical spine were also generated. COMPARISON:  None. FINDINGS: CT HEAD FINDINGS Brain: No subdural, epidural, or subarachnoid hemorrhage. Ventricles and sulci are prominent. Cerebellum, brainstem, and basal cisterns are normal. No mass effect or midline shift. Mild white matter changes. No acute cortical ischemia identified. Encephalomalacia in the left occipital lobe is consistent with nonacute infarct. No other infarcts are identified. Vascular: Calcified atherosclerosis in the intracranial carotids. Skull: Normal. Negative for fracture or focal lesion. Sinuses/Orbits: Opacification of scattered ethmoid air cells is nonspecific given intubation. Mild mucosal thickening in the maxillary sinuses. Mastoid air cells and middle ears are well aerated. Other: None. CT CERVICAL SPINE FINDINGS Alignment: Normal. Skull base and vertebrae: No acute fracture. No primary bone lesion or focal pathologic process. Soft tissues and spinal canal: See the CT of the neck for full evaluation of the soft tissues. ET and NG tubes are identified. Disc levels:  Multilevel degenerative disc disease. Upper chest: Severe emphysematous changes are seen in the apices. Scarring in the apices, right greater than left. See the report from the CT angiogram of the chest from today for full discussion of the lungs. Other: No other abnormalities IMPRESSION: 1. No acute intracranial abnormalities. 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 White matter changes. 2. No fracture or traumatic malalignment in the cervical spine. 3. See the CT angiogram of the chest for full discussion of the chest findings. Electronically Signed   By: Dorise Bullion III M.D   On: 09/24/2019 06:26   CT Soft Tissue Neck W Contrast  Result Date:  09/24/19 CLINICAL DATA:  Initial evaluation for acute epiglottitis or tonsillitis. EXAM: CT NECK WITH CONTRAST TECHNIQUE: Multidetector CT imaging of the neck was performed using the standard protocol following the bolus administration of intravenous contrast. CONTRAST:  157mL OMNIPAQUE IOHEXOL 350 MG/ML SOLN COMPARISON:  None. FINDINGS: Pharynx and larynx: Endotracheal and enteric tubes in place, markedly limiting assessment. Visualized oral cavity within normal limits. Palatine tonsils symmetric without obvious hypertrophy or inflammatory changes. No discrete tonsillar or peritonsillar collections. Parapharyngeal fat maintained. Adenoidal soft tissues within normal limits. Retained fluid within the nasopharynx related intubation. Trace retropharyngeal effusion, possibly related intubation. Epiglottis, vallecula, and aryepiglottic folds are effaced by the endotracheal tube. No obvious mucosal edema or swelling within the supraglottic larynx. Glottis grossly symmetric and normal. Endotracheal tube terminates above the carina. Enteric tube courses into the visualized esophagus. Salivary glands: Salivary glands including the parotid and submandibular glands within normal limits. Thyroid: Thyroid within normal limits. Lymph nodes: No pathologically enlarged lymph nodes seen within the neck. Vascular: Normal intravascular enhancement seen throughout the neck. Atheromatous plaque at the left carotid bifurcation with associated mild stenosis. Limited intracranial: Unremarkable. Visualized orbits: Not included on this exam. Mastoids and visualized paranasal sinuses: Mild mucoperiosteal thickening noted within the maxillary sinuses. Visualized paranasal sinuses are otherwise clear. Visualize mastoids and middle ear cavities are largely clear and well pneumatized. Skeleton: No visible acute osseous abnormality. No discrete or worrisome osseous lesions. Mild cervical spondylosis noted at C5-6 and C6-7. Poor dentition noted.  Upper chest: Better evaluated on concomitant CT of the chest. Advanced emphysematous changes noted. Irregular pleuroparenchymal scarring noted at the right lung apex. Other: None. IMPRESSION: 1. Endotracheal and enteric tubes in place, markedly limiting assessment. 2. No other acute abnormality identified within the neck. No significant inflammatory changes identified. 3. Emphysema (ICD10-J43.9). Electronically Signed   By: Jeannine Boga M.D.   On: 09/09/2019 06:18   CT Angio Chest PE W and/or Wo Contrast  Result Date: 08/24/2019 CLINICAL DATA:  Poly trauma.  Patient found on toilet unresponsive. EXAM: CT ANGIOGRAPHY CHEST WITH CONTRAST TECHNIQUE: Multidetector CT imaging of the chest was performed using the standard protocol during bolus administration of intravenous contrast. Multiplanar CT image reconstructions and MIPs were obtained to evaluate the vascular anatomy. CONTRAST:  117mL OMNIPAQUE IOHEXOL 350 MG/ML SOLN COMPARISON:  CT of the chest August 16, 2017 FINDINGS: Cardiovascular: Atherosclerotic changes are seen in the thoracic aorta. No aneurysm or dissection. There are coronary artery calcifications in the LAD. The heart size is is unremarkable. No pulmonary emboli are identified. Mediastinum/Nodes: The thyroid and esophagus are unremarkable. An NG tube is identified. No adenopathy. No effusions. Lungs/Pleura: Severe emphysematous changes are identified. Focal opacity peripherally in the right apex is again identified and unchanged in size. The previously identified cavitation has nearly resolved. This stability suggest a benign process. A left lower lobe nodule measures 9 mm today and in March of 2019. No new or enlarging nodules are identified. No pneumothorax. An ET tube is identified. Airways are otherwise normal. Upper Abdomen: No acute abnormality. Musculoskeletal: Deformity of the anterior left second, third, and fourth ribs, new since 2019. Deformity of the second, third, fourth, and  fifth anterior right ribs are new as well. Anterior wedging of midthoracic vertebral bodies are stable. Scalloping of the superior endplate of a lower thoracic vertebral body is stable. Skin scalping of the superior endplate of F62 is new and there is a step-off anteriorly. Review of the MIP images confirms the above findings. IMPRESSION: 1. No pulmonary emboli are identified.  2. Deformity of anterior ribs bilaterally not seen in 2019 or fractures. While the finding is age indeterminate on today's study, the history of CPR in the interval development suggests they may be acute. 3. Scalloping of the superior endplate of G18 is new since 2019 with a step-off anteriorly, consistent with a compression fracture. I suspect this finding may be acute or subacute. 4. The nodular density in the right apex is stable with less cavitation. Stability for 2 years suggests a benign etiology. A left lower lobe nodule stable as well. 5. Atherosclerotic changes in the nonaneurysmal aorta without dissection. 6. Severe emphysematous changes in the lungs. Aortic Atherosclerosis (ICD10-I70.0) and Emphysema (ICD10-J43.9). Sign Electronically Signed   By: Dorise Bullion III M.D   On: 08/23/2019 06:47   DG Pelvis Portable  Result Date: 08/19/2019 CLINICAL DATA:  ETT EXAM: PORTABLE PELVIS 1-2 VIEWS COMPARISON:  None. FINDINGS: The osseous structures appear diffusely demineralized which may limit detection of small or nondisplaced fractures. Bones of the pelvis appear intact and congruent. Proximal femoral a appear normally located and intact. Multilevel degenerative changes are present in the imaged portions of the spine. Additional mild degenerative changes in both SI joints and hips. Vascular calcium noted in the pelvis. Numerous air-filled, nondistended loops of small bowel are seen in the mid abdomen, nonspecific. IMPRESSION: Diffuse bony demineralization. Bones of the pelvis of the pelvis appear intact. Mild degenerative changes in  the spine, hips and pelvis. Numerous non distended air-filled loops of bowel, such finding could be seen in the setting of air insufflation during resuscitative efforts. Electronically Signed   By: Lovena Le M.D.   On: 08/29/2019 03:50   CT C-SPINE NO CHARGE  Result Date: 08/15/2019 CLINICAL DATA:  Patient found down. EXAM: CT HEAD WITHOUT CONTRAST CT CERVICAL SPINE WITHOUT CONTRAST TECHNIQUE: Multidetector CT imaging of the head and cervical spine was performed following the standard protocol without intravenous contrast. Multiplanar CT image reconstructions of the cervical spine were also generated. COMPARISON:  None. FINDINGS: CT HEAD FINDINGS Brain: No subdural, epidural, or subarachnoid hemorrhage. Ventricles and sulci are prominent. Cerebellum, brainstem, and basal cisterns are normal. No mass effect or midline shift. Mild white matter changes. No acute cortical ischemia identified. Encephalomalacia in the left occipital lobe is consistent with nonacute infarct. No other infarcts are identified. Vascular: Calcified atherosclerosis in the intracranial carotids. Skull: Normal. Negative for fracture or focal lesion. Sinuses/Orbits: Opacification of scattered ethmoid air cells is nonspecific given intubation. Mild mucosal thickening in the maxillary sinuses. Mastoid air cells and middle ears are well aerated. Other: None. CT CERVICAL SPINE FINDINGS Alignment: Normal. Skull base and vertebrae: No acute fracture. No primary bone lesion or focal pathologic process. Soft tissues and spinal canal: See the CT of the neck for full evaluation of the soft tissues. ET and NG tubes are identified. Disc levels:  Multilevel degenerative disc disease. Upper chest: Severe emphysematous changes are seen in the apices. Scarring in the apices, right greater than left. See the report from the CT angiogram of the chest from today for full discussion of the lungs. Other: No other abnormalities IMPRESSION: 1. No acute  intracranial abnormalities. 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 White matter changes. 2. No fracture or traumatic malalignment in the cervical spine. 3. See the CT angiogram of the chest for full discussion of the chest findings. Electronically Signed   By: Dorise Bullion III M.D   On: 08/30/2019 06:26   DG Chest Portable 1 View  Result Date:  09/01/2019 CLINICAL DATA:  Patient found unresponsive on the toilet, at neck, down for 10 minutes prior CPR, [place] following 5 minutes of CPR EXAM: PORTABLE CHEST 1 VIEW COMPARISON:  Radiograph 11/11/2018 FINDINGS: *Endotracheal tube terminates in the mid trachea, 4.2 cm from the carina. *Transesophageal tube tip is positioned below the level of the GE junction. *Pacer pads overlie the chest. *Telemetry leads overlie the chest. Coarse reticular opacities in the lungs are similar to prior and compatible with patient's known emphysematous changes and chronic bronchitic features. There is some slight patchy opacity in the periphery of the left lung and in adjacent left eighth rib fracture which may be related to resuscitative efforts. Additional age indeterminate right rib seventh fracture is noted. No visible pneumothorax or effusion the portion of the left costophrenic sulcus is collimated from view. Remaining osseous structures and soft tissues are unremarkable. Please note, osseous structures appear diffusely demineralized which may limit detection of small or nondisplaced fractures. IMPRESSION: 1. Support devices in appropriate position, as detailed above. 2. Coarse reticular opacities in the lungs compatible with patient's known emphysematous changes and chronic bronchitic features. 3. Patchy opacity in the periphery of the left lung with adjacent left eighth rib fracture, could reflect some pulmonary contusion in the setting of resuscitation. Alternatively, some underlying infection or inflammation is possible. 4. Additional age-indeterminate right seventh  rib fracture. Electronically Signed   By: Lovena Le M.D.   On: 09/10/2019 03:48    Microbiology Recent Results (from the past 240 hour(s))  Respiratory Panel by RT PCR (Flu A&B, Covid) - Nasopharyngeal Swab     Status: None   Collection Time: 08/24/2019  5:40 AM   Specimen: Nasopharyngeal Swab  Result Value Ref Range Status   SARS Coronavirus 2 by RT PCR NEGATIVE NEGATIVE Final    Comment: (NOTE) SARS-CoV-2 target nucleic acids are NOT DETECTED. The SARS-CoV-2 RNA is generally detectable in upper respiratoy specimens during the acute phase of infection. The lowest concentration of SARS-CoV-2 viral copies this assay can detect is 131 copies/mL. A negative result does not preclude SARS-Cov-2 infection and should not be used as the sole basis for treatment or other patient management decisions. A negative result may occur with  improper specimen collection/handling, submission of specimen other than nasopharyngeal swab, presence of viral mutation(s) within the areas targeted by this assay, and inadequate number of viral copies (<131 copies/mL). A negative result must be combined with clinical observations, patient history, and epidemiological information. The expected result is Negative. Fact Sheet for Patients:  PinkCheek.be Fact Sheet for Healthcare Providers:  GravelBags.it This test is not yet ap proved or cleared by the [place] FDA and  has been authorized for detection and/or diagnosis of SARS-CoV-2 by FDA under an Emergency Use Authorization (EUA). This EUA will remain  in effect (meaning this test can be used) for the duration of the COVID-19 declaration under Section 564(b)(1) of the Act, 21 U.S.C. section 360bbb-3(b)(1), unless the authorization is terminated or revoked sooner.    Influenza A by PCR NEGATIVE NEGATIVE Final   Influenza B by PCR NEGATIVE NEGATIVE Final    Comment: (NOTE) The Xpert Xpress  SARS-CoV-2/FLU/RSV assay is intended as an aid in  the diagnosis of influenza from Nasopharyngeal swab specimens and  should not be used as a sole basis for treatment. Nasal washings and  aspirates are unacceptable for Xpert Xpress SARS-CoV-2/FLU/RSV  testing. Fact Sheet for Patients: PinkCheek.be Fact Sheet for Healthcare Providers: GravelBags.it This test is not yet approved or  cleared by the [place] and  has been authorized for detection and/or diagnosis of SARS-CoV-2 by  FDA under an Emergency Use Authorization (EUA). This EUA will remain  in effect (meaning this test can be used) for the duration of the  Covid-19 declaration under Section 564(b)(1) of the Act, 21  U.S.C. section 360bbb-3(b)(1), unless the authorization is  terminated or revoked. Performed at [facility], [place] [address] [place], [place] [address]   Blood culture (routine x 2)     Status: None   Collection Time: 09/12/2019  6:16 AM   Specimen: BLOOD  Result Value Ref Range Status   Specimen Description BLOOD RIGHT ANTECUBITAL  Final   Special Requests   Final    BOTTLES DRAWN AEROBIC AND ANAEROBIC Blood Culture adequate volume   Culture   Final    NO GROWTH 5 DAYS Performed at [facility], [place] [address], [place], [place] [address]    Report Status 09/07/2019 FINAL  Final  Blood culture (routine x 2)     Status: None   Collection Time: 08/28/2019  6:21 AM   Specimen: BLOOD LEFT ARM  Result Value Ref Range Status   Specimen Description BLOOD LEFT ARM  Final   Special Requests   Final    BOTTLES DRAWN AEROBIC ONLY Blood Culture adequate volume   Culture   Final    NO GROWTH 5 DAYS Performed at [facility], [place] [address], [place], [place] [address]    Report Status 09/07/2019 FINAL  Final  MRSA PCR Screening     Status: None   Collection Time: 08/13/2019  8:09 AM   Specimen: Nasal Mucosa; Nasopharyngeal  Result Value Ref  Range Status   MRSA by PCR NEGATIVE NEGATIVE Final    Comment:        The GeneXpert MRSA Assay (FDA approved for NASAL specimens only), is one component of a comprehensive MRSA colonization surveillance program. It is not intended to diagnose MRSA infection nor to guide or monitor treatment for MRSA infections. Performed at [facility], [place] [address] [place], [place] [address]     Lab Basic Metabolic Panel: No results for input(s): NA, K, CL, CO2, GLUCOSE, BUN, CREATININE, CALCIUM, MG, PHOS in the last 168 hours. Liver Function Tests: No results for input(s): AST, ALT, ALKPHOS, BILITOT, PROT, ALBUMIN in the last 168 hours. No results for input(s): LIPASE, AMYLASE in the last 168 hours. No results for input(s): AMMONIA in the last 168 hours. CBC: No results for input(s): WBC, NEUTROABS, HGB, HCT, MCV, PLT in the last 168 hours. Cardiac Enzymes: No results for input(s): CKTOTAL, CKMB, CKMBINDEX, TROPONINI in the last 168 hours. Sepsis Labs: No results for input(s): PROCALCITON, WBC, LATICACIDVEN in the last 168 hours.  Procedures/Operations  See note   Antonieta Pert 09/10/2019, 2:19 PM

## 2019-09-13 NOTE — Progress Notes (Signed)
Nurse called to the room.  Patient with no raise and fall of chest and no heart rate auscultated.  Nurse Izora Gala also at bedside with Nurse Letta Median to verify death.  Time of death is 31.    [facility] called.  I spoke with Erline Levine with [facility].  She reports that patient is eligible to be an eye an tissue donor.  She reports that the patients eyes will need eye prep prior to going to the morgue.    I have called and made B. Lennox Grumbles patient's attending provider aware of death.   Arby Barrette will sign Death Certificate.    Nurse Izora Gala called and made patient's daughter aware of death.    Patient's daughter [name] at bedside.  [name] reports that she would like for her mother to go to the morgue.  She reports that she will call back and give Patient placement the name of a funeral home.  Patient's daughter given number to follow back up with patient placement.    Post-death check list completed.    Staff nurse Izora Gala to call Medical examiner to confirm if patient will need to be a Medical examiner case.    50cc of IV Hydromorphone wasted in waster recycle container.

## 2019-09-13 DEATH — deceased
